# Patient Record
Sex: Male | Born: 1960 | Race: White | Hispanic: No | Marital: Married | Smoking: Former smoker
Health system: Southern US, Community
[De-identification: ages and names within clinical notes are randomized; demographics above are authoritative.]

## PROBLEM LIST (undated history)

## (undated) DIAGNOSIS — E785 Hyperlipidemia, unspecified: Secondary | ICD-10-CM

## (undated) DIAGNOSIS — K5520 Angiodysplasia of colon without hemorrhage: Secondary | ICD-10-CM

## (undated) DIAGNOSIS — D369 Benign neoplasm, unspecified site: Secondary | ICD-10-CM

## (undated) DIAGNOSIS — L4050 Arthropathic psoriasis, unspecified: Secondary | ICD-10-CM

## (undated) DIAGNOSIS — K219 Gastro-esophageal reflux disease without esophagitis: Secondary | ICD-10-CM

## (undated) DIAGNOSIS — I2510 Atherosclerotic heart disease of native coronary artery without angina pectoris: Secondary | ICD-10-CM

## (undated) DIAGNOSIS — N183 Chronic kidney disease, stage 3 unspecified: Secondary | ICD-10-CM

## (undated) DIAGNOSIS — I209 Angina pectoris, unspecified: Secondary | ICD-10-CM

## (undated) DIAGNOSIS — I10 Essential (primary) hypertension: Secondary | ICD-10-CM

## (undated) DIAGNOSIS — E119 Type 2 diabetes mellitus without complications: Secondary | ICD-10-CM

## (undated) HISTORY — DX: Angiodysplasia of colon without hemorrhage: K55.20

## (undated) HISTORY — DX: Benign neoplasm, unspecified site: D36.9

## (undated) HISTORY — DX: Chronic kidney disease, stage 3 unspecified: N18.30

---

## 2015-02-08 ENCOUNTER — Ambulatory Visit: Payer: Self-pay | Admitting: Family Medicine

## 2015-08-11 ENCOUNTER — Emergency Department
Admission: EM | Admit: 2015-08-11 | Discharge: 2015-08-11 | Disposition: A | Discharge status: Home / Self Care | Payer: BLUE CROSS/BLUE SHIELD | Attending: Emergency Medicine | Admitting: Emergency Medicine

## 2015-08-11 DIAGNOSIS — H00011 Hordeolum externum right upper eyelid: Secondary | ICD-10-CM | POA: Insufficient documentation

## 2015-08-11 DIAGNOSIS — H5711 Ocular pain, right eye: Secondary | ICD-10-CM | POA: Diagnosis present

## 2015-08-11 DIAGNOSIS — H00013 Hordeolum externum right eye, unspecified eyelid: Secondary | ICD-10-CM

## 2015-08-11 MED ORDER — KETOROLAC TROMETHAMINE 0.5 % OP SOLN: 1.0000 [drp] | Freq: Four times a day (QID) | OPHTHALMIC | Status: DC

## 2015-08-11 MED ORDER — BACITRACIN-POLYMYXIN B 500-10000 UNIT/GM OP OINT: TOPICAL_OINTMENT | Freq: Two times a day (BID) | OPHTHALMIC | Status: AC

## 2015-08-11 NOTE — ED Notes (Signed)
Right eye red and swollen since Tuesday . Denies fever.

## 2015-08-11 NOTE — ED Notes (Signed)
Pt reports having stye in eye since Tuesday.  Pt sts that he has had stye in past and usually been treatable w/ ointment.

## 2015-08-11 NOTE — ED Provider Notes (Signed)
[facility] Emergency Department Provider Note  ____________________________________________  Time seen: Approximately 10:56 AM  I have reviewed the triage vital signs and the nursing notes.   HISTORY  Chief Complaint Eye Pain    HPI Jesse Ray is a 55 y.o. male who presents emergency department complaining of right upper eyelid redness, swelling, and a "bump on it." Patient states that he has had multiple of these and they have had to be incised by his ophthalmologist. Patient states that he is been using a warm hot compresses with no relief. Patient states that there is mild blurriness to the right eye but no ocular pain. Patient denies any headaches, fevers or chills.   No past medical history on file.  There are no active problems to display for this patient.   No past surgical history on file.  Current Outpatient Rx  Name  Route  Sig  Dispense  Refill  . bacitracin-polymyxin b (POLYSPORIN) ophthalmic ointment   Right Eye   Place into the right eye 2 (two) times daily. Place a 1/2 inch ribbon of ointment into the eyelid.   3.5 g   0   . ketorolac (ACULAR) 0.5 % ophthalmic solution   Right Eye   Place 1 drop into the right eye 4 (four) times daily.   5 mL   0     Allergies Review of patient's allergies indicates no known allergies.  No family history on file.  Social History Social History  Substance Use Topics  . Smoking status: Not on file  . Smokeless tobacco: Not on file  . Alcohol Use: Not on file     Review of Systems  Constitutional: No fever/chills Eyes: No visual changes. No discharge ENT: No sore throat. Erythema and edema of the right upper eyelid Cardiovascular: no chest pain. Respiratory: no cough. No SOB. Gastrointestinal: No abdominal pain.  No nausea, no vomiting.  No diarrhea.  No constipation. Genitourinary: Negative for dysuria. No hematuria Musculoskeletal: Negative for back pain. Skin: Negative for  rash. Neurological: Negative for headaches, focal weakness or numbness. 10-point ROS otherwise negative.  ____________________________________________   PHYSICAL EXAM:  VITAL SIGNS: ED Triage Vitals  Enc Vitals Group     BP --      Pulse --      Resp 08/11/15 1051 18     Temp --      Temp src --      SpO2 --      Weight 08/11/15 1051 182 lb (82.555 kg)     Height 08/11/15 1051 5\' 7"  (1.702 m)     Head Cir --      Peak Flow --      Pain Score 08/11/15 1050 4     Pain Loc --      Pain Edu? --      Excl. in [place]? --      Constitutional: Alert and oriented. Well appearing and in no acute distress. Eyes: Conjunctivae are normal. PERRL. EOMI. funduscopic exam is unremarkable bilaterally. There is erythema and edema noted to the right upper eyelid. Eyelid is examined and stye is appreciated on the anterior surface of the right upper eyelid. Head: Atraumatic. ENT:      Ears:       Nose: No congestion/rhinnorhea.      Mouth/Throat: Mucous membranes are moist.  Neck: No stridor.   Hematological/Lymphatic/Immunilogical: No cervical lymphadenopathy. Cardiovascular: Normal rate, regular rhythm. Normal S1 and S2.  Good peripheral circulation. Respiratory: Normal respiratory  effort without tachypnea or retractions. Lungs CTAB. Gastrointestinal: Soft and nontender. No distention. No CVA tenderness. Musculoskeletal: No lower extremity tenderness nor edema.  No joint effusions. Neurologic:  Normal speech and language. No gross focal neurologic deficits are appreciated.  Skin:  Skin is warm, dry and intact. No rash noted. Psychiatric: Mood and affect are normal. Speech and behavior are normal. Patient exhibits appropriate insight and judgement.   ____________________________________________   LABS (all labs ordered are listed, but only abnormal results are displayed)  Labs Reviewed - No data to  display ____________________________________________  EKG   ____________________________________________  RADIOLOGY   No results found.  ____________________________________________    PROCEDURES  Procedure(s) performed:       Medications - No data to display   ____________________________________________   INITIAL IMPRESSION / ASSESSMENT AND PLAN / ED COURSE  Pertinent labs & imaging results that were available during my care of the patient were reviewed by me and considered in my medical decision making (see chart for details).  Patient's diagnosis is consistent with a stye to the right upper eyelid. Patient will be placed on antibiotic ointment as well as anti-inflammatory eyedrops. This is to continue until he sees his ophthalmologist for potential procedure..  Patient is to follow up with ophthalmology if symptoms persist past this treatment course. Patient is given ED precautions to return to the ED for any worsening or new symptoms.     ____________________________________________  FINAL CLINICAL IMPRESSION(S) / ED DIAGNOSES  Final diagnoses:  Hordeolum externum (stye), right      NEW MEDICATIONS STARTED DURING THIS VISIT:  New Prescriptions   BACITRACIN-POLYMYXIN B (POLYSPORIN) OPHTHALMIC OINTMENT    Place into the right eye 2 (two) times daily. Place a 1/2 inch ribbon of ointment into the eyelid.   KETOROLAC (ACULAR) 0.5 % OPHTHALMIC SOLUTION    Place 1 drop into the right eye 4 (four) times daily.        Charline Bills Cuthriell, PA-C 08/11/15 [place], MD 08/11/15 1537

## 2015-08-11 NOTE — Discharge Instructions (Signed)
Stye A stye is a bump on your eyelid caused by a bacterial infection. A stye can form inside the eyelid (internal stye) or outside the eyelid (external stye). An internal stye may be caused by an infected oil-producing gland inside your eyelid. An external stye may be caused by an infection at the base of your eyelash (hair follicle). Styes are very common. Anyone can get them at any age. They usually occur in just one eye, but you may have more than one in either eye.  CAUSES  The infection is almost always caused by bacteria called Staphylococcus aureus. This is a common type of bacteria that lives on your skin. RISK FACTORS You may be at higher risk for a stye if you have had one before. You may also be at higher risk if you have:  Diabetes.  Long-term illness.  Long-term eye redness.  A skin condition called seborrhea.  High fat levels in your blood (lipids). SIGNS AND SYMPTOMS  Eyelid pain is the most common symptom of a stye. Internal styes are more painful than external styes. Other signs and symptoms may include:  Painful swelling of your eyelid.  A scratchy feeling in your eye.  Tearing and redness of your eye.  Pus draining from the stye. DIAGNOSIS  Your health care provider may be able to diagnose a stye just by examining your eye. The health care provider may also check to make sure:  You do not have a fever or other signs of a more serious infection.  The infection has not spread to other parts of your eye or areas around your eye. TREATMENT  Most styes will clear up in a few days without treatment. In some cases, you may need to use antibiotic drops or ointment to prevent infection. Your health care provider may have to drain the stye surgically if your stye is:  Large.  Causing a lot of pain.  Interfering with your vision. This can be done using a thin blade or a needle.  HOME CARE INSTRUCTIONS   Take medicines only as directed by your health care  provider.  Apply a clean, warm compress to your eye for 10 minutes, 4 times a day.  Do not wear contact lenses or eye makeup until your stye has healed.  Do not try to pop or drain the stye. SEEK MEDICAL CARE IF:  You have chills or a fever.  Your stye does not go away after several days.  Your stye affects your vision.  Your eyeball becomes swollen, red, or painful. MAKE SURE YOU:  Understand these instructions.  Will watch your condition.  Will get help right away if you are not doing well or get worse.   This information is not intended to replace advice given to you by your health care provider. Make sure you discuss any questions you have with your health care provider.   Document Released: 04/08/2005 Document Revised: 07/20/2014 Document Reviewed: 10/13/2013 Elsevier Interactive Patient Education 2016 [employer]  

## 2015-10-24 ENCOUNTER — Ambulatory Visit
Admission: EM | Admit: 2015-10-24 | Discharge: 2015-10-24 | Disposition: A | Discharge status: Home / Self Care | Payer: BLUE CROSS/BLUE SHIELD | Attending: Family Medicine | Admitting: Family Medicine

## 2015-10-24 ENCOUNTER — Encounter: Payer: Self-pay | Admitting: *Deleted

## 2015-10-24 DIAGNOSIS — Z024 Encounter for examination for driving license: Secondary | ICD-10-CM

## 2015-10-24 DIAGNOSIS — Z029 Encounter for administrative examinations, unspecified: Secondary | ICD-10-CM

## 2015-10-24 HISTORY — DX: Essential (primary) hypertension: I10

## 2015-10-24 HISTORY — DX: Type 2 diabetes mellitus without complications: E11.9

## 2015-10-24 HISTORY — DX: Arthropathic psoriasis, unspecified: L40.50

## 2015-10-24 LAB — DEPT OF TRANSP DIPSTICK, URINE (ARMC ONLY)
Glucose, UA: 100 mg/dL — AB
Hgb urine dipstick: NEGATIVE
PROTEIN: 30 mg/dL — AB
Specific Gravity, Urine: 1.025 (ref 1.005–1.030)

## 2015-10-24 NOTE — ED Notes (Signed)
DOT physical. 

## 2015-10-24 NOTE — ED Provider Notes (Signed)
CSN: [id number]     Arrival date & time 10/24/15  [id number] History   First MD Initiated Contact with Patient 10/24/15 [phone]     Chief Complaint  Patient presents with  . 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 He has several problems including diabetes mellitus with his most recent A1c at 8.6% from 432017. He has history of hypertension psoriatic arthritis kidney stones psoriasis. He denies obstructive sleep apnea, heart problems, or cancer.  Past Medical History  Diagnosis Date  . Hypertension   . Diabetes mellitus without complication ([place])   . Psoriatic arthritis ([place])    History reviewed. No pertinent past surgical history. History reviewed. No pertinent family history. Social History  Substance Use Topics  . Smoking status: Never Smoker   . Smokeless tobacco: None  . Alcohol Use: Yes    Review of Systems  All other systems reviewed and are negative.   Allergies  Review of patient's allergies indicates no known allergies.  Home Medications   Prior to Admission medications   Medication Sig Start Date End Date Taking? Authorizing Provider  Adalimumab (HUMIRA PEN-PSORIASIS STARTER) 40 MG/0.8ML PNKT Inject into the skin.   Yes Historical Provider, MD  ALBUTEROL SULFATE HFA IN Inhale into the lungs.   Yes Historical Provider, MD  amLODipine-valsartan (EXFORGE) 10-320 MG tablet Take 1 tablet by mouth daily.   Yes Historical Provider, MD  aspirin 81 MG tablet Take 81 mg by mouth daily.   Yes Historical Provider, MD  budesonide-formoterol (SYMBICORT) 160-4.5 MCG/ACT inhaler Inhale 2 puffs into the lungs 2 (two) times daily.   Yes Historical Provider, MD  celecoxib (CELEBREX) 200 MG capsule Take 200 mg by mouth 2 (two) times daily.   Yes Historical Provider, MD  glimepiride (AMARYL) 4 MG tablet Take 4 mg by  mouth daily with breakfast.   Yes Historical Provider, MD  Liraglutide (VICTOZA) 18 MG/3ML SOPN Inject into the skin.   Yes Historical Provider, MD  metFORMIN (GLUMETZA) 500 MG (MOD) 24 hr tablet Take 500 mg by mouth daily with breakfast.   Yes Historical Provider, MD  ketorolac (ACULAR) 0.5 % ophthalmic solution Place 1 drop into the right eye 4 (four) times daily. 08/11/15   Charline Bills Cuthriell, PA-C   Meds Ordered and Administered this Visit  Medications - No data to display  BP 124/79 mmHg  Pulse 89  Temp(Src) 97.6 F (36.4 C) (Oral)  Resp 16  Ht 5\' 7"  (1.702 m)  Wt 175 lb (79.379 kg)  BMI 27.40 kg/m2  SpO2 100% No data found.   Physical Exam  Constitutional:  Referred to the DOT physical sheet  Nursing note and vitals reviewed.   ED Course  Procedures (including critical care time)  Labs Review Labs Reviewed  DEPT OF TRANSP DIPSTICK, URINE([facility] ONLY) - Abnormal; Notable for the following:    Protein, ur 30 (*)    Glucose, UA 100 (*)    All other components within normal limits    Imaging Review No results found.   Visual Acuity Review  Right Eye Distance:   Left Eye Distance:   Bilateral Distance:    Right Eye Near:   Left Eye Near:    Bilateral Near:         MDM   1. Driver's permit physical examination        Lorin Picket, Hershal Coria 10/24/15 [id number]

## 2018-11-18 DIAGNOSIS — E1121 Type 2 diabetes mellitus with diabetic nephropathy: Secondary | ICD-10-CM | POA: Insufficient documentation

## 2018-11-18 DIAGNOSIS — IMO0002 Reserved for concepts with insufficient information to code with codable children: Secondary | ICD-10-CM | POA: Insufficient documentation

## 2019-09-15 ENCOUNTER — Ambulatory Visit: Payer: Self-pay

## 2019-09-22 ENCOUNTER — Other Ambulatory Visit: Payer: Self-pay

## 2019-09-22 ENCOUNTER — Ambulatory Visit: Payer: BC Managed Care – PPO | Attending: Internal Medicine

## 2019-09-22 DIAGNOSIS — Z23 Encounter for immunization: Secondary | ICD-10-CM

## 2019-09-22 NOTE — Progress Notes (Signed)
   Covid-19 Vaccination Clinic  Name:  Jesse Ray    MRN: [id number] DOB: Apr 05, 1961  09/22/2019  Jesse Ray was observed post Covid-19 immunization for 15 minutes without incident. He was provided with Vaccine Information Sheet and instruction to access the V-Safe system.   Jesse Ray was instructed to call 911 with any severe reactions post vaccine: [name] Difficulty breathing  . Swelling of face and throat  . A fast heartbeat  . A bad rash all over body  . Dizziness and weakness   Immunizations Administered    Name Date Dose VIS Date Route   Moderna COVID-19 Vaccine 09/22/2019  8:35 AM 0.5 mL 06/13/2019 Intramuscular   Manufacturer: Moderna   Lot: [id number]   [place]: 02111-552-08

## 2019-10-25 ENCOUNTER — Ambulatory Visit: Payer: BC Managed Care – PPO | Attending: Internal Medicine

## 2019-10-25 DIAGNOSIS — Z23 Encounter for immunization: Secondary | ICD-10-CM

## 2019-10-25 NOTE — Progress Notes (Signed)
   Covid-19 Vaccination Clinic  Name:  Kary Colaizzi    MRN: [id number] DOB: 26-Feb-1961  10/25/2019  Mr. Tengan was observed post Covid-19 immunization for 15 minutes without incident. He was provided with Vaccine Information Sheet and instruction to access the V-Safe system.   Mr. Trickel was instructed to call 911 with any severe reactions post vaccine: [name] Difficulty breathing  . Swelling of face and throat  . A fast heartbeat  . A bad rash all over body  . Dizziness and weakness   Immunizations Administered    Name Date Dose VIS Date Route   Moderna COVID-19 Vaccine 10/25/2019  8:10 AM 0.5 mL 06/13/2019 Intramuscular   Manufacturer: Moderna   Lot: [id number]   [place]: [id number]

## 2020-03-04 ENCOUNTER — Other Ambulatory Visit: Payer: Self-pay | Admitting: Rheumatology

## 2020-03-04 DIAGNOSIS — M25512 Pain in left shoulder: Secondary | ICD-10-CM

## 2020-03-22 ENCOUNTER — Other Ambulatory Visit: Payer: Self-pay

## 2020-03-22 ENCOUNTER — Ambulatory Visit
Admission: RE | Admit: 2020-03-22 | Discharge: 2020-03-22 | Disposition: A | Discharge status: Home / Self Care | Payer: Self-pay | Source: Ambulatory Visit | Attending: Cardiovascular Disease | Admitting: Cardiovascular Disease

## 2020-03-22 DIAGNOSIS — Z8249 Family history of ischemic heart disease and other diseases of the circulatory system: Secondary | ICD-10-CM

## 2020-03-22 NOTE — Progress Notes (Signed)
Ok to order per Dr. Rockey Situ. Patients wife sees Dr. Rockey Situ.

## 2020-03-25 ENCOUNTER — Telehealth: Payer: Self-pay | Admitting: Cardiology

## 2020-03-25 DIAGNOSIS — R931 Abnormal findings on diagnostic imaging of heart and coronary circulation: Secondary | ICD-10-CM

## 2020-03-25 DIAGNOSIS — Z8249 Family history of ischemic heart disease and other diseases of the circulatory system: Secondary | ICD-10-CM

## 2020-03-25 NOTE — Telephone Encounter (Signed)
Patients wife calling in regarding the Cardiac Ct results  Please advise

## 2020-03-27 NOTE — Telephone Encounter (Signed)
Spoke with patient and relayed Dr. Donivan Scull result note. Patient stated that he is currently on a statin and baby ASA. He was not sure which statin as he was at work. He thought it started with an R. I mentioned rosuvastatin and he thought that was it. 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 Rockey Situ did not have anything available for another month. Patient wanted to be seen sooner. I scheduled him with Dr. Garen Lah for next week at 0800. I also added a lipid panel to be drawn as the patient had other labs to be drawn next week for his diabetes.

## 2020-03-27 NOTE — Telephone Encounter (Signed)
-----   Message from Minna Merritts, MD sent at 03/27/2020 11:40 AM EDT ----- Coronary calcium score Very high score recorded of 1400, 99th percentile based on his age Calcification noted in all 3 coronary arteries and aorta He can come in for cardiology consultation or talk with primary care, needs aggressive management with low-dose aspirin and statin to achieve goal LDL less than 70 Needs sugars down, A1c 6 or less

## 2020-03-28 ENCOUNTER — Other Ambulatory Visit: Payer: Self-pay

## 2020-03-28 ENCOUNTER — Ambulatory Visit (INDEPENDENT_AMBULATORY_CARE_PROVIDER_SITE_OTHER): Payer: BC Managed Care – PPO | Admitting: Cardiology

## 2020-03-28 ENCOUNTER — Other Ambulatory Visit
Admission: RE | Admit: 2020-03-28 | Discharge: 2020-03-28 | Disposition: A | Discharge status: Home / Self Care | Payer: BC Managed Care – PPO | Source: Ambulatory Visit | Attending: Cardiology | Admitting: Cardiology

## 2020-03-28 ENCOUNTER — Encounter: Payer: Self-pay | Admitting: Cardiology

## 2020-03-28 VITALS — BP 122/74 | HR 82 | Ht 67.0 in | Wt 170.2 lb

## 2020-03-28 DIAGNOSIS — Z87442 Personal history of urinary calculi: Secondary | ICD-10-CM | POA: Insufficient documentation

## 2020-03-28 DIAGNOSIS — L4050 Arthropathic psoriasis, unspecified: Secondary | ICD-10-CM | POA: Insufficient documentation

## 2020-03-28 DIAGNOSIS — I10 Essential (primary) hypertension: Secondary | ICD-10-CM

## 2020-03-28 DIAGNOSIS — Z01812 Encounter for preprocedural laboratory examination: Secondary | ICD-10-CM | POA: Insufficient documentation

## 2020-03-28 DIAGNOSIS — I25118 Atherosclerotic heart disease of native coronary artery with other forms of angina pectoris: Secondary | ICD-10-CM | POA: Diagnosis not present

## 2020-03-28 DIAGNOSIS — T7840XA Allergy, unspecified, initial encounter: Secondary | ICD-10-CM | POA: Insufficient documentation

## 2020-03-28 DIAGNOSIS — Z20822 Contact with and (suspected) exposure to covid-19: Secondary | ICD-10-CM | POA: Insufficient documentation

## 2020-03-28 DIAGNOSIS — E7800 Pure hypercholesterolemia, unspecified: Secondary | ICD-10-CM

## 2020-03-28 DIAGNOSIS — J45909 Unspecified asthma, uncomplicated: Secondary | ICD-10-CM | POA: Insufficient documentation

## 2020-03-28 MED ORDER — ROSUVASTATIN CALCIUM 20 MG PO TABS: 20.0000 mg | ORAL_TABLET | Freq: Every day | ORAL | 5 refills | Status: DC

## 2020-03-28 NOTE — Progress Notes (Signed)
Cardiology Office Note:    Date:  03/28/2020   ID:  Dwan Bolt, DOB 1961-02-04, MRN [id number]  PCP:  Adin Hector, MD  [facility] Cardiologist:  No primary care provider on file.  [facility] Electrophysiologist:  None   Referring MD: No ref. provider found   Chief Complaint  Patient presents with  . New Patient (Initial Visit)    Establish care with provider for elevated coronary calcium score. Medications verbally reviewed with patient.     History of Present Illness:    Jesse Ray is a 59 y.o. male with a hx of diabetes, hypertension, hyperlipidemia who presents due to abnormal coronary calcium score.  Patient underwent coronary calcium score on 03/2020 for restratification.  His score was noted to be elevated at 1424, which was 99th percentile for age and gender matched controls (MESA data).  Patient states having symptoms of chest pressure, which is rates as 3/10, over the past month associated with exertion such as mowing his lawn or working in his yard.  Symptoms resolved with rest.  Also endorses shortness of breath with exertion during this period.  His father had a heart attack age 46.  Past Medical History:  Diagnosis Date  . Diabetes mellitus without complication ([place])   . Hypertension   . Psoriatic arthritis ([place])     History reviewed. No pertinent surgical history.  Current Medications: Current Meds  Medication Sig  . Adalimumab (HUMIRA PEN-PSORIASIS STARTER) 40 MG/0.8ML PNKT Inject 0.4 mLs into the skin every 14 (fourteen) days.   [name] albuterol (VENTOLIN HFA) 108 (90 Base) MCG/ACT inhaler Inhale 2 puffs into the lungs every 6 (six) hours as needed for wheezing or shortness of breath.   [name] ascorbic acid (VITAMIN C) 1000 MG tablet Take 1,000 mg by mouth daily.   [name] aspirin 81 MG tablet Take 81 mg by mouth daily.  . budesonide-formoterol (SYMBICORT) 160-4.5 MCG/ACT inhaler Inhale 2 puffs into the lungs daily as needed (Shotness of breah or  wheezing).   . celecoxib (CELEBREX) 200 MG capsule Take 200 mg by mouth daily.   . Cholecalciferol 25 MCG (1000 UT) tablet Take 1,000 Units by mouth daily.   . clobetasol cream (TEMOVATE) 8.89 % Apply 1 application topically 2 (two) times daily as needed (Psoriasis).   [name] exenatide (BYETTA 10 MCG PEN) 10 MCG/0.04ML SOPN injection Inject into the skin 2 (two) times daily with a meal. 2.4 ml  . Ginkgo Biloba 60 MG CAPS Take 60 mg by mouth in the morning and at bedtime.   [name] glimepiride (AMARYL) 4 MG tablet Take 4 mg by mouth daily with breakfast.  . metFORMIN (GLUCOPHAGE) 1000 MG tablet Take 1,000 mg by mouth 2 (two) times daily with a meal.   . rosuvastatin (CRESTOR) 20 MG tablet Take 1 tablet (20 mg total) by mouth daily.  [name] triamcinolone (NASACORT) 55 MCG/ACT AERO nasal inhaler Place 1 spray into the nose daily as needed (Congestion).   . vitamin E 180 MG (400 UNITS) capsule Take 400 Units by mouth daily.   . Zinc 30 MG TABS Take 30 mg by mouth daily.   . [DISCONTINUED] amLODipine (NORVASC) 10 MG tablet Take by mouth.  . [DISCONTINUED] rosuvastatin (CRESTOR) 20 MG tablet Take by mouth.  . [DISCONTINUED] valsartan (DIOVAN) 320 MG tablet Take by mouth.     Allergies:   Atorvastatin   Social History   Socioeconomic History  . Marital status: Married    Spouse name: Not on file  .  Number of children: Not on file  . Years of education: Not on file  . Highest education level: Not on file  Occupational History  . Not on file  Tobacco Use  . Smoking status: Former Research scientist (life sciences)  . Smokeless tobacco: Never Used  Substance and Sexual Activity  . Alcohol use: Yes  . Drug use: Not on file  . Sexual activity: Not on file  Other Topics Concern  . Not on file  Social History Narrative  . Not on file   Social Determinants of Health   Financial Resource Strain:   . Difficulty of Paying Living Expenses: Not on file  Food Insecurity:   . Worried About Charity fundraiser in the Last Year: Not on  file  . Ran Out of Food in the Last Year: Not on file  Transportation Needs:   . Lack of Transportation (Medical): Not on file  . Lack of Transportation (Non-Medical): Not on file  Physical Activity:   . Days of Exercise per Week: Not on file  . Minutes of Exercise per Session: Not on file  Stress:   . Feeling of Stress : Not on file  Social Connections:   . Frequency of Communication with Friends and Family: Not on file  . Frequency of Social Gatherings with Friends and Family: Not on file  . Attends Religious Services: Not on file  . Active Member of Clubs or Organizations: Not on file  . Attends Archivist Meetings: Not on file  . Marital Status: Not on file     Family History: Father heart and MI at age 2.  ROS:   Please see the history of present illness.     All other systems reviewed and are negative.  EKGs/Labs/Other Studies Reviewed:    The following studies were reviewed today:   EKG:  EKG is  ordered today.  The ekg ordered today demonstrates normal sinus rhythm, normal ECG.  Recent Labs: No results found for requested labs within last 8760 hours.  Recent Lipid Panel No results found for: CHOL, TRIG, HDL, CHOLHDL, VLDL, LDLCALC, LDLDIRECT  Physical Exam:    VS:  BP 122/74 (BP Location: Right Arm, Patient Position: Sitting, Cuff Size: Normal)   Pulse 82   Ht 5\' 7"  (1.702 m)   Wt 170 lb 3.2 oz (77.2 kg)   SpO2 98%   BMI 26.66 kg/m     Wt Readings from Last 3 Encounters:  03/28/20 170 lb 3.2 oz (77.2 kg)  10/24/15 175 lb (79.4 kg)  08/11/15 184 lb (83.5 kg)     GEN:  Well nourished, well developed in no acute distress HEENT: Normal NECK: No JVD; No carotid bruits LYMPHATICS: No lymphadenopathy CARDIAC: RRR, no murmurs, rubs, gallops RESPIRATORY:  Clear to auscultation without rales, wheezing or rhonchi  ABDOMEN: Soft, non-tender, non-distended MUSCULOSKELETAL:  No edema; No deformity  SKIN: Warm and dry NEUROLOGIC:  Alert and oriented  x 3 PSYCHIATRIC:  Normal affect   ASSESSMENT:    1. Coronary artery disease of native artery of native heart with stable angina pectoris ([place])   2. Essential hypertension   3. Pure hypercholesterolemia    PLAN:    In order of problems listed above:  1. Patient with symptoms consistent with angina.  Has a recent coronary calcium scan with a score of 1424, calcifications involving all 3 coronary arteries.  Has risk factors of hypertension, diabetes, family history of early CAD.  Patient is high cardiac risk, and a stress  test/noninvasive test if negative will not change management plan and if positive only confirms pretest probability, which is high.  We will schedule patient for left heart cath.  Get echocardiogram.  Continue aspirin 81 mg, start Crestor 20 mg daily. 2. History of hypertension, BP controlled, continue amlodipine, valsartan. 3. History of hyperlipidemia, get fasting lipid profile.  Start Crestor 20 mg as above.  Follow-up after echo and left heart cath.  Total encounter time 65 minutes  Greater than 50% was spent in counseling and coordination of care with the patient    Medication Adjustments/Labs and Tests Ordered: Current medicines are reviewed at length with the patient today.  Concerns regarding medicines are outlined above.  Orders Placed This Encounter  Procedures  . Lipid panel  . CBC  . Basic metabolic panel  . ECHOCARDIOGRAM COMPLETE   Meds ordered this encounter  Medications  . rosuvastatin (CRESTOR) 20 MG tablet    Sig: Take 1 tablet (20 mg total) by mouth daily.    Dispense:  30 tablet    Refill:  5    Patient Instructions  Medication Instructions:  Your physician has recommended you make the following change in your medication:  START taking rosuvastatin (CRESTOR) 20 MG tablet: Take 1 tablet (20 mg total) by mouth daily.  *If you need a refill on your cardiac medications before your next appointment, please call your pharmacy*   Lab  Work: Today you had a fasting lipid, CBC, and a BMP drawn. You will also need to get a COVID test done at the drive through in front of the [facility]. Hours are 8am-1pm.  If you have labs (blood work) drawn today and your tests are completely normal, you will receive your results only by: [name] MyChart Message (if you have MyChart) OR . A paper copy in the mail If you have any lab test that is abnormal or we need to change your treatment, we will call you to review the results.   Testing/Procedures:  1.  Your physician has requested that you have an echocardiogram. Echocardiography is a painless test that uses sound waves to create images of your heart. It provides your doctor with information about the size and shape of your heart and how well your heart's chambers and valves are working. This procedure takes approximately one hour. There are no restrictions for this procedure.  2. [facility] Cardiac Cath Instructions     You are scheduled for a Cardiac Cath on:__________Mon. 9/20_______________  Please arrive at __0930___am on the day of your procedure  Please expect a call from our [place] to pre-register you  Do not eat/drink anything after midnight  Someone will need to drive you home  It is recommended someone be with you for the first 24 hours after your procedure  Wear clothes that are easy to get on/off and wear slip on shoes if possible   Medications bring a current list of all medications with you.   _XX__ Do not take these medications before your procedure: valsartan (DIOVAN), glimepiride (AMARYL) 4 MG tablet  _XX__ Do not take your metFORMIN (GLUCOPHAGE) 24 hours prior, and 48 hours after your procedure.  Day of your procedure:  Arrive at the [facility] entrance.  Free valet service is available.  After entering the [place] please check-in at the registration desk (1st desk on your right) to receive your armband. After receiving  your armband someone will escort you to the cardiac cath/special procedures waiting area.  The usual length of stay after your procedure is about 2 to 3 hours.  This can vary.  If you have any questions, please call our office at [phone], or you may call the cardiac cath lab at [facility] directly at [phone]    Follow-Up: At [facility], you and your health needs are our priority.  As part of our continuing mission to provide you with exceptional heart care, we have created designated Provider Care Teams.  These Care Teams include your primary Cardiologist (physician) and Advanced Practice Providers (APPs -  Physician Assistants and Nurse Practitioners) who all work together to provide you with the care you need, when you need it.  We recommend signing up for the patient portal called "MyChart".  Sign up information is provided on this After Visit Summary.  MyChart is used to connect with patients for Virtual Visits (Telemedicine).  Patients are able to view lab/test results, encounter notes, upcoming appointments, etc.  Non-urgent messages can be sent to your provider as well.   To learn more about what you can do with MyChart, go to [email or web address].    Your next appointment:   Follow up after Echo and Cath lab procedure   The format for your next appointment:   In Person  Provider:   Kate Sable, MD   Other Instructions      Signed, Kate Sable, MD  03/28/2020 12:57 PM    [place]

## 2020-03-28 NOTE — Patient Instructions (Signed)
Medication Instructions:  Your physician has recommended you make the following change in your medication:  START taking rosuvastatin (CRESTOR) 20 MG tablet: Take 1 tablet (20 mg total) by mouth daily.  *If you need a refill on your cardiac medications before your next appointment, please call your pharmacy*   Lab Work: Today you had a fasting lipid, CBC, and a BMP drawn. You will also need to get a COVID test done at the drive through in front of the [facility]. Hours are 8am-1pm.  If you have labs (blood work) drawn today and your tests are completely normal, you will receive your results only by: [name] MyChart Message (if you have MyChart) OR . A paper copy in the mail If you have any lab test that is abnormal or we need to change your treatment, we will call you to review the results.   Testing/Procedures:  1.  Your physician has requested that you have an echocardiogram. Echocardiography is a painless test that uses sound waves to create images of your heart. It provides your doctor with information about the size and shape of your heart and how well your heart's chambers and valves are working. This procedure takes approximately one hour. There are no restrictions for this procedure.  2. [facility] Cardiac Cath Instructions     You are scheduled for a Cardiac Cath on:__________Mon. 9/20_______________  Please arrive at __0930___am on the day of your procedure  Please expect a call from our [place] to pre-register you  Do not eat/drink anything after midnight  Someone will need to drive you home  It is recommended someone be with you for the first 24 hours after your procedure  Wear clothes that are easy to get on/off and wear slip on shoes if possible   Medications bring a current list of all medications with you.   _XX__ Do not take these medications before your procedure: valsartan (DIOVAN), glimepiride (AMARYL) 4 MG tablet  _XX__ Do not  take your metFORMIN (GLUCOPHAGE) 24 hours prior, and 48 hours after your procedure.  Day of your procedure:  Arrive at the [facility] entrance.  Free valet service is available.  After entering the [place] please check-in at the registration desk (1st desk on your right) to receive your armband. After receiving your armband someone will escort you to the cardiac cath/special procedures waiting area.  The usual length of stay after your procedure is about 2 to 3 hours.  This can vary.  If you have any questions, please call our office at [phone], or you may call the cardiac cath lab at [facility] directly at [phone]    Follow-Up: At [facility], you and your health needs are our priority.  As part of our continuing mission to provide you with exceptional heart care, we have created designated Provider Care Teams.  These Care Teams include your primary Cardiologist (physician) and Advanced Practice Providers (APPs -  Physician Assistants and Nurse Practitioners) who all work together to provide you with the care you need, when you need it.  We recommend signing up for the patient portal called "MyChart".  Sign up information is provided on this After Visit Summary.  MyChart is used to connect with patients for Virtual Visits (Telemedicine).  Patients are able to view lab/test results, encounter notes, upcoming appointments, etc.  Non-urgent messages can be sent to your provider as well.   To learn more about what you can do with MyChart, go to [email or web address].  Your next appointment:   Follow up after Echo and Cath lab procedure   The format for your next appointment:   In Person  Provider:   Kate Sable, MD   Other Instructions

## 2020-03-28 NOTE — H&P (View-Only) (Signed)
Cardiology Office Note:    Date:  03/28/2020   ID:  Dwan Bolt, DOB 03-10-1961, MRN [id number]  PCP:  Adin Hector, MD  [place] Cardiologist:  No primary care provider on file.  [facility] Electrophysiologist:  None   Referring MD: No ref. provider found   Chief Complaint  Patient presents with  . New Patient (Initial Visit)    Establish care with provider for elevated coronary calcium score. Medications verbally reviewed with patient.     History of Present Illness:    Jesse Ray is a 59 y.o. male with a hx of diabetes, hypertension, hyperlipidemia who presents due to abnormal coronary calcium score.  Patient underwent coronary calcium score on 03/2020 for restratification.  His score was noted to be elevated at 1424, which was 99th percentile for age and gender matched controls (MESA data).  Patient states having symptoms of chest pressure, which is rates as 3/10, over the past month associated with exertion such as mowing his lawn or working in his yard.  Symptoms resolved with rest.  Also endorses shortness of breath with exertion during this period.  His father had a heart attack age 80.  Past Medical History:  Diagnosis Date  . Diabetes mellitus without complication ([place])   . Hypertension   . Psoriatic arthritis ([place])     History reviewed. No pertinent surgical history.  Current Medications: Current Meds  Medication Sig  . Adalimumab (HUMIRA PEN-PSORIASIS STARTER) 40 MG/0.8ML PNKT Inject 0.4 mLs into the skin every 14 (fourteen) days.   [name] albuterol (VENTOLIN HFA) 108 (90 Base) MCG/ACT inhaler Inhale 2 puffs into the lungs every 6 (six) hours as needed for wheezing or shortness of breath.   [name] ascorbic acid (VITAMIN C) 1000 MG tablet Take 1,000 mg by mouth daily.   [name] aspirin 81 MG tablet Take 81 mg by mouth daily.  . budesonide-formoterol (SYMBICORT) 160-4.5 MCG/ACT inhaler Inhale 2 puffs into the lungs daily as needed (Shotness of breah or  wheezing).   . celecoxib (CELEBREX) 200 MG capsule Take 200 mg by mouth daily.   . Cholecalciferol 25 MCG (1000 UT) tablet Take 1,000 Units by mouth daily.   . clobetasol cream (TEMOVATE) 5.10 % Apply 1 application topically 2 (two) times daily as needed (Psoriasis).   [name] exenatide (BYETTA 10 MCG PEN) 10 MCG/0.04ML SOPN injection Inject into the skin 2 (two) times daily with a meal. 2.4 ml  . Ginkgo Biloba 60 MG CAPS Take 60 mg by mouth in the morning and at bedtime.   [name] glimepiride (AMARYL) 4 MG tablet Take 4 mg by mouth daily with breakfast.  . metFORMIN (GLUCOPHAGE) 1000 MG tablet Take 1,000 mg by mouth 2 (two) times daily with a meal.   . rosuvastatin (CRESTOR) 20 MG tablet Take 1 tablet (20 mg total) by mouth daily.  [name] triamcinolone (NASACORT) 55 MCG/ACT AERO nasal inhaler Place 1 spray into the nose daily as needed (Congestion).   . vitamin E 180 MG (400 UNITS) capsule Take 400 Units by mouth daily.   . Zinc 30 MG TABS Take 30 mg by mouth daily.   . [DISCONTINUED] amLODipine (NORVASC) 10 MG tablet Take by mouth.  . [DISCONTINUED] rosuvastatin (CRESTOR) 20 MG tablet Take by mouth.  . [DISCONTINUED] valsartan (DIOVAN) 320 MG tablet Take by mouth.     Allergies:   Atorvastatin   Social History   Socioeconomic History  . Marital status: Married    Spouse name: Not on file  .  Number of children: Not on file  . Years of education: Not on file  . Highest education level: Not on file  Occupational History  . Not on file  Tobacco Use  . Smoking status: Former Research scientist (life sciences)  . Smokeless tobacco: Never Used  Substance and Sexual Activity  . Alcohol use: Yes  . Drug use: Not on file  . Sexual activity: Not on file  Other Topics Concern  . Not on file  Social History Narrative  . Not on file   Social Determinants of Health   Financial Resource Strain:   . Difficulty of Paying Living Expenses: Not on file  Food Insecurity:   . Worried About Charity fundraiser in the Last Year: Not on  file  . Ran Out of Food in the Last Year: Not on file  Transportation Needs:   . Lack of Transportation (Medical): Not on file  . Lack of Transportation (Non-Medical): Not on file  Physical Activity:   . Days of Exercise per Week: Not on file  . Minutes of Exercise per Session: Not on file  Stress:   . Feeling of Stress : Not on file  Social Connections:   . Frequency of Communication with Friends and Family: Not on file  . Frequency of Social Gatherings with Friends and Family: Not on file  . Attends Religious Services: Not on file  . Active Member of Clubs or Organizations: Not on file  . Attends Archivist Meetings: Not on file  . Marital Status: Not on file     Family History: Father heart and MI at age 32.  ROS:   Please see the history of present illness.     All other systems reviewed and are negative.  EKGs/Labs/Other Studies Reviewed:    The following studies were reviewed today:   EKG:  EKG is  ordered today.  The ekg ordered today demonstrates normal sinus rhythm, normal ECG.  Recent Labs: No results found for requested labs within last 8760 hours.  Recent Lipid Panel No results found for: CHOL, TRIG, HDL, CHOLHDL, VLDL, LDLCALC, LDLDIRECT  Physical Exam:    VS:  BP 122/74 (BP Location: Right Arm, Patient Position: Sitting, Cuff Size: Normal)   Pulse 82   Ht 5\' 7"  (1.702 m)   Wt 170 lb 3.2 oz (77.2 kg)   SpO2 98%   BMI 26.66 kg/m     Wt Readings from Last 3 Encounters:  03/28/20 170 lb 3.2 oz (77.2 kg)  10/24/15 175 lb (79.4 kg)  08/11/15 184 lb (83.5 kg)     GEN:  Well nourished, well developed in no acute distress HEENT: Normal NECK: No JVD; No carotid bruits LYMPHATICS: No lymphadenopathy CARDIAC: RRR, no murmurs, rubs, gallops RESPIRATORY:  Clear to auscultation without rales, wheezing or rhonchi  ABDOMEN: Soft, non-tender, non-distended MUSCULOSKELETAL:  No edema; No deformity  SKIN: Warm and dry NEUROLOGIC:  Alert and oriented  x 3 PSYCHIATRIC:  Normal affect   ASSESSMENT:    1. Coronary artery disease of native artery of native heart with stable angina pectoris ([place])   2. Essential hypertension   3. Pure hypercholesterolemia    PLAN:    In order of problems listed above:  1. Patient with symptoms consistent with angina.  Has a recent coronary calcium scan with a score of 1424, calcifications involving all 3 coronary arteries.  Has risk factors of hypertension, diabetes, family history of early CAD.  Patient is high cardiac risk, and a stress  test/noninvasive test if negative will not change management plan and if positive only confirms pretest probability, which is high.  We will schedule patient for left heart cath.  Get echocardiogram.  Continue aspirin 81 mg, start Crestor 20 mg daily. 2. History of hypertension, BP controlled, continue amlodipine, valsartan. 3. History of hyperlipidemia, get fasting lipid profile.  Start Crestor 20 mg as above.  Follow-up after echo and left heart cath.  Total encounter time 65 minutes  Greater than 50% was spent in counseling and coordination of care with the patient    Medication Adjustments/Labs and Tests Ordered: Current medicines are reviewed at length with the patient today.  Concerns regarding medicines are outlined above.  Orders Placed This Encounter  Procedures  . Lipid panel  . CBC  . Basic metabolic panel  . ECHOCARDIOGRAM COMPLETE   Meds ordered this encounter  Medications  . rosuvastatin (CRESTOR) 20 MG tablet    Sig: Take 1 tablet (20 mg total) by mouth daily.    Dispense:  30 tablet    Refill:  5    Patient Instructions  Medication Instructions:  Your physician has recommended you make the following change in your medication:  START taking rosuvastatin (CRESTOR) 20 MG tablet: Take 1 tablet (20 mg total) by mouth daily.  *If you need a refill on your cardiac medications before your next appointment, please call your pharmacy*   Lab  Work: Today you had a fasting lipid, CBC, and a BMP drawn. You will also need to get a COVID test done at the drive through in front of [facility]. Hours are 8am-1pm.  If you have labs (blood work) drawn today and your tests are completely normal, you will receive your results only by: [name] MyChart Message (if you have MyChart) OR . A paper copy in the mail If you have any lab test that is abnormal or we need to change your treatment, we will call you to review the results.   Testing/Procedures:  1.  Your physician has requested that you have an echocardiogram. Echocardiography is a painless test that uses sound waves to create images of your heart. It provides your doctor with information about the size and shape of your heart and how well your heart's chambers and valves are working. This procedure takes approximately one hour. There are no restrictions for this procedure.  2. [facility] Cardiac Cath Instructions     You are scheduled for a Cardiac Cath on:__________Mon. 9/20_______________  Please arrive at __0930___am on the day of your procedure  Please expect a call from our [place] to pre-register you  Do not eat/drink anything after midnight  Someone will need to drive you home  It is recommended someone be with you for the first 24 hours after your procedure  Wear clothes that are easy to get on/off and wear slip on shoes if possible   Medications bring a current list of all medications with you.   _XX__ Do not take these medications before your procedure: valsartan (DIOVAN), glimepiride (AMARYL) 4 MG tablet  _XX__ Do not take your metFORMIN (GLUCOPHAGE) 24 hours prior, and 48 hours after your procedure.  Day of your procedure:  Arrive at the [facility] entrance.  Free valet service is available.  After entering the [place] please check-in at the registration desk (1st desk on your right) to receive your armband. After receiving  your armband someone will escort you to the cardiac cath/special procedures waiting area.  The usual length of stay after your procedure is about 2 to 3 hours.  This can vary.  If you have any questions, please call our office at [phone], or you may call the cardiac cath lab at [facility] directly at [phone]    Follow-Up: At [facility], you and your health needs are our priority.  As part of our continuing mission to provide you with exceptional heart care, we have created designated Provider Care Teams.  These Care Teams include your primary Cardiologist (physician) and Advanced Practice Providers (APPs -  Physician Assistants and Nurse Practitioners) who all work together to provide you with the care you need, when you need it.  We recommend signing up for the patient portal called "MyChart".  Sign up information is provided on this After Visit Summary.  MyChart is used to connect with patients for Virtual Visits (Telemedicine).  Patients are able to view lab/test results, encounter notes, upcoming appointments, etc.  Non-urgent messages can be sent to your provider as well.   To learn more about what you can do with MyChart, go to [email or web address].    Your next appointment:   Follow up after Echo and Cath lab procedure   The format for your next appointment:   In Person  Provider:   Kate Sable, MD   Other Instructions      Signed, Kate Sable, MD  03/28/2020 12:57 PM    [place]

## 2020-03-28 NOTE — H&P (View-Only) (Signed)
Cardiology Office Note:    Date:  03/28/2020   ID:  Jesse Ray, DOB 12/03/1960, MRN [id number]  PCP:  Adin Hector, MD  [place] Cardiologist:  No primary care provider on file.  [facility] Electrophysiologist:  None   Referring MD: No ref. provider found   Chief Complaint  Patient presents with  . New Patient (Initial Visit)    Establish care with provider for elevated coronary calcium score. Medications verbally reviewed with patient.     History of Present Illness:    Jesse Ray is a 59 y.o. male with a hx of diabetes, hypertension, hyperlipidemia who presents due to abnormal coronary calcium score.  Patient underwent coronary calcium score on 03/2020 for restratification.  His score was noted to be elevated at 1424, which was 99th percentile for age and gender matched controls (MESA data).  Patient states having symptoms of chest pressure, which is rates as 3/10, over the past month associated with exertion such as mowing his lawn or working in his yard.  Symptoms resolved with rest.  Also endorses shortness of breath with exertion during this period.  His father had a heart attack age 54.  Past Medical History:  Diagnosis Date  . Diabetes mellitus without complication ([place])   . Hypertension   . Psoriatic arthritis ([place])     History reviewed. No pertinent surgical history.  Current Medications: Current Meds  Medication Sig  . Adalimumab (HUMIRA PEN-PSORIASIS STARTER) 40 MG/0.8ML PNKT Inject 0.4 mLs into the skin every 14 (fourteen) days.   [name] albuterol (VENTOLIN HFA) 108 (90 Base) MCG/ACT inhaler Inhale 2 puffs into the lungs every 6 (six) hours as needed for wheezing or shortness of breath.   [name] ascorbic acid (VITAMIN C) 1000 MG tablet Take 1,000 mg by mouth daily.   [name] aspirin 81 MG tablet Take 81 mg by mouth daily.  . budesonide-formoterol (SYMBICORT) 160-4.5 MCG/ACT inhaler Inhale 2 puffs into the lungs daily as needed (Shotness of breah or  wheezing).   . celecoxib (CELEBREX) 200 MG capsule Take 200 mg by mouth daily.   . Cholecalciferol 25 MCG (1000 UT) tablet Take 1,000 Units by mouth daily.   . clobetasol cream (TEMOVATE) 8.46 % Apply 1 application topically 2 (two) times daily as needed (Psoriasis).   [name] exenatide (BYETTA 10 MCG PEN) 10 MCG/0.04ML SOPN injection Inject into the skin 2 (two) times daily with a meal. 2.4 ml  . Ginkgo Biloba 60 MG CAPS Take 60 mg by mouth in the morning and at bedtime.   [name] glimepiride (AMARYL) 4 MG tablet Take 4 mg by mouth daily with breakfast.  . metFORMIN (GLUCOPHAGE) 1000 MG tablet Take 1,000 mg by mouth 2 (two) times daily with a meal.   . rosuvastatin (CRESTOR) 20 MG tablet Take 1 tablet (20 mg total) by mouth daily.  [name] triamcinolone (NASACORT) 55 MCG/ACT AERO nasal inhaler Place 1 spray into the nose daily as needed (Congestion).   . vitamin E 180 MG (400 UNITS) capsule Take 400 Units by mouth daily.   . Zinc 30 MG TABS Take 30 mg by mouth daily.   . [DISCONTINUED] amLODipine (NORVASC) 10 MG tablet Take by mouth.  . [DISCONTINUED] rosuvastatin (CRESTOR) 20 MG tablet Take by mouth.  . [DISCONTINUED] valsartan (DIOVAN) 320 MG tablet Take by mouth.     Allergies:   Atorvastatin   Social History   Socioeconomic History  . Marital status: Married    Spouse name: Not on file  .  Number of children: Not on file  . Years of education: Not on file  . Highest education level: Not on file  Occupational History  . Not on file  Tobacco Use  . Smoking status: Former Research scientist (life sciences)  . Smokeless tobacco: Never Used  Substance and Sexual Activity  . Alcohol use: Yes  . Drug use: Not on file  . Sexual activity: Not on file  Other Topics Concern  . Not on file  Social History Narrative  . Not on file   Social Determinants of Health   Financial Resource Strain:   . Difficulty of Paying Living Expenses: Not on file  Food Insecurity:   . Worried About Charity fundraiser in the Last Year: Not on  file  . Ran Out of Food in the Last Year: Not on file  Transportation Needs:   . Lack of Transportation (Medical): Not on file  . Lack of Transportation (Non-Medical): Not on file  Physical Activity:   . Days of Exercise per Week: Not on file  . Minutes of Exercise per Session: Not on file  Stress:   . Feeling of Stress : Not on file  Social Connections:   . Frequency of Communication with Friends and Family: Not on file  . Frequency of Social Gatherings with Friends and Family: Not on file  . Attends Religious Services: Not on file  . Active Member of Clubs or Organizations: Not on file  . Attends Archivist Meetings: Not on file  . Marital Status: Not on file     Family History: Father heart and MI at age 86.  ROS:   Please see the history of present illness.     All other systems reviewed and are negative.  EKGs/Labs/Other Studies Reviewed:    The following studies were reviewed today:   EKG:  EKG is  ordered today.  The ekg ordered today demonstrates normal sinus rhythm, normal ECG.  Recent Labs: No results found for requested labs within last 8760 hours.  Recent Lipid Panel No results found for: CHOL, TRIG, HDL, CHOLHDL, VLDL, LDLCALC, LDLDIRECT  Physical Exam:    VS:  BP 122/74 (BP Location: Right Arm, Patient Position: Sitting, Cuff Size: Normal)   Pulse 82   Ht 5\' 7"  (1.702 m)   Wt 170 lb 3.2 oz (77.2 kg)   SpO2 98%   BMI 26.66 kg/m     Wt Readings from Last 3 Encounters:  03/28/20 170 lb 3.2 oz (77.2 kg)  10/24/15 175 lb (79.4 kg)  08/11/15 184 lb (83.5 kg)     GEN:  Well nourished, well developed in no acute distress HEENT: Normal NECK: No JVD; No carotid bruits LYMPHATICS: No lymphadenopathy CARDIAC: RRR, no murmurs, rubs, gallops RESPIRATORY:  Clear to auscultation without rales, wheezing or rhonchi  ABDOMEN: Soft, non-tender, non-distended MUSCULOSKELETAL:  No edema; No deformity  SKIN: Warm and dry NEUROLOGIC:  Alert and oriented  x 3 PSYCHIATRIC:  Normal affect   ASSESSMENT:    1. Coronary artery disease of native artery of native heart with stable angina pectoris ([place])   2. Essential hypertension   3. Pure hypercholesterolemia    PLAN:    In order of problems listed above:  1. Patient with symptoms consistent with angina.  Has a recent coronary calcium scan with a score of 1424, calcifications involving all 3 coronary arteries.  Has risk factors of hypertension, diabetes, family history of early CAD.  Patient is high cardiac risk, and a stress  test/noninvasive test if negative will not change management plan and if positive only confirms pretest probability, which is high.  We will schedule patient for left heart cath.  Get echocardiogram.  Continue aspirin 81 mg, start Crestor 20 mg daily. 2. History of hypertension, BP controlled, continue amlodipine, valsartan. 3. History of hyperlipidemia, get fasting lipid profile.  Start Crestor 20 mg as above.  Follow-up after echo and left heart cath.  Total encounter time 65 minutes  Greater than 50% was spent in counseling and coordination of care with the patient    Medication Adjustments/Labs and Tests Ordered: Current medicines are reviewed at length with the patient today.  Concerns regarding medicines are outlined above.  Orders Placed This Encounter  Procedures  . Lipid panel  . CBC  . Basic metabolic panel  . ECHOCARDIOGRAM COMPLETE   Meds ordered this encounter  Medications  . rosuvastatin (CRESTOR) 20 MG tablet    Sig: Take 1 tablet (20 mg total) by mouth daily.    Dispense:  30 tablet    Refill:  5    Patient Instructions  Medication Instructions:  Your physician has recommended you make the following change in your medication:  START taking rosuvastatin (CRESTOR) 20 MG tablet: Take 1 tablet (20 mg total) by mouth daily.  *If you need a refill on your cardiac medications before your next appointment, please call your pharmacy*   Lab  Work: Today you had a fasting lipid, CBC, and a BMP drawn. You will also need to get a COVID test done at the drive through in front of the [facility]. Hours are 8am-1pm.  If you have labs (blood work) drawn today and your tests are completely normal, you will receive your results only by: [name] MyChart Message (if you have MyChart) OR . A paper copy in the mail If you have any lab test that is abnormal or we need to change your treatment, we will call you to review the results.   Testing/Procedures:  1.  Your physician has requested that you have an echocardiogram. Echocardiography is a painless test that uses sound waves to create images of your heart. It provides your doctor with information about the size and shape of your heart and how well your heart's chambers and valves are working. This procedure takes approximately one hour. There are no restrictions for this procedure.  2. [facility] Cardiac Cath Instructions     You are scheduled for a Cardiac Cath on:__________Mon. 9/20_______________  Please arrive at __0930___am on the day of your procedure  Please expect a call from our [place] to pre-register you  Do not eat/drink anything after midnight  Someone will need to drive you home  It is recommended someone be with you for the first 24 hours after your procedure  Wear clothes that are easy to get on/off and wear slip on shoes if possible   Medications bring a current list of all medications with you.   _XX__ Do not take these medications before your procedure: valsartan (DIOVAN), glimepiride (AMARYL) 4 MG tablet  _XX__ Do not take your metFORMIN (GLUCOPHAGE) 24 hours prior, and 48 hours after your procedure.  Day of your procedure:  Arrive at the [facility] entrance.  Free valet service is available.  After entering the [place] please check-in at the registration desk (1st desk on your right) to receive your armband. After receiving  your armband someone will escort you to the cardiac cath/special procedures waiting area.  The usual length of stay after your procedure is about 2 to 3 hours.  This can vary.  If you have any questions, please call our office at [phone], or you may call the cardiac cath lab at [facility] directly at [phone]    Follow-Up: At [facility], you and your health needs are our priority.  As part of our continuing mission to provide you with exceptional heart care, we have created designated Provider Care Teams.  These Care Teams include your primary Cardiologist (physician) and Advanced Practice Providers (APPs -  Physician Assistants and Nurse Practitioners) who all work together to provide you with the care you need, when you need it.  We recommend signing up for the patient portal called "MyChart".  Sign up information is provided on this After Visit Summary.  MyChart is used to connect with patients for Virtual Visits (Telemedicine).  Patients are able to view lab/test results, encounter notes, upcoming appointments, etc.  Non-urgent messages can be sent to your provider as well.   To learn more about what you can do with MyChart, go to [email or web address].    Your next appointment:   Follow up after Echo and Cath lab procedure   The format for your next appointment:   In Person  Provider:   Kate Sable, MD   Other Instructions      Signed, Kate Sable, MD  03/28/2020 12:57 PM    [place]

## 2020-03-29 LAB — BASIC METABOLIC PANEL
BUN/Creatinine Ratio: 19 (ref 9–20)
BUN: 24 mg/dL (ref 6–24)
CO2: 22 mmol/L (ref 20–29)
Calcium: 9.8 mg/dL (ref 8.7–10.2)
Chloride: 104 mmol/L (ref 96–106)
Creatinine, Ser: 1.25 mg/dL (ref 0.76–1.27)
GFR calc Af Amer: 73 mL/min/{1.73_m2} (ref >59)
GFR calc non Af Amer: 64 mL/min/{1.73_m2} (ref >59)
Glucose: 260 mg/dL — ABNORMAL HIGH (ref 65–99)
Potassium: 5.1 mmol/L (ref 3.5–5.2)
Sodium: 141 mmol/L (ref 134–144)

## 2020-03-29 LAB — LIPID PANEL
Chol/HDL Ratio: 4.3 ratio (ref 0.0–5.0)
Cholesterol, Total: 169 mg/dL (ref 100–199)
HDL: 39 mg/dL — ABNORMAL LOW (ref >39)
LDL Chol Calc (NIH): 96 mg/dL (ref 0–99)
Triglycerides: 197 mg/dL — ABNORMAL HIGH (ref 0–149)
VLDL Cholesterol Cal: 34 mg/dL (ref 5–40)

## 2020-03-29 LAB — CBC
Hematocrit: 36.5 % — ABNORMAL LOW (ref 37.5–51.0)
Hemoglobin: 12.3 g/dL — ABNORMAL LOW (ref 13.0–17.7)
MCH: 27.8 pg (ref 26.6–33.0)
MCHC: 33.7 g/dL (ref 31.5–35.7)
MCV: 82 fL (ref 79–97)
Platelets: 337 10*3/uL (ref 150–450)
RBC: 4.43 x10E6/uL (ref 4.14–5.80)
RDW: 12.9 % (ref 11.6–15.4)
WBC: 6.5 10*3/uL (ref 3.4–10.8)

## 2020-03-29 LAB — SARS CORONAVIRUS 2 (TAT 6-24 HRS): SARS Coronavirus 2: NEGATIVE

## 2020-03-29 NOTE — Addendum Note (Signed)
Addended by: Ronaldo Miyamoto on: 03/29/2020 12:55 PM   Modules accepted: Orders

## 2020-04-01 ENCOUNTER — Encounter
Admission: RE | Disposition: A | Discharge status: Home / Self Care | Payer: Self-pay | Source: Home / Self Care | Attending: Cardiovascular Disease

## 2020-04-01 ENCOUNTER — Telehealth: Payer: Self-pay

## 2020-04-01 ENCOUNTER — Ambulatory Visit
Admission: RE | Admit: 2020-04-01 | Discharge: 2020-04-01 | Disposition: A | Discharge status: Home / Self Care | Payer: BC Managed Care – PPO | Attending: Cardiovascular Disease | Admitting: Cardiovascular Disease

## 2020-04-01 ENCOUNTER — Telehealth: Payer: Self-pay | Admitting: Cardiovascular Disease

## 2020-04-01 ENCOUNTER — Other Ambulatory Visit: Payer: Self-pay

## 2020-04-01 ENCOUNTER — Encounter: Payer: Self-pay | Admitting: Cardiovascular Disease

## 2020-04-01 DIAGNOSIS — E119 Type 2 diabetes mellitus without complications: Secondary | ICD-10-CM | POA: Insufficient documentation

## 2020-04-01 DIAGNOSIS — Z87891 Personal history of nicotine dependence: Secondary | ICD-10-CM | POA: Insufficient documentation

## 2020-04-01 DIAGNOSIS — I2510 Atherosclerotic heart disease of native coronary artery without angina pectoris: Secondary | ICD-10-CM | POA: Insufficient documentation

## 2020-04-01 DIAGNOSIS — Z79899 Other long term (current) drug therapy: Secondary | ICD-10-CM | POA: Diagnosis not present

## 2020-04-01 DIAGNOSIS — I10 Essential (primary) hypertension: Secondary | ICD-10-CM | POA: Diagnosis not present

## 2020-04-01 DIAGNOSIS — I25118 Atherosclerotic heart disease of native coronary artery with other forms of angina pectoris: Secondary | ICD-10-CM

## 2020-04-01 DIAGNOSIS — E785 Hyperlipidemia, unspecified: Secondary | ICD-10-CM | POA: Diagnosis not present

## 2020-04-01 DIAGNOSIS — Z8249 Family history of ischemic heart disease and other diseases of the circulatory system: Secondary | ICD-10-CM | POA: Insufficient documentation

## 2020-04-01 DIAGNOSIS — Z7951 Long term (current) use of inhaled steroids: Secondary | ICD-10-CM | POA: Diagnosis not present

## 2020-04-01 DIAGNOSIS — Z791 Long term (current) use of non-steroidal anti-inflammatories (NSAID): Secondary | ICD-10-CM | POA: Diagnosis not present

## 2020-04-01 DIAGNOSIS — Z01812 Encounter for preprocedural laboratory examination: Secondary | ICD-10-CM

## 2020-04-01 DIAGNOSIS — Z7984 Long term (current) use of oral hypoglycemic drugs: Secondary | ICD-10-CM | POA: Insufficient documentation

## 2020-04-01 DIAGNOSIS — E7800 Pure hypercholesterolemia, unspecified: Secondary | ICD-10-CM | POA: Insufficient documentation

## 2020-04-01 HISTORY — PX: LEFT HEART CATH AND CORONARY ANGIOGRAPHY: CATH118249

## 2020-04-01 HISTORY — DX: Hyperlipidemia, unspecified: E78.5

## 2020-04-01 LAB — GLUCOSE, CAPILLARY
Glucose-Capillary: 266 mg/dL — ABNORMAL HIGH (ref 70–99)
Glucose-Capillary: 243 mg/dL — ABNORMAL HIGH (ref 70–99)

## 2020-04-01 SURGERY — LEFT HEART CATH AND CORONARY ANGIOGRAPHY
Anesthesia: Moderate Sedation

## 2020-04-01 MED ORDER — ACETAMINOPHEN 325 MG PO TABS: 650.0000 mg | ORAL_TABLET | ORAL | Status: DC | PRN

## 2020-04-01 MED ORDER — VERAPAMIL HCL 2.5 MG/ML IV SOLN
INTRAVENOUS | Status: DC | PRN
  Administered 2020-04-01 (×2): 2.5 mg via INTRA_ARTERIAL

## 2020-04-01 MED ORDER — CLOPIDOGREL BISULFATE 75 MG PO TABS: 75.0000 mg | ORAL_TABLET | Freq: Every day | ORAL | 1 refills | Status: DC

## 2020-04-01 MED ORDER — SODIUM CHLORIDE 0.9 % WEIGHT BASED INFUSION
3.0000 mL/kg/h | INTRAVENOUS | Status: AC
  Administered 2020-04-01: 3 mL/kg/h via INTRAVENOUS

## 2020-04-01 MED ORDER — HEPARIN SODIUM (PORCINE) 1000 UNIT/ML IJ SOLN
INTRAMUSCULAR | Status: DC | PRN
  Administered 2020-04-01: 4000 [IU] via INTRAVENOUS

## 2020-04-01 MED ORDER — FENTANYL CITRATE (PF) 100 MCG/2ML IJ SOLN
INTRAMUSCULAR | Status: DC | PRN
Start: 2020-04-01 — End: 2020-04-01
  Administered 2020-04-01 (×2): 50 ug via INTRAVENOUS

## 2020-04-01 MED ORDER — FENTANYL CITRATE (PF) 100 MCG/2ML IJ SOLN
INTRAMUSCULAR | Status: AC
  Filled 2020-04-01: qty 2

## 2020-04-01 MED ORDER — SODIUM CHLORIDE 0.9% FLUSH: 3.0000 mL | INTRAVENOUS | Status: DC | PRN

## 2020-04-01 MED ORDER — HEPARIN (PORCINE) IN NACL 1000-0.9 UT/500ML-% IV SOLN
INTRAVENOUS | Status: AC
  Filled 2020-04-01: qty 1000

## 2020-04-01 MED ORDER — SODIUM CHLORIDE 0.9 % WEIGHT BASED INFUSION: 1.0000 mL/kg/h | INTRAVENOUS | Status: DC

## 2020-04-01 MED ORDER — HEPARIN (PORCINE) IN NACL 2000-0.9 UNIT/L-% IV SOLN: INTRAVENOUS | Status: DC | PRN

## 2020-04-01 MED ORDER — SODIUM CHLORIDE 0.9 % IV SOLN: INTRAVENOUS | Status: DC

## 2020-04-01 MED ORDER — HEPARIN (PORCINE) IN NACL 1000-0.9 UT/500ML-% IV SOLN
INTRAVENOUS | Status: DC | PRN
  Administered 2020-04-01: 1000 mL

## 2020-04-01 MED ORDER — SODIUM CHLORIDE 0.9% FLUSH: 3.0000 mL | Freq: Two times a day (BID) | INTRAVENOUS | Status: DC

## 2020-04-01 MED ORDER — LIDOCAINE HCL (PF) 1 % IJ SOLN
INTRAMUSCULAR | Status: AC
  Filled 2020-04-01: qty 30

## 2020-04-01 MED ORDER — MIDAZOLAM HCL 2 MG/2ML IJ SOLN
INTRAMUSCULAR | Status: AC
  Filled 2020-04-01: qty 2

## 2020-04-01 MED ORDER — ONDANSETRON HCL 4 MG/2ML IJ SOLN: 4.0000 mg | Freq: Four times a day (QID) | INTRAMUSCULAR | Status: DC | PRN

## 2020-04-01 MED ORDER — VERAPAMIL HCL 2.5 MG/ML IV SOLN
INTRAVENOUS | Status: AC
  Filled 2020-04-01: qty 2

## 2020-04-01 MED ORDER — MIDAZOLAM HCL 2 MG/2ML IJ SOLN
INTRAMUSCULAR | Status: DC | PRN
  Administered 2020-04-01: 1 mg via INTRAVENOUS

## 2020-04-01 MED ORDER — HEPARIN SODIUM (PORCINE) 1000 UNIT/ML IJ SOLN
INTRAMUSCULAR | Status: AC
  Filled 2020-04-01: qty 1

## 2020-04-01 MED ORDER — SODIUM CHLORIDE 0.9 % IV SOLN: 250.0000 mL | INTRAVENOUS | Status: DC | PRN

## 2020-04-01 MED ORDER — IOHEXOL 300 MG/ML  SOLN
INTRAMUSCULAR | Status: DC | PRN
  Administered 2020-04-01: 60 mL

## 2020-04-01 MED ORDER — LIDOCAINE HCL (PF) 1 % IJ SOLN
INTRAMUSCULAR | Status: DC | PRN
  Administered 2020-04-01: 2 mL

## 2020-04-01 SURGICAL SUPPLY — 8 items
CATH INFINITI 5FR JK (CATHETERS) ×2 IMPLANT
DEVICE RAD TR BAND REGULAR (VASCULAR PRODUCTS) ×2 IMPLANT
GLIDESHEATH SLEND SS 6F .021 (SHEATH) ×2 IMPLANT
GUIDEWIRE INQWIRE 1.5J.035X260 (WIRE) ×1 IMPLANT
INQWIRE 1.5J .035X260CM (WIRE) ×2
KIT MANI 3VAL PERCEP (MISCELLANEOUS) ×2 IMPLANT
PACK CARDIAC CATH (CUSTOM PROCEDURE TRAY) ×2 IMPLANT
WIRE HITORQ VERSACORE ST 145CM (WIRE) ×2 IMPLANT

## 2020-04-01 NOTE — Telephone Encounter (Addendum)
Secure chat received from Dr. Fletcher Anon on 04/01/20    His cardiac cath today showed significant LAD disease in the proximal and mid segment with significant calcifications. Recommend scheduling LAD PCI with IVUS and intravascular lithotripsy at [facility] with me on Wednesday the 29th. He is scheduled for an echocardiogram on the 28th and he should keep that. Send a prescription for Plavix 75 mg once daily. I did discuss this with the patient after the catheterization today and he is aware that we will be getting in touch with him. He should stay off work from now until at least 1 week after PCI.   Patients PCI w/IVUS and intravascular lithotripsy scheduled at [facility] hospital on 04/10/20 @ 11:30am with Dr. Fletcher Anon. 9:30am arrival. Rx for Plaxix 75 mg daily has been sent to the patients pharmacy.  Message fwd to Dr. Thereasa Solo nurse Ralene Muskrat, RN to order labs, COVID test, and provide the patient with pre-procedure instructions.

## 2020-04-01 NOTE — Interval H&P Note (Signed)
Cath Lab Visit (complete for each Cath Lab visit)  Clinical Evaluation Leading to the Procedure:   ACS: No.  Non-ACS:    Anginal Classification: CCS III  Anti-ischemic medical therapy: Minimal Therapy (1 class of medications)  Non-Invasive Test Results: No non-invasive testing performed  Prior CABG: No previous CABG      History and Physical Interval Note:  04/01/2020 10:34 AM  Jesse Ray  has presented today for surgery, with the diagnosis of LT Heart Cath   CAD.  The various methods of treatment have been discussed with the patient and family. After consideration of risks, benefits and other options for treatment, the patient has consented to  Procedure(s): LEFT HEART CATH AND CORONARY ANGIOGRAPHY (N/A) as a surgical intervention.  The patient's history has been reviewed, patient examined, no change in status, stable for surgery.  I have reviewed the patient's chart and labs.  Questions were answered to the patient's satisfaction.     Kathlyn Sacramento

## 2020-04-01 NOTE — Progress Notes (Signed)
Dr. Fletcher Anon at bedside, speaking with pt. Re: cath results. Pt. Verbalized understanding of conversation.

## 2020-04-01 NOTE — Telephone Encounter (Signed)
Called and spoke with patients wife to relay the below information. Also informed them to get a Covid test, bmp and a cbc on Monday 9/27 at [facility]. Below information was also sent through [place] [facility]. Patients wife was very grateful for the phone call.    You are scheduled for a PCI with IVUS and intravascular lithotripsy on Wednesday, September 29 with Dr. Kathlyn Sacramento.  1. Please arrive at the [facility] (Main Entrance A) at [facility]: [address] [place], [place] [address] at 9:30 AM (This time is two hours before your procedure to ensure your preparation). Free valet parking service is available.   Special note: Every effort is made to have your procedure done on time. Please understand that emergencies sometimes delay scheduled procedures.  2. Diet: Do not eat solid foods after midnight.  The patient may have clear liquids until 5am upon the day of the procedure.  3. Labs: You will need to have blood drawn at the medical mall.   4. Medication instructions in preparation for your procedure:  _XX__ Do not take these medications before your procedure: valsartan (DIOVAN), glimepiride (AMARYL) 4 MG tablet  _XX__ Do not take your metFORMIN (GLUCOPHAGE) 24 hours prior, and 48 hours after your procedure.   5. Plan for one night stay--bring personal belongings. 6. Bring a current list of your medications and current insurance cards. 7. You MUST have a responsible person to drive you home. 8. Someone MUST be with you the first 24 hours after you arrive home or your discharge will be delayed. 9. Please wear clothes that are easy to get on and off and wear slip-on shoes.  Thank you for allowing [place] to care for you!   -- [facility]

## 2020-04-01 NOTE — Telephone Encounter (Signed)
Called and spoke with wife. See previous telephone encounter.

## 2020-04-01 NOTE — Telephone Encounter (Signed)
Patient wife calling to schedule procedure at [facility].

## 2020-04-01 NOTE — Telephone Encounter (Signed)
Error encounter. 

## 2020-04-02 ENCOUNTER — Other Ambulatory Visit: Payer: Self-pay | Admitting: Cardiology

## 2020-04-02 DIAGNOSIS — R079 Chest pain, unspecified: Secondary | ICD-10-CM

## 2020-04-03 ENCOUNTER — Telehealth: Payer: Self-pay | Admitting: Cardiology

## 2020-04-03 NOTE — Telephone Encounter (Signed)
[employer] forms received  Placed in nurse box Unsure if forms are for [place] or Fletcher Anon based off cover page

## 2020-04-05 ENCOUNTER — Other Ambulatory Visit: Payer: BC Managed Care – PPO

## 2020-04-08 ENCOUNTER — Other Ambulatory Visit: Payer: Self-pay

## 2020-04-08 ENCOUNTER — Other Ambulatory Visit
Admission: RE | Admit: 2020-04-08 | Discharge: 2020-04-08 | Disposition: A | Discharge status: Home / Self Care | Payer: BC Managed Care – PPO | Source: Ambulatory Visit | Attending: Cardiovascular Disease | Admitting: Cardiovascular Disease

## 2020-04-08 ENCOUNTER — Other Ambulatory Visit
Admission: RE | Admit: 2020-04-08 | Discharge: 2020-04-08 | Disposition: A | Discharge status: Home / Self Care | Payer: BC Managed Care – PPO | Source: Home / Self Care | Attending: Cardiology | Admitting: Cardiology

## 2020-04-08 DIAGNOSIS — Z01812 Encounter for preprocedural laboratory examination: Secondary | ICD-10-CM

## 2020-04-08 DIAGNOSIS — Z20822 Contact with and (suspected) exposure to covid-19: Secondary | ICD-10-CM | POA: Diagnosis not present

## 2020-04-08 LAB — CBC
HCT: 34.8 % — ABNORMAL LOW (ref 39.0–52.0)
Hemoglobin: 11.6 g/dL — ABNORMAL LOW (ref 13.0–17.0)
MCH: 28.2 pg (ref 26.0–34.0)
MCHC: 33.3 g/dL (ref 30.0–36.0)
MCV: 84.5 fL (ref 80.0–100.0)
Platelets: 265 10*3/uL (ref 150–400)
RBC: 4.12 MIL/uL — ABNORMAL LOW (ref 4.22–5.81)
RDW: 12.7 % (ref 11.5–15.5)
WBC: 7.2 10*3/uL (ref 4.0–10.5)
nRBC: 0 % (ref 0.0–0.2)

## 2020-04-08 LAB — BASIC METABOLIC PANEL
Anion gap: 9 (ref 5–15)
BUN: 29 mg/dL — ABNORMAL HIGH (ref 6–20)
CO2: 26 mmol/L (ref 22–32)
Calcium: 9.2 mg/dL (ref 8.9–10.3)
Chloride: 103 mmol/L (ref 98–111)
Creatinine, Ser: 1.07 mg/dL (ref 0.61–1.24)
GFR calc Af Amer: >60 mL/min (ref >60)
GFR calc non Af Amer: >60 mL/min (ref >60)
Glucose, Bld: 255 mg/dL — ABNORMAL HIGH (ref 70–99)
Potassium: 4.5 mmol/L (ref 3.5–5.1)
Sodium: 138 mmol/L (ref 135–145)

## 2020-04-08 LAB — SARS CORONAVIRUS 2 (TAT 6-24 HRS): SARS Coronavirus 2: NEGATIVE

## 2020-04-09 ENCOUNTER — Ambulatory Visit (INDEPENDENT_AMBULATORY_CARE_PROVIDER_SITE_OTHER): Payer: BC Managed Care – PPO

## 2020-04-09 ENCOUNTER — Telehealth: Payer: Self-pay | Admitting: *Deleted

## 2020-04-09 DIAGNOSIS — R079 Chest pain, unspecified: Secondary | ICD-10-CM

## 2020-04-09 LAB — ECHOCARDIOGRAM COMPLETE
AR max vel: 3.62 cm2
AV Area VTI: 4.47 cm2
AV Area mean vel: 4.02 cm2
AV Mean grad: 4 mmHg
AV Peak grad: 12.3 mmHg
Ao pk vel: 1.75 m/s
Area-P 1/2: 3.53 cm2
Calc EF: 56.2 %
S' Lateral: 3.3 cm
Single Plane A2C EF: 57.4 %
Single Plane A4C EF: 53.9 %

## 2020-04-09 NOTE — Telephone Encounter (Signed)
Pt contacted pre-catheterization scheduled at [facility] for: Wednesday April 10, 2020 11:30 AM Verified arrival time and place: [facility]) at:  9:30 AM   No solid food after midnight prior to cath, clear liquids until 5 AM day of procedure.  Hold: Metformin-day of procedure and 48 hours post procedure. Glimepiride-AM of procedure [place]-AM of procedure  Except hold medications AM meds can be  taken pre-cath with sips of water including: ASA 81 mg Plavix 75 mg  Confirmed patient has responsible adult to drive home post procedure and be with patient first 24 hours after arriving home: yes  You are allowed ONE visitor in the waiting room during the time you are at the hospital for your procedure. Both you and your visitor must wear a mask once you enter the hospital.       COVID-19 Pre-Screening Questions:  . In the past 14 days have you had a new cough, new headache, new nasal congestion, fever (100.4 or greater) unexplained body aches, new sore throat, or sudden loss of taste or sense of smell? no . In the past 14 days have you been around anyone with known Covid 19? no . Have you been vaccinated for COVID-19? Yes ,see immunization history  Reviewed procedure/mask/visitor instructions, COVID-19 questions reviewed with patient's wife (DPR) and patient.

## 2020-04-10 ENCOUNTER — Ambulatory Visit (HOSPITAL_COMMUNITY)
Admission: RE | Admit: 2020-04-10 | Discharge: 2020-04-11 | Disposition: A | Discharge status: Home / Self Care | Payer: BC Managed Care – PPO | Attending: Cardiovascular Disease | Admitting: Cardiovascular Disease

## 2020-04-10 ENCOUNTER — Ambulatory Visit (HOSPITAL_COMMUNITY)
Admission: RE | Disposition: A | Discharge status: Home / Self Care | Payer: Self-pay | Source: Home / Self Care | Attending: Cardiovascular Disease

## 2020-04-10 ENCOUNTER — Other Ambulatory Visit: Payer: Self-pay

## 2020-04-10 DIAGNOSIS — I25118 Atherosclerotic heart disease of native coronary artery with other forms of angina pectoris: Secondary | ICD-10-CM | POA: Diagnosis not present

## 2020-04-10 DIAGNOSIS — Z95820 Peripheral vascular angioplasty status with implants and grafts: Secondary | ICD-10-CM

## 2020-04-10 DIAGNOSIS — Z791 Long term (current) use of non-steroidal anti-inflammatories (NSAID): Secondary | ICD-10-CM | POA: Diagnosis not present

## 2020-04-10 DIAGNOSIS — Z87891 Personal history of nicotine dependence: Secondary | ICD-10-CM | POA: Insufficient documentation

## 2020-04-10 DIAGNOSIS — E785 Hyperlipidemia, unspecified: Secondary | ICD-10-CM | POA: Diagnosis present

## 2020-04-10 DIAGNOSIS — E1121 Type 2 diabetes mellitus with diabetic nephropathy: Secondary | ICD-10-CM | POA: Insufficient documentation

## 2020-04-10 DIAGNOSIS — I209 Angina pectoris, unspecified: Secondary | ICD-10-CM | POA: Diagnosis present

## 2020-04-10 DIAGNOSIS — Z7982 Long term (current) use of aspirin: Secondary | ICD-10-CM | POA: Insufficient documentation

## 2020-04-10 DIAGNOSIS — Z7984 Long term (current) use of oral hypoglycemic drugs: Secondary | ICD-10-CM | POA: Diagnosis not present

## 2020-04-10 DIAGNOSIS — I10 Essential (primary) hypertension: Secondary | ICD-10-CM | POA: Diagnosis not present

## 2020-04-10 DIAGNOSIS — Z955 Presence of coronary angioplasty implant and graft: Secondary | ICD-10-CM | POA: Insufficient documentation

## 2020-04-10 DIAGNOSIS — L4050 Arthropathic psoriasis, unspecified: Secondary | ICD-10-CM | POA: Insufficient documentation

## 2020-04-10 DIAGNOSIS — E7800 Pure hypercholesterolemia, unspecified: Secondary | ICD-10-CM | POA: Insufficient documentation

## 2020-04-10 DIAGNOSIS — Z8249 Family history of ischemic heart disease and other diseases of the circulatory system: Secondary | ICD-10-CM | POA: Insufficient documentation

## 2020-04-10 DIAGNOSIS — I25119 Atherosclerotic heart disease of native coronary artery with unspecified angina pectoris: Secondary | ICD-10-CM

## 2020-04-10 DIAGNOSIS — IMO0002 Reserved for concepts with insufficient information to code with codable children: Secondary | ICD-10-CM | POA: Diagnosis present

## 2020-04-10 DIAGNOSIS — Z79899 Other long term (current) drug therapy: Secondary | ICD-10-CM | POA: Insufficient documentation

## 2020-04-10 HISTORY — PX: CORONARY STENT INTERVENTION: CATH118234

## 2020-04-10 HISTORY — DX: Atherosclerotic heart disease of native coronary artery without angina pectoris: I25.10

## 2020-04-10 HISTORY — PX: INTRAVASCULAR ULTRASOUND/IVUS: CATH118244

## 2020-04-10 LAB — GLUCOSE, CAPILLARY
Glucose-Capillary: 204 mg/dL — ABNORMAL HIGH (ref 70–99)
Glucose-Capillary: 158 mg/dL — ABNORMAL HIGH (ref 70–99)
Glucose-Capillary: 178 mg/dL — ABNORMAL HIGH (ref 70–99)
Glucose-Capillary: 316 mg/dL — ABNORMAL HIGH (ref 70–99)

## 2020-04-10 LAB — POCT ACTIVATED CLOTTING TIME
Activated Clotting Time: 329 seconds
Activated Clotting Time: 334 seconds

## 2020-04-10 SURGERY — CORONARY STENT INTERVENTION
Anesthesia: LOCAL

## 2020-04-10 MED ORDER — ONDANSETRON HCL 4 MG/2ML IJ SOLN: 4.0000 mg | Freq: Four times a day (QID) | INTRAMUSCULAR | Status: DC | PRN

## 2020-04-10 MED ORDER — EXENATIDE 10 MCG/0.04ML ~~LOC~~ SOPN: 10.0000 ug | PEN_INJECTOR | Freq: Two times a day (BID) | SUBCUTANEOUS | Status: DC

## 2020-04-10 MED ORDER — SODIUM CHLORIDE 0.9% FLUSH: 3.0000 mL | INTRAVENOUS | Status: DC | PRN

## 2020-04-10 MED ORDER — SODIUM CHLORIDE 0.9 % IV SOLN: 250.0000 mL | INTRAVENOUS | Status: DC | PRN

## 2020-04-10 MED ORDER — CLOPIDOGREL BISULFATE 300 MG PO TABS
ORAL_TABLET | ORAL | Status: AC
  Filled 2020-04-10: qty 1

## 2020-04-10 MED ORDER — ASPIRIN 81 MG PO CHEW: 81.0000 mg | CHEWABLE_TABLET | ORAL | Status: DC

## 2020-04-10 MED ORDER — NITROGLYCERIN 1 MG/10 ML FOR IR/CATH LAB
INTRA_ARTERIAL | Status: DC | PRN
  Administered 2020-04-10: 100 ug via INTRACORONARY

## 2020-04-10 MED ORDER — HEPARIN (PORCINE) IN NACL 1000-0.9 UT/500ML-% IV SOLN
INTRAVENOUS | Status: DC | PRN
  Administered 2020-04-10: 500 mL

## 2020-04-10 MED ORDER — CLOPIDOGREL BISULFATE 300 MG PO TABS
ORAL_TABLET | ORAL | Status: DC | PRN
  Administered 2020-04-10: 300 mg via ORAL

## 2020-04-10 MED ORDER — ADALIMUMAB 40 MG/0.8ML ~~LOC~~ AJKT: 0.4000 mL | AUTO-INJECTOR | SUBCUTANEOUS | Status: DC

## 2020-04-10 MED ORDER — SODIUM CHLORIDE 0.9 % WEIGHT BASED INFUSION
3.0000 mL/kg/h | INTRAVENOUS | Status: DC
  Administered 2020-04-10: 3 mL/kg/h via INTRAVENOUS

## 2020-04-10 MED ORDER — HEPARIN SODIUM (PORCINE) 1000 UNIT/ML IJ SOLN
INTRAMUSCULAR | Status: AC
  Filled 2020-04-10: qty 1

## 2020-04-10 MED ORDER — ACETAMINOPHEN 325 MG PO TABS: 650.0000 mg | ORAL_TABLET | ORAL | Status: DC | PRN

## 2020-04-10 MED ORDER — GLIMEPIRIDE 4 MG PO TABS
4.0000 mg | ORAL_TABLET | Freq: Every day | ORAL | Status: DC
  Administered 2020-04-11: 4 mg via ORAL
  Filled 2020-04-10 (×2): qty 1

## 2020-04-10 MED ORDER — ROSUVASTATIN CALCIUM 20 MG PO TABS
20.0000 mg | ORAL_TABLET | Freq: Every day | ORAL | Status: DC
  Administered 2020-04-10: 20 mg via ORAL
  Filled 2020-04-10: qty 1

## 2020-04-10 MED ORDER — MIDAZOLAM HCL 2 MG/2ML IJ SOLN
INTRAMUSCULAR | Status: DC | PRN
  Administered 2020-04-10 (×2): 1 mg via INTRAVENOUS

## 2020-04-10 MED ORDER — HEPARIN (PORCINE) IN NACL 1000-0.9 UT/500ML-% IV SOLN
INTRAVENOUS | Status: AC
  Filled 2020-04-10: qty 1000

## 2020-04-10 MED ORDER — FENTANYL CITRATE (PF) 100 MCG/2ML IJ SOLN
INTRAMUSCULAR | Status: AC
  Filled 2020-04-10: qty 2

## 2020-04-10 MED ORDER — AMLODIPINE BESYLATE 10 MG PO TABS
10.0000 mg | ORAL_TABLET | Freq: Every day | ORAL | Status: DC
  Administered 2020-04-11: 10 mg via ORAL
  Filled 2020-04-10: qty 1

## 2020-04-10 MED ORDER — AMLODIPINE BESYLATE-VALSARTAN 10-320 MG PO TABS: 1.0000 | ORAL_TABLET | Freq: Every day | ORAL | Status: DC

## 2020-04-10 MED ORDER — ASPIRIN EC 81 MG PO TBEC
81.0000 mg | DELAYED_RELEASE_TABLET | Freq: Every day | ORAL | Status: DC
  Administered 2020-04-11: 81 mg via ORAL
  Filled 2020-04-10: qty 1

## 2020-04-10 MED ORDER — FENTANYL CITRATE (PF) 100 MCG/2ML IJ SOLN
INTRAMUSCULAR | Status: DC | PRN
Start: 2020-04-10 — End: 2020-04-10
  Administered 2020-04-10 (×3): 50 ug via INTRAVENOUS

## 2020-04-10 MED ORDER — VITAMIN D 25 MCG (1000 UNIT) PO TABS
1000.0000 [IU] | ORAL_TABLET | Freq: Every day | ORAL | Status: DC
  Administered 2020-04-11: 1000 [IU] via ORAL
  Filled 2020-04-10: qty 1

## 2020-04-10 MED ORDER — IOHEXOL 350 MG/ML SOLN
INTRAVENOUS | Status: DC | PRN
  Administered 2020-04-10: 155 mL

## 2020-04-10 MED ORDER — NITROGLYCERIN 1 MG/10 ML FOR IR/CATH LAB
INTRA_ARTERIAL | Status: AC
  Filled 2020-04-10: qty 10

## 2020-04-10 MED ORDER — FAMOTIDINE IN NACL 20-0.9 MG/50ML-% IV SOLN
INTRAVENOUS | Status: AC
  Filled 2020-04-10: qty 50

## 2020-04-10 MED ORDER — HEPARIN SODIUM (PORCINE) 1000 UNIT/ML IJ SOLN
INTRAMUSCULAR | Status: DC | PRN
  Administered 2020-04-10: 8000 [IU] via INTRAVENOUS

## 2020-04-10 MED ORDER — CLOPIDOGREL BISULFATE 75 MG PO TABS: 75.0000 mg | ORAL_TABLET | ORAL | Status: DC

## 2020-04-10 MED ORDER — FAMOTIDINE IN NACL 20-0.9 MG/50ML-% IV SOLN
INTRAVENOUS | Status: AC | PRN
  Administered 2020-04-10: 20 mg via INTRAVENOUS

## 2020-04-10 MED ORDER — CLOPIDOGREL BISULFATE 75 MG PO TABS
75.0000 mg | ORAL_TABLET | Freq: Every day | ORAL | Status: DC
  Administered 2020-04-11: 75 mg via ORAL
  Filled 2020-04-10: qty 1

## 2020-04-10 MED ORDER — VITAMIN E 180 MG (400 UNIT) PO CAPS
400.0000 [IU] | ORAL_CAPSULE | Freq: Every day | ORAL | Status: DC
  Administered 2020-04-11: 400 [IU] via ORAL
  Filled 2020-04-10: qty 1

## 2020-04-10 MED ORDER — SODIUM CHLORIDE 0.9% FLUSH
3.0000 mL | Freq: Two times a day (BID) | INTRAVENOUS | Status: DC
  Administered 2020-04-10 – 2020-04-11 (×2): 3 mL via INTRAVENOUS

## 2020-04-10 MED ORDER — SODIUM CHLORIDE 0.9 % WEIGHT BASED INFUSION: 1.0000 mL/kg/h | INTRAVENOUS | Status: AC

## 2020-04-10 MED ORDER — FENTANYL CITRATE (PF) 100 MCG/2ML IJ SOLN
INTRAMUSCULAR | Status: AC
Start: 2020-04-10 — End: ?
  Filled 2020-04-10: qty 2

## 2020-04-10 MED ORDER — IRBESARTAN 150 MG PO TABS
300.0000 mg | ORAL_TABLET | Freq: Every day | ORAL | Status: DC
  Administered 2020-04-11: 300 mg via ORAL
  Filled 2020-04-10: qty 2

## 2020-04-10 MED ORDER — ALBUTEROL SULFATE HFA 108 (90 BASE) MCG/ACT IN AERS
2.0000 | INHALATION_SPRAY | Freq: Four times a day (QID) | RESPIRATORY_TRACT | Status: DC | PRN
  Filled 2020-04-10: qty 6.7

## 2020-04-10 MED ORDER — LORATADINE 10 MG PO TABS
10.0000 mg | ORAL_TABLET | Freq: Every day | ORAL | Status: DC
  Administered 2020-04-11: 10 mg via ORAL
  Filled 2020-04-10: qty 1

## 2020-04-10 MED ORDER — ASCORBIC ACID 500 MG PO TABS
1000.0000 mg | ORAL_TABLET | Freq: Every day | ORAL | Status: DC
  Administered 2020-04-11: 1000 mg via ORAL
  Filled 2020-04-10: qty 2

## 2020-04-10 MED ORDER — TRIAMCINOLONE ACETONIDE 55 MCG/ACT NA AERO
1.0000 | INHALATION_SPRAY | Freq: Every day | NASAL | Status: DC | PRN
  Filled 2020-04-10: qty 10.8

## 2020-04-10 MED ORDER — SODIUM CHLORIDE 0.9% FLUSH
3.0000 mL | Freq: Two times a day (BID) | INTRAVENOUS | Status: DC
  Administered 2020-04-11: 3 mL via INTRAVENOUS

## 2020-04-10 MED ORDER — LIDOCAINE HCL (PF) 1 % IJ SOLN
INTRAMUSCULAR | Status: AC
  Filled 2020-04-10: qty 30

## 2020-04-10 MED ORDER — MIDAZOLAM HCL 2 MG/2ML IJ SOLN
INTRAMUSCULAR | Status: AC
  Filled 2020-04-10: qty 2

## 2020-04-10 MED ORDER — ZINC 30 MG PO TABS: 30.0000 mg | ORAL_TABLET | Freq: Every day | ORAL | Status: DC

## 2020-04-10 MED ORDER — SODIUM CHLORIDE 0.9 % WEIGHT BASED INFUSION: 1.0000 mL/kg/h | INTRAVENOUS | Status: DC

## 2020-04-10 SURGICAL SUPPLY — 22 items
BALLN SAPPHIRE ~~LOC~~ 3.25X12 (BALLOONS) ×2 IMPLANT
BALLN SAPPHIRE ~~LOC~~ 4.0X12 (BALLOONS) ×2 IMPLANT
BALLN WOLVERINE 2.75X15 (BALLOONS) ×2
BALLOON WOLVERINE 2.75X15 (BALLOONS) ×1 IMPLANT
CATH OPTICROSS HD (CATHETERS) ×2 IMPLANT
CATH SHOCKWAVE 3.0X12 (CATHETERS) IMPLANT
CATH VISTA GUIDE 6FR XBLAD4 (CATHETERS) ×2 IMPLANT
CATHETER SHOCKWAVE 3.0X12 (CATHETERS)
DEVICE CLOSURE MYNXGRIP 6/7F (Vascular Products) ×2 IMPLANT
KIT ENCORE 26 ADVANTAGE (KITS) ×2 IMPLANT
KIT HEART LEFT (KITS) ×2 IMPLANT
KIT MICROPUNCTURE NIT STIFF (SHEATH) ×2 IMPLANT
PACK CARDIAC CATHETERIZATION (CUSTOM PROCEDURE TRAY) ×2 IMPLANT
SHEATH PINNACLE 6F 10CM (SHEATH) ×2 IMPLANT
SHEATH PROBE COVER 6X72 (BAG) ×4 IMPLANT
SLED PULL BACK IVUS (MISCELLANEOUS) ×2 IMPLANT
STENT RESOLUTE ONYX 3.0X18 (Permanent Stent) ×2 IMPLANT
STENT RESOLUTE ONYX 4.0X18 (Permanent Stent) ×2 IMPLANT
TRANSDUCER W/STOPCOCK (MISCELLANEOUS) ×2 IMPLANT
TUBING CIL FLEX 10 FLL-RA (TUBING) ×2 IMPLANT
WIRE EMERALD 3MM-J .035X150CM (WIRE) ×2 IMPLANT
WIRE RUNTHROUGH .014X180CM (WIRE) ×2 IMPLANT

## 2020-04-10 NOTE — Interval H&P Note (Signed)
Cath Lab Visit (complete for each Cath Lab visit)  Clinical Evaluation Leading to the Procedure:   ACS: No.  Non-ACS:    Anginal Classification: CCS III  Anti-ischemic medical therapy: Minimal Therapy (1 class of medications)  Non-Invasive Test Results: No non-invasive testing performed  Prior CABG: No previous CABG      History and Physical Interval Note:  04/10/2020 3:00 PM  Jesse Ray  has presented today for surgery, with the diagnosis of CAD.  The various methods of treatment have been discussed with the patient and family. After consideration of risks, benefits and other options for treatment, the patient has consented to  Procedure(s): CORONARY STENT INTERVENTION (N/A) as a surgical intervention.  The patient's history has been reviewed, patient examined, no change in status, stable for surgery.  I have reviewed the patient's chart and labs.  Questions were answered to the patient's satisfaction.     Kathlyn Sacramento

## 2020-04-11 ENCOUNTER — Encounter (HOSPITAL_COMMUNITY): Payer: Self-pay | Admitting: Cardiovascular Disease

## 2020-04-11 DIAGNOSIS — E785 Hyperlipidemia, unspecified: Secondary | ICD-10-CM

## 2020-04-11 DIAGNOSIS — I10 Essential (primary) hypertension: Secondary | ICD-10-CM

## 2020-04-11 DIAGNOSIS — E7800 Pure hypercholesterolemia, unspecified: Secondary | ICD-10-CM

## 2020-04-11 DIAGNOSIS — Z95820 Peripheral vascular angioplasty status with implants and grafts: Secondary | ICD-10-CM

## 2020-04-11 DIAGNOSIS — E119 Type 2 diabetes mellitus without complications: Secondary | ICD-10-CM

## 2020-04-11 DIAGNOSIS — I209 Angina pectoris, unspecified: Secondary | ICD-10-CM | POA: Diagnosis not present

## 2020-04-11 DIAGNOSIS — I2510 Atherosclerotic heart disease of native coronary artery without angina pectoris: Secondary | ICD-10-CM

## 2020-04-11 DIAGNOSIS — I25118 Atherosclerotic heart disease of native coronary artery with other forms of angina pectoris: Secondary | ICD-10-CM | POA: Diagnosis not present

## 2020-04-11 DIAGNOSIS — I2583 Coronary atherosclerosis due to lipid rich plaque: Secondary | ICD-10-CM

## 2020-04-11 HISTORY — DX: Hyperlipidemia, unspecified: E78.5

## 2020-04-11 HISTORY — DX: Peripheral vascular angioplasty status with implants and grafts: Z95.820

## 2020-04-11 LAB — BASIC METABOLIC PANEL
Anion gap: 9 (ref 5–15)
BUN: 18 mg/dL (ref 6–20)
CO2: 25 mmol/L (ref 22–32)
Calcium: 9.2 mg/dL (ref 8.9–10.3)
Chloride: 105 mmol/L (ref 98–111)
Creatinine, Ser: 1.07 mg/dL (ref 0.61–1.24)
GFR calc Af Amer: >60 mL/min (ref >60)
GFR calc non Af Amer: >60 mL/min (ref >60)
Glucose, Bld: 311 mg/dL — ABNORMAL HIGH (ref 70–99)
Potassium: 4.2 mmol/L (ref 3.5–5.1)
Sodium: 139 mmol/L (ref 135–145)

## 2020-04-11 LAB — GLUCOSE, CAPILLARY
Glucose-Capillary: 358 mg/dL — ABNORMAL HIGH (ref 70–99)
Glucose-Capillary: 285 mg/dL — ABNORMAL HIGH (ref 70–99)

## 2020-04-11 LAB — CBC
HCT: 32.1 % — ABNORMAL LOW (ref 39.0–52.0)
Hemoglobin: 10.2 g/dL — ABNORMAL LOW (ref 13.0–17.0)
MCH: 27.1 pg (ref 26.0–34.0)
MCHC: 31.8 g/dL (ref 30.0–36.0)
MCV: 85.4 fL (ref 80.0–100.0)
Platelets: 251 10*3/uL (ref 150–400)
RBC: 3.76 MIL/uL — ABNORMAL LOW (ref 4.22–5.81)
RDW: 12.8 % (ref 11.5–15.5)
WBC: 7.4 10*3/uL (ref 4.0–10.5)
nRBC: 0 % (ref 0.0–0.2)

## 2020-04-11 LAB — HEMOGLOBIN A1C
Hgb A1c MFr Bld: 9 % — ABNORMAL HIGH (ref 4.8–5.6)
Mean Plasma Glucose: 211.6 mg/dL

## 2020-04-11 MED ORDER — ROSUVASTATIN CALCIUM 40 MG PO TABS: 40.0000 mg | ORAL_TABLET | Freq: Every day | ORAL | 11 refills | Status: DC

## 2020-04-11 MED ORDER — ACETAMINOPHEN 325 MG PO TABS: 650.0000 mg | ORAL_TABLET | ORAL | Status: AC | PRN

## 2020-04-11 MED ORDER — NITROGLYCERIN 0.4 MG SL SUBL: 0.4000 mg | SUBLINGUAL_TABLET | SUBLINGUAL | Status: DC | PRN

## 2020-04-11 MED ORDER — METFORMIN HCL 1000 MG PO TABS: 1000.0000 mg | ORAL_TABLET | Freq: Two times a day (BID) | ORAL | Status: DC

## 2020-04-11 MED ORDER — NITROGLYCERIN 0.4 MG SL SUBL: 0.4000 mg | SUBLINGUAL_TABLET | SUBLINGUAL | 4 refills | Status: AC | PRN

## 2020-04-11 MED ORDER — ROSUVASTATIN CALCIUM 20 MG PO TABS: 40.0000 mg | ORAL_TABLET | Freq: Every day | ORAL | Status: DC

## 2020-04-11 MED FILL — Lidocaine HCl Local Preservative Free (PF) Inj 1%: INTRAMUSCULAR | Qty: 30 | Status: AC

## 2020-04-11 NOTE — Progress Notes (Signed)
CARDIAC REHAB PHASE I   PRE:  Rate/Rhythm: 85 SR  BP:  Supine:   Sitting: 140/83  Standing:    SaO2: 97%RA  MODE:  Ambulation: 700 ft   POST:  Rate/Rhythm: 100 SR  BP:  Supine:   Sitting: 161/81  Standing:    SaO2: 98%RA 0805-0855 Pt walked 700 ft on RA with steady gait and no CP. Did stated he felt a little SOB but sats good on RA. Education completed with pt who voiced understanding. Stressed importance of plavix with stent. Reviewed NTG use, heart healthy and low carb food choices, walking for ex, and CRP 2. Referred to [place] CRP 2. Gave diabetic and heart healthy diets. To recliner after walk.   Graylon Good, RN BSN  04/11/2020 8:51 AM

## 2020-04-11 NOTE — Discharge Summary (Addendum)
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  2nd Mrg lesion is 40% stenosed.  Prox RCA lesion is 85% stenosed.  Ost LAD lesion is 40% stenosed.  2nd Diag lesion is 60% stenosed.  Post intervention, there is a 0% residual stenosis.  A drug-eluting stent was successfully placed using a STENT RESOLUTE ONYX 3.0X18.  Dist LAD-1 lesion is 30% stenosed.  Dist LAD-2 lesion is 70% stenosed.  Prox LAD lesion is 85% stenosed.  Post intervention, there is a 0% residual stenosis.  A drug-eluting stent was successfully placed using a STENT RESOLUTE ONYX 4.0X18.   Successful IVUS, angioplasty and drug-eluting stent placement to the mid and proximal LAD using 2 nonoverlapped stents.  Overall difficult procedure due to calcifications.  Intravascular lithotripsy was attempted but had to be aborted due to malfunction in the device which did not resolve after exchanging the catheter.  Recommendations: Dual antiplatelet therapy for at least 6 months but preferably long-term as tolerated given his residual disease. The patient requires aggressive medical therapy as he has diffuse calcified disease.  Diagnostic Dominance: Left  Intervention    _____________   History of  Present Illness     Reo Portela is a 59 y.o. male with hx of DM-2, HTN and calcium score of 1424 on Ca+ score CT and did note symptoms of chest pressure 3/10 with exertion of mowing grass.  DOE as well.  + premature FH of CAD with father MI at 36.  Pt was seen by Dr. Garen Lah and concern with ca+ score elevation, HTN, DM-2 and premature FH CAD pt was scheduled for cardiac cath.  Pt was agreeable.  Also with hx of HLD.     Cath prior to this admit 04/01/20 with Left dominant coronary arteries with significant one-vessel coronary artery disease involving the LAD.  The LAD has significant proximal and mid disease with significant calcifications.  The ostial LAD has moderate stenosis.  There is also significant proximal RCA stenosis but the vessel is small and nondominant.   Severe catheter induced radial artery spasm with inability to advance the catheter at the left ventricle for left ventricular angiography.  Best option for revascularization is likely IVUS guided PCI of the LAD likely with atherectomy or intravascular lithotripsy, with that plan patient was placed on dual antiplatelet therapy and plan for PCI at [facility].  Echo done prior to this admit with EF 65-70%.  No RWMA. And valves stable.   Presented to [facility] cath lab 04/10/20 for PCI.  See above.  Hospital Course     Consultants: none   Post procedure pt has done well.  No complications and this AM no chest pain or SOB.  He has ambulated with cardiac rehab without complications.  He still has residual disease of pRCA. Small non dominant  vessel- will treat medically.  DAPT fro 12 months.  Continue high dose statin.    Glucose is elevated will check A1c here and follow up at visit.  Will hold metformin for 48 hours then resume, but back on amaryl and byetta now. Will increase crestor as well.    EKG was SR and normal.   hgb A1C is 9 - have asked pt to see PCP in a week or so for improved management now with CAD.   Pt was seen and  evaluated by Dr. Radford Pax and found stable for discharge.  Has follow up with Dr. Synthia Innocent.  Did the patient have an acute coronary syndrome (MI, NSTEMI, STEMI, etc) this admission?:  No                               Did the patient have a percutaneous coronary intervention (stent / angioplasty)?:  Yes.     Cath/PCI Registry Performance & Quality Measures: 1. Aspirin prescribed? - Yes 2. ADP Receptor Inhibitor (Plavix/Clopidogrel, Brilinta/Ticagrelor or Effient/Prasugrel) prescribed (includes medically managed patients)? - Yes 3. High Intensity Statin (Lipitor 40-80mg  or Crestor 20-40mg ) prescribed? - Yes 4. For EF <40%, was ACEI/ARB prescribed? - Not Applicable (EF >/= 28%) 5. For EF <40%, Aldosterone Antagonist (Spironolactone or Eplerenone) prescribed? - Not Applicable (EF >/= 31%) 6. Cardiac Rehab Phase II ordered? - Yes   _____________  Discharge Vitals Blood pressure 140/83, pulse 79, temperature 97.8 F (36.6 C), temperature source Oral, resp. rate 17, height 5\' 7"  (1.702 m), weight 75.4 kg, SpO2 97 %.  Filed Weights   04/10/20 2108  Weight: 75.4 kg    Labs & Radiologic Studies    CBC Recent Labs    04/11/20 0213  WBC 7.4  HGB 10.2*  HCT 32.1*  MCV 85.4  PLT 517   Basic Metabolic Panel Recent Labs    04/11/20 0213  NA 139  K 4.2  CL 105  CO2 25  GLUCOSE 311*  BUN 18  CREATININE 1.07  CALCIUM 9.2   Liver Function Tests No results for input(s): AST, ALT, ALKPHOS, BILITOT, PROT, ALBUMIN in the last 72 hours. No results for input(s): LIPASE, AMYLASE in the last 72 hours. High Sensitivity Troponin:   No results for input(s): TROPONINIHS in the last 720 hours.  BNP Invalid input(s): POCBNP D-Dimer No results for input(s): DDIMER in the last 72 hours. Hemoglobin A1C No results for input(s): HGBA1C in the last 72 hours. Fasting Lipid Panel No results for input(s): CHOL, HDL, LDLCALC, TRIG, CHOLHDL, LDLDIRECT in the last 72 hours. Thyroid Function Tests No  results for input(s): TSH, T4TOTAL, T3FREE, THYROIDAB in the last 72 hours.  Invalid input(s): FREET3 _____________  CARDIAC CATHETERIZATION  Result Date: 04/10/2020  Mid LAD lesion is 85% stenosed.  2nd Mrg lesion is 40% stenosed.  Prox RCA lesion is 85% stenosed.  Ost LAD lesion is 40% stenosed.  2nd Diag lesion is 60% stenosed.  Post intervention, there is a 0% residual stenosis.  A drug-eluting stent was successfully placed using a STENT RESOLUTE ONYX 3.0X18.  Dist LAD-1 lesion is 30% stenosed.  Dist LAD-2 lesion is 70% stenosed.  Prox LAD lesion is 85% stenosed.  Post intervention, there is a 0% residual stenosis.  A drug-eluting stent was successfully placed using a STENT RESOLUTE ONYX 4.0X18.  Successful IVUS, angioplasty and drug-eluting stent placement to the mid and proximal LAD using 2 nonoverlapped stents.  Overall difficult procedure  due to calcifications.  Intravascular lithotripsy was attempted but had to be aborted due to malfunction in the device which did not resolve after exchanging the catheter. Recommendations: Dual antiplatelet therapy for at least 6 months but preferably long-term as tolerated given his residual disease. The patient requires aggressive medical therapy as he has diffuse calcified disease.   CARDIAC CATHETERIZATION  Result Date: 04/01/2020  Prox RCA lesion is 85% stenosed.  2nd Mrg lesion is 40% stenosed.  Ost LAD to Prox LAD lesion is 40% stenosed.  Prox LAD lesion is 85% stenosed.  Mid LAD lesion is 85% stenosed.  1.  Left dominant coronary arteries with significant one-vessel coronary artery disease involving the LAD.  The LAD has significant proximal and mid disease with significant calcifications.  The ostial LAD has moderate stenosis.  There is also significant proximal RCA stenosis but the vessel is small and nondominant. 2.  Severe catheter induced radial artery spasm with inability to advance the catheter at the left ventricle for left  ventricular angiography. Recommendations: Recommend aggressive medical therapy. Best option for revascularization is likely IVUS guided PCI of the LAD likely with atherectomy or intravascular lithotripsy. We will go ahead and start the patient on dual antiplatelet therapy and plan for PCI at [facility]. Avoid radial access given severe radial artery spasm. 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 He should get an echocardiogram done first.   CT CARDIAC SCORING  Addendum Date: 03/25/2020   ADDENDUM REPORT: 03/25/2020 17:58 CLINICAL DATA:  Risk stratification EXAM: Coronary Calcium Score TECHNIQUE: The patient was scanned on a Siemens go.Top Scanner. Axial non-contrast 3 mm slices were carried out through the heart. The data set was analyzed on a dedicated work station and scored using the [place]. FINDINGS: Non-cardiac: See separate report from [facility]. Ascending Aorta: Normal size Pericardium: Normal Coronary arteries: Normal origin of left and right coronary arteries. Distribution of arterial calcifications if present, as noted below; LM 0 LAD 1191 LCx 103 RCA 130 Total 1424 IMPRESSION: 1. High Coronary calcium score of 1424 . This was 99th percentile for age and sex matched control. 2. Coronary calcifications involving all 3 coronary branches 3. Recommend asa and statin if no contraindications. Kate Sable Electronically Signed   By: Kate Sable M.D.   On: 03/25/2020 17:58   Result Date: 03/25/2020 EXAM: OVER-READ INTERPRETATION  CT CHEST The following report is an over-read performed by radiologist Dr. Vinnie Langton of [facility], [place] on 03/22/2020. This over-read does not include interpretation of cardiac or coronary anatomy or pathology. The coronary calcium score interpretation by the cardiologist is attached. COMPARISON:  None. FINDINGS: Aortic atherosclerosis. Mosaic attenuation throughout the lung parenchyma suggesting air  trapping within the visualized portions of the thorax there are no suspicious appearing pulmonary nodules or masses, there is no acute consolidative airspace disease, no pleural effusions, no pneumothorax and no lymphadenopathy. Visualized portions of the upper abdomen are unremarkable. There are no aggressive appearing lytic or blastic lesions noted in the visualized portions of the skeleton. IMPRESSION: 1.  Aortic Atherosclerosis (ICD10-I70.0). 2. Probable air trapping in the lungs, suggestive of small airways disease. Electronically Signed: By: Vinnie Langton M.D. On: 03/22/2020 16:25   ECHOCARDIOGRAM COMPLETE  Result Date: 04/09/2020    ECHOCARDIOGRAM REPORT   Patient Name:   HENDRIX YURKOVICH Date of Exam: 04/09/2020 Medical Rec #:  [id number]        Height:       52.0  in Accession #:    [id number]       Weight:       164.0 lb Date of Birth:  1960/11/26         BSA:          1.859 m Patient Age:    39 years         BP:           126/72 mmHg Patient Gender: M                HR:           84 bpm. Exam Location:  [place] Procedure: 2D Echo, Cardiac Doppler and Color Doppler Indications:    R07.9* Chest pain, unspecified  History:        Patient has no prior history of Echocardiogram examinations.                 CAD, Signs/Symptoms:Chest Pain; Risk Factors:Diabetes,                 Hypertension and Former Smoker. Asthma.  Sonographer:    Pilar Jarvis RDMS, RVT, RDCS Referring Phys: [id number] BRIAN AGBOR-ETANG IMPRESSIONS  1. Left ventricular ejection fraction, by estimation, is 65 to 70%. The left ventricle has normal function. The left ventricle has no regional wall motion abnormalities. Left ventricular diastolic parameters were normal.  2. Right ventricular systolic function is normal. The right ventricular size is normal. Tricuspid regurgitation signal is inadequate for assessing PA pressure.  3. The mitral valve is normal in structure. No evidence of mitral valve regurgitation. No evidence of mitral  stenosis.  4. The aortic valve is tricuspid. Aortic valve regurgitation is not visualized. No aortic stenosis is present.  5. The inferior vena cava is normal in size with greater than 50% respiratory variability, suggesting right atrial pressure of 3 mmHg. FINDINGS  Left Ventricle: Left ventricular ejection fraction, by estimation, is 65 to 70%. The left ventricle has normal function. The left ventricle has no regional wall motion abnormalities. The left ventricular internal cavity size was normal in size. There is  borderline left ventricular hypertrophy. Left ventricular diastolic parameters were normal. Right Ventricle: The right ventricular size is normal. No increase in right ventricular wall thickness. Right ventricular systolic function is normal. Tricuspid regurgitation signal is inadequate for assessing PA pressure. Left Atrium: Left atrial size was normal in size. Right Atrium: Right atrial size was normal in size. Pericardium: There is no evidence of pericardial effusion. Mitral Valve: The mitral valve is normal in structure. No evidence of mitral valve regurgitation. No evidence of mitral valve stenosis. Tricuspid Valve: The tricuspid valve is normal in structure. Tricuspid valve regurgitation is trivial. Aortic Valve: The aortic valve is tricuspid. Aortic valve regurgitation is not visualized. No aortic stenosis is present. Aortic valve mean gradient measures 4.0 mmHg. Aortic valve peak gradient measures 12.2 mmHg. Aortic valve area, by VTI measures 4.47  cm. Pulmonic Valve: The pulmonic valve was normal in structure. Pulmonic valve regurgitation is not visualized. No evidence of pulmonic stenosis. Aorta: The aortic root and ascending aorta are structurally normal, with no evidence of dilitation. Pulmonary Artery: The pulmonary artery is of normal size. Venous: The inferior vena cava is normal in size with greater than 50% respiratory variability, suggesting right atrial pressure of 3 mmHg.  IAS/Shunts: No atrial level shunt detected by color flow Doppler.  LEFT VENTRICLE PLAX 2D LVIDd:         4.50 cm  Diastology LVIDs:         3.30 cm     LV e' medial:    9.14 cm/s LV PW:         1.20 cm     LV E/e' medial:  10.8 LV IVS:        1.00 cm     LV e' lateral:   7.07 cm/s LVOT diam:     2.40 cm     LV E/e' lateral: 14.0 LV SV:         109 LV SV Index:   59 LVOT Area:     4.52 cm  LV Volumes (MOD) LV vol d, MOD A2C: 58.7 ml LV vol d, MOD A4C: 72.4 ml LV vol s, MOD A2C: 25.0 ml LV vol s, MOD A4C: 33.4 ml LV SV MOD A2C:     33.7 ml LV SV MOD A4C:     72.4 ml LV SV MOD BP:      37.9 ml RIGHT VENTRICLE             IVC RV Basal diam:  4.20 cm     IVC diam: 1.90 cm RV S prime:     16.40 cm/s TAPSE (M-mode): 2.9 cm LEFT ATRIUM             Index       RIGHT ATRIUM           Index LA diam:        3.00 cm 1.61 cm/m  RA Area:     13.80 cm LA Vol (A2C):   30.1 ml 16.19 ml/m RA Volume:   34.60 ml  18.62 ml/m LA Vol (A4C):   43.2 ml 23.24 ml/m LA Biplane Vol: 40.1 ml 21.57 ml/m  AORTIC VALVE                   PULMONIC VALVE AV Area (Vmax):    3.62 cm    PV Vmax:       1.08 m/s AV Area (Vmean):   4.02 cm    PV Peak grad:  4.7 mmHg AV Area (VTI):     4.47 cm AV Vmax:           175.00 cm/s AV Vmean:          93.300 cm/s AV VTI:            0.245 m AV Peak Grad:      12.2 mmHg AV Mean Grad:      4.0 mmHg LVOT Vmax:         140.00 cm/s LVOT Vmean:        82.900 cm/s LVOT VTI:          0.242 m LVOT/AV VTI ratio: 0.99  AORTA Ao Root diam: 3.00 cm Ao Asc diam:  3.10 cm Ao Arch diam: 3.0 cm MITRAL VALVE MV Area (PHT): 3.53 cm    SHUNTS MV Decel Time: 215 msec    Systemic VTI:  0.24 m MV E velocity: 99.00 cm/s  Systemic Diam: 2.40 cm MV A velocity: 88.70 cm/s MV E/A ratio:  1.12 Harrell Gave End MD Electronically signed by Nelva Bush MD Signature Date/Time: 04/09/2020/6:21:37 PM    Final    Disposition   Pt is being discharged home today in good condition.  Follow-up Plans & Appointments   Take 1 NTG, under  your tongue, while sitting.  If no relief of pain may repeat NTG, one tab every 5 minutes up to  3 tablets total over 15 minutes.  If no relief CALL 911.  If you have dizziness/lightheadness  while taking NTG, stop taking and call 911.        Heart Healthy Diabetic Diet   Call [facility] at [phone] if any bleeding, swelling or drainage at cath site.  May shower, no tub baths for 48 hours for groin sticks. No lifting over 5 pounds for 3 days.  No Driving for 3 days  Do not take Metformin until 04/13/20 - it may interact with cath dye.    Do not miss asprin or plavix they keep the stent open  If you bruise a lot may need to stop ginko and Vit E but not the plavix or asprin.       Follow-up Information    Kate Sable, MD Follow up on 04/19/2020.   Specialties: Cardiology, Radiology Why: at 11:40 AM Contact information: [place] [address] [phone]        Adin Hector, MD. Call.   Specialty: Internal Medicine Why: call to make an appt should be seen for Diabetes management with new heart disease  Contact information: [place] [address] [phone]              Discharge Instructions    Amb Referral to Cardiac Rehabilitation   Complete by: As directed    Diagnosis: Coronary Stents   After initial evaluation and assessments completed: Virtual Based Care may be provided alone or in conjunction with Phase 2 Cardiac Rehab based on patient barriers.: Yes      Discharge Medications   Allergies as of 04/11/2020      Reactions   Atorvastatin    Other reaction(s): Muscle Pain      Medication List    STOP taking these medications   celecoxib 200 MG capsule Commonly known as: CELEBREX     TAKE these medications   acetaminophen 325 MG tablet Commonly known as: TYLENOL Take 2 tablets (650 mg total) by mouth every 4 (four) hours as needed for headache or mild  pain.   albuterol 108 (90 Base) MCG/ACT inhaler Commonly known as: VENTOLIN HFA Inhale 2 puffs into the lungs every 6 (six) hours as needed for wheezing or shortness of breath.   Allergy Rel Child (Cetirizine) 5 MG/5ML Soln Generic drug: cetirizine HCl Take 5 mg by mouth daily.   cetirizine 5 MG tablet Commonly known as: ZYRTEC Take 5 mg by mouth daily.   amLODipine-valsartan 10-320 MG tablet Commonly known as: EXFORGE Take 1 tablet by mouth daily.   ascorbic acid 1000 MG tablet Commonly known as: VITAMIN C Take 1,000 mg by mouth daily.   aspirin 81 MG tablet Take 81 mg by mouth daily.   budesonide-formoterol 160-4.5 MCG/ACT inhaler Commonly known as: SYMBICORT Inhale 2 puffs into the lungs daily as needed (Shortness of breath or wheezing).   Byetta 10 MCG Pen 10 MCG/0.04ML Sopn injection Generic drug: exenatide Inject into the skin 2 (two) times daily with a meal. 2.4 ml   Cholecalciferol 25 MCG (1000 UT) tablet Take 1,000 Units by mouth daily.   clobetasol cream 0.05 % Commonly known as: TEMOVATE Apply 1 application topically 2 (two) times daily as needed (Psoriasis).   clopidogrel 75 MG tablet Commonly known as: PLAVIX Take 1 tablet (75 mg total) by mouth daily.   Ginkgo Biloba 60 MG Caps Take 60 mg by mouth in the morning and  at bedtime.   glimepiride 4 MG tablet Commonly known as: AMARYL Take 4 mg by mouth daily with breakfast.   Humira Pen-Ps/UV/Adol HS Start 40 MG/0.8ML Pnkt Generic drug: Adalimumab Inject 0.4 mLs into the skin every 14 (fourteen) days.   KRILL OIL PO Take 325 mg by mouth daily.   metFORMIN 1000 MG tablet Commonly known as: GLUCOPHAGE Take 1 tablet (1,000 mg total) by mouth 2 (two) times daily with a meal. Start taking on: April 13, 2020 What changed: These instructions start on April 13, 2020. If you are unsure what to do until then, ask your doctor or other care provider.   nitroGLYCERIN 0.4 MG SL tablet Commonly known as:  NITROSTAT Place 1 tablet (0.4 mg total) under the tongue every 5 (five) minutes as needed for chest pain.   rosuvastatin 40 MG tablet Commonly known as: CRESTOR Take 1 tablet (40 mg total) by mouth at bedtime. What changed:   medication strength  how much to take  when to take this   triamcinolone 55 MCG/ACT Aero nasal inhaler Commonly known as: NASACORT Place 1 spray into the nose daily as needed (Congestion).   vitamin E 180 MG (400 UNITS) capsule Take 400 Units by mouth daily.   Zinc 30 MG Tabs Take 30 mg by mouth daily.          Outstanding Labs/Studies   Recheck Hepatic and lipid in 6 weeks.  Duration of Discharge Encounter   Greater than 30 minutes including physician time.  Signed, Cecilie Kicks, NP 04/11/2020, 10:50 AM

## 2020-04-11 NOTE — Progress Notes (Signed)
Progress Note  Patient Name: Jesse Ray Date of Encounter: 04/11/2020  Primary Cardiologist: No primary care provider on file.   Subjective   Doing well s/p PCI of the LAD yesterday.  Denies any chest pain or SOB.    Inpatient Medications    Scheduled Meds:  amLODipine  10 mg Oral Daily   And   irbesartan  300 mg Oral Daily   ascorbic acid  1,000 mg Oral Daily   aspirin EC  81 mg Oral Daily   cholecalciferol  1,000 Units Oral Daily   clopidogrel  75 mg Oral Daily   glimepiride  4 mg Oral Q breakfast   loratadine  10 mg Oral Daily   rosuvastatin  20 mg Oral QHS   sodium chloride flush  3 mL Intravenous Q12H   sodium chloride flush  3 mL Intravenous Q12H   vitamin E  400 Units Oral Daily   Continuous Infusions:  sodium chloride     PRN Meds: sodium chloride, acetaminophen, albuterol, ondansetron (ZOFRAN) IV, sodium chloride flush, triamcinolone   Vital Signs    Vitals:   04/10/20 2300 04/11/20 0300 04/11/20 0400 04/11/20 0802  BP: 128/86 105/68 101/73 140/83  Pulse: 76  75 79  Resp: 16 17 15 17   Temp: 97.9 F (36.6 C)  97.9 F (36.6 C) 97.8 F (36.6 C)  TempSrc: Oral  Oral Oral  SpO2: 98%  97% 97%  Weight:      Height:        Intake/Output Summary (Last 24 hours) at 04/11/2020 0935 Last data filed at 04/11/2020 0430 Gross per 24 hour  Intake 734.08 ml  Output 1900 ml  Net -1165.92 ml   Filed Weights   04/10/20 2108  Weight: 75.4 kg    Telemetry    NSR - Personally Reviewed  ECG    No new EKG to review - Personally Reviewed  Physical Exam   GEN: No acute distress.   Neck: No JVD Cardiac: RRR, no murmurs, rubs, or gallops.  Respiratory: Clear to auscultation bilaterally. GI: Soft, nontender, non-distended  MS: No edema; No deformity. Right radial artery cath site clean and dry with no hematoma or ecchymosis Neuro:  Nonfocal  Psych: Normal affect   Labs    Chemistry Recent Labs  Lab 04/08/20 1011 04/11/20 0213  NA  138 139  K 4.5 4.2  CL 103 105  CO2 26 25  GLUCOSE 255* 311*  BUN 29* 18  CREATININE 1.07 1.07  CALCIUM 9.2 9.2  GFRNONAA >60 >60  GFRAA >60 >60  ANIONGAP 9 9     Hematology Recent Labs  Lab 04/08/20 1011 04/11/20 0213  WBC 7.2 7.4  RBC 4.12* 3.76*  HGB 11.6* 10.2*  HCT 34.8* 32.1*  MCV 84.5 85.4  MCH 28.2 27.1  MCHC 33.3 31.8  RDW 12.7 12.8  PLT 265 251    Cardiac EnzymesNo results for input(s): TROPONINI in the last 168 hours. No results for input(s): TROPIPOC in the last 168 hours.   BNPNo results for input(s): BNP, PROBNP in the last 168 hours.   DDimer No results for input(s): DDIMER in the last 168 hours.   Radiology    CARDIAC CATHETERIZATION  Result Date: 04/10/2020  Mid LAD lesion is 85% stenosed.  2nd Mrg lesion is 40% stenosed.  Prox RCA lesion is 85% stenosed.  Ost LAD lesion is 40% stenosed.  2nd Diag lesion is 60% stenosed.  Post intervention, there is a 0% residual stenosis.  A drug-eluting  stent was successfully placed using a STENT RESOLUTE ONYX 3.0X18.  Dist LAD-1 lesion is 30% stenosed.  Dist LAD-2 lesion is 70% stenosed.  Prox LAD lesion is 85% stenosed.  Post intervention, there is a 0% residual stenosis.  A drug-eluting stent was successfully placed using a STENT RESOLUTE ONYX 4.0X18.  Successful IVUS, angioplasty and drug-eluting stent placement to the mid and proximal LAD using 2 nonoverlapped stents.  Overall difficult procedure due to calcifications.  Intravascular lithotripsy was attempted but had to be aborted due to malfunction in the device which did not resolve after exchanging the catheter. Recommendations: Dual antiplatelet therapy for at least 6 months but preferably long-term as tolerated given his residual disease. The patient requires aggressive medical therapy as he has diffuse calcified disease.   ECHOCARDIOGRAM COMPLETE  Result Date: 04/09/2020    ECHOCARDIOGRAM REPORT   Patient Name:   Jesse Ray Date of Exam:  04/09/2020 Medical Rec #:  [id number]        Height:       67.0 in Accession #:    [id number]       Weight:       164.0 lb Date of Birth:  [birth year]         BSA:          1.859 m Patient Age:    59 years         BP:           126/72 mmHg Patient Gender: M                HR:           84 bpm. Exam Location:  [place] Procedure: 2D Echo, Cardiac Doppler and Color Doppler Indications:    R07.9* Chest pain, unspecified  History:        Patient has no prior history of Echocardiogram examinations.                 CAD, Signs/Symptoms:Chest Pain; Risk Factors:Diabetes,                 Hypertension and Former Smoker. Asthma.  Sonographer:    Pilar Jarvis RDMS, RVT, RDCS Referring Phys: [id number] BRIAN AGBOR-ETANG IMPRESSIONS  1. Left ventricular ejection fraction, by estimation, is 65 to 70%. The left ventricle has normal function. The left ventricle has no regional wall motion abnormalities. Left ventricular diastolic parameters were normal.  2. Right ventricular systolic function is normal. The right ventricular size is normal. Tricuspid regurgitation signal is inadequate for assessing PA pressure.  3. The mitral valve is normal in structure. No evidence of mitral valve regurgitation. No evidence of mitral stenosis.  4. The aortic valve is tricuspid. Aortic valve regurgitation is not visualized. No aortic stenosis is present.  5. The inferior vena cava is normal in size with greater than 50% respiratory variability, suggesting right atrial pressure of 3 mmHg. FINDINGS  Left Ventricle: Left ventricular ejection fraction, by estimation, is 65 to 70%. The left ventricle has normal function. The left ventricle has no regional wall motion abnormalities. The left ventricular internal cavity size was normal in size. There is  borderline left ventricular hypertrophy. Left ventricular diastolic parameters were normal. Right Ventricle: The right ventricular size is normal. No increase in right ventricular wall thickness. Right  ventricular systolic function is normal. Tricuspid regurgitation signal is inadequate for assessing PA pressure. Left Atrium: Left atrial size was normal in size. Right Atrium: Right atrial size was normal in size. Pericardium: There is  no evidence of pericardial effusion. Mitral Valve: The mitral valve is normal in structure. No evidence of mitral valve regurgitation. No evidence of mitral valve stenosis. Tricuspid Valve: The tricuspid valve is normal in structure. Tricuspid valve regurgitation is trivial. Aortic Valve: The aortic valve is tricuspid. Aortic valve regurgitation is not visualized. No aortic stenosis is present. Aortic valve mean gradient measures 4.0 mmHg. Aortic valve peak gradient measures 12.2 mmHg. Aortic valve area, by VTI measures 4.47  cm. Pulmonic Valve: The pulmonic valve was normal in structure. Pulmonic valve regurgitation is not visualized. No evidence of pulmonic stenosis. Aorta: The aortic root and ascending aorta are structurally normal, with no evidence of dilitation. Pulmonary Artery: The pulmonary artery is of normal size. Venous: The inferior vena cava is normal in size with greater than 50% respiratory variability, suggesting right atrial pressure of 3 mmHg. IAS/Shunts: No atrial level shunt detected by color flow Doppler.  LEFT VENTRICLE PLAX 2D LVIDd:         4.50 cm     Diastology LVIDs:         3.30 cm     LV e' medial:    9.14 cm/s LV PW:         1.20 cm     LV E/e' medial:  10.8 LV IVS:        1.00 cm     LV e' lateral:   7.07 cm/s LVOT diam:     2.40 cm     LV E/e' lateral: 14.0 LV SV:         109 LV SV Index:   59 LVOT Area:     4.52 cm  LV Volumes (MOD) LV vol d, MOD A2C: 58.7 ml LV vol d, MOD A4C: 72.4 ml LV vol s, MOD A2C: 25.0 ml LV vol s, MOD A4C: 33.4 ml LV SV MOD A2C:     33.7 ml LV SV MOD A4C:     72.4 ml LV SV MOD BP:      37.9 ml RIGHT VENTRICLE             IVC RV Basal diam:  4.20 cm     IVC diam: 1.90 cm RV S prime:     16.40 cm/s TAPSE (M-mode): 2.9 cm LEFT  ATRIUM             Index       RIGHT ATRIUM           Index LA diam:        3.00 cm 1.61 cm/m  RA Area:     13.80 cm LA Vol (A2C):   30.1 ml 16.19 ml/m RA Volume:   34.60 ml  18.62 ml/m LA Vol (A4C):   43.2 ml 23.24 ml/m LA Biplane Vol: 40.1 ml 21.57 ml/m  AORTIC VALVE                   PULMONIC VALVE AV Area (Vmax):    3.62 cm    PV Vmax:       1.08 m/s AV Area (Vmean):   4.02 cm    PV Peak grad:  4.7 mmHg AV Area (VTI):     4.47 cm AV Vmax:           175.00 cm/s AV Vmean:          93.300 cm/s AV VTI:            0.245 m AV Peak Grad:  12.2 mmHg AV Mean Grad:      4.0 mmHg LVOT Vmax:         140.00 cm/s LVOT Vmean:        82.900 cm/s LVOT VTI:          0.242 m LVOT/AV VTI ratio: 0.99  AORTA Ao Root diam: 3.00 cm Ao Asc diam:  3.10 cm Ao Arch diam: 3.0 cm MITRAL VALVE MV Area (PHT): 3.53 cm    SHUNTS MV Decel Time: 215 msec    Systemic VTI:  0.24 m MV E velocity: 99.00 cm/s  Systemic Diam: 2.40 cm MV A velocity: 88.70 cm/s MV E/A ratio:  1.12 Nelva Bush MD Electronically signed by Nelva Bush MD Signature Date/Time: 04/09/2020/6:21:37 PM    Final     Cardiac Studies   Cardiac Cath 04/01/2020 Conclusion    Prox RCA lesion is 85% stenosed.  2nd Mrg lesion is 40% stenosed.  Ost LAD to Prox LAD lesion is 40% stenosed.  Prox LAD lesion is 85% stenosed.  Mid LAD lesion is 85% stenosed.   1.  Left dominant coronary arteries with significant one-vessel coronary artery disease involving the LAD.  The LAD has significant proximal and mid disease with significant calcifications.  The ostial LAD has moderate stenosis.  There is also significant proximal RCA stenosis but the vessel is small and nondominant. 2.  Severe catheter induced radial artery spasm with inability to advance the catheter at the left ventricle for left ventricular angiography.  Recommendations: Recommend aggressive medical therapy. Best option for revascularization is likely IVUS guided PCI of the LAD likely  with atherectomy or intravascular lithotripsy. We will go ahead and start the patient on dual antiplatelet therapy and plan for PCI at [facility]. Avoid radial access given severe radial artery spasm. 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 He should get an echocardiogram done first.   Coronary PCI 04/10/2020 Conclusion    Mid LAD lesion is 85% stenosed.  2nd Mrg lesion is 40% stenosed.  Prox RCA lesion is 85% stenosed.  Ost LAD lesion is 40% stenosed.  2nd Diag lesion is 60% stenosed.  Post intervention, there is a 0% residual stenosis.  A drug-eluting stent was successfully placed using a STENT RESOLUTE ONYX 3.0X18.  Dist LAD-1 lesion is 30% stenosed.  Dist LAD-2 lesion is 70% stenosed.  Prox LAD lesion is 85% stenosed.  Post intervention, there is a 0% residual stenosis.  A drug-eluting stent was successfully placed using a STENT RESOLUTE ONYX 4.0X18.   Successful IVUS, angioplasty and drug-eluting stent placement to the mid and proximal LAD using 2 nonoverlapped stents.  Overall difficult procedure due to calcifications.  Intravascular lithotripsy was attempted but had to be aborted due to malfunction in the device which did not resolve after exchanging the catheter.  Recommendations: Dual antiplatelet therapy for at least 6 months but preferably long-term as tolerated given his residual disease. The patient requires aggressive medical therapy as he has diffuse calcified disease.   Patient Profile     59 y.o. male with a hx of diabetes, hypertension, hyperlipidemia who presents due to abnormal coronary calcium score.  Patient underwent coronary calcium score on 03/2020 for restratification.  His score was noted to be elevated at 1424, which was 99th percentile for age and gender matched controls (MESA data).  He presented with CP and SOB and cath showed severe 2 vessel CAD.  Admitted for PCI.   Assessment & Plan  1.   ASCAD -coronary calcium score done for cardiac risk stratification and was elevated at 1424 -admitted with CP and SOB with normal hsTrop -cath on 04/01/2020 with 85% pRCA, 40% OM2, 40% oLAD, 85% pLAD, 85% mLAD -s/p PCI of the proximal and mid LAD with residual 70% dLAD  -he still has residual proximal RCA disease in a small non-dominant vessel and medical management has been recommended.   -continue DAPT with ASA 81mg  daily and Plavix 75mg  daily -continue high statin   2.  HTN -BP controlled -continue Amlodipine 10mg  daily, Irbesartan 300mg  daily -SCr stable post cath at 1.07  3.  DM 2 -BS are in the 255-311 range -check HbA1C prior to discharge and will need to be followed up on at next OV -continue Amaryl 4mg  daily and Byetta BID -restart Metformin 1000mg  BID on 10/2 -followup with PCP in the next 1-2 weeks -will ask diabetes coordinator for guidance on management prior to discharge today  4.  HLD -LDL goal < 70 -LDL 96 this admit -increase Crestor to 40mg  daily -repeat FLP and ALT in 6 weeks  Patient is stable for discharge home today with followup TOC in 7-10 days.  Will need FLp and ALT in 6 weeks.  Needs appt with PCP ASAP for DM.    I have spent a total of 40 minutes with patient reviewing cardiac cath, cardiac PCI , telemetry, EKGs, labs and examining patient as well as establishing an assessment and plan that was discussed with the patient.  > 50% of time was spent in direct patient care.    For questions or updates, please contact [place] Please consult [email or web address] for contact info under Cardiology/STEMI.      Signed, Fransico Him, MD  04/11/2020, 9:35 AM

## 2020-04-11 NOTE — Progress Notes (Signed)
Received diabetes coordinator consult. Noted that patient's blood sugars have been greater than 200 mg/dl in the last 24 hours. Patient has not received any medications for diabetes since admission.   In Care Everywhere, the patient's last HgbA1C was 7.5% on January 05, 2020. Recommend continuing home diabetes medications: Byetta 2.4 ml BID, Metformin 1000 mg BID, and Amaryl 4 mg at breakfast. Needs to follow up with PCP Dr. Caryl Comes after discharge.   Harvel Ricks RN BSN CDE Diabetes Coordinator Pager: [phone]  8am-5pm

## 2020-04-11 NOTE — Discharge Instructions (Signed)
Take 1 NTG, under your tongue, while sitting.  If no relief of pain may repeat NTG, one tab every 5 minutes up to 3 tablets total over 15 minutes.  If no relief CALL 911.  If you have dizziness/lightheadness  while taking NTG, stop taking and call 911.        Heart Healthy Diabetic Diet   Call [facility] at [phone] if any bleeding, swelling or drainage at cath site.  May shower, no tub baths for 48 hours for groin sticks. No lifting over 5 pounds for 3 days.  No Driving for 3 days  Do not take Metformin until 04/13/20 - it may interact with cath dye.    Do not miss asprin or plavix they keep the stent open  If you bruise a lot may need to stop ginko and Vit E but not the plavix or asprin.

## 2020-04-11 NOTE — Progress Notes (Signed)
Pt had 14 beat run of v-tach. Asymptomatic. VS stable. MD made aware. Will continue to monitor.

## 2020-04-11 NOTE — Plan of Care (Signed)

## 2020-04-11 NOTE — Progress Notes (Signed)
Inpatient Diabetes Program Recommendations  AACE/ADA: New Consensus Statement on Inpatient Glycemic Control (2015)  Target Ranges:  Prepandial:   less than 140 mg/dL      Peak postprandial:   less than 180 mg/dL (1-2 hours)      Critically ill patients:  140 - 180 mg/dL   Lab Results  Component Value Date   CYELYH 909 (H) 04/11/2020    Review of Glycemic Control  Diabetes history: type 2 Outpatient Diabetes medications: Byetta 2.4 ml BID, Amaryl 4 mg at breakfast, Metformin 1000 mg BID Current orders for Inpatient glycemic control: Amaryl 4 mg at breakfast  Inpatient Diabetes Program Recommendations:   Noted that blood sugars continue to be greater than 200 mg/dl.   Recommend Novolog MODERATE correction scale TID & HS scale while in the hospital.   Harvel Ricks RN BSN CDE Diabetes Coordinator Pager: [phone]  8am-5pm

## 2020-04-12 ENCOUNTER — Ambulatory Visit: Payer: BC Managed Care – PPO | Admitting: Cardiology

## 2020-04-18 NOTE — Telephone Encounter (Signed)
Faxed all forms to [place] on 04/10/20

## 2020-04-19 ENCOUNTER — Ambulatory Visit (INDEPENDENT_AMBULATORY_CARE_PROVIDER_SITE_OTHER): Payer: BC Managed Care – PPO | Admitting: Family

## 2020-04-19 ENCOUNTER — Other Ambulatory Visit: Payer: Self-pay

## 2020-04-19 ENCOUNTER — Encounter: Payer: Self-pay | Admitting: Family

## 2020-04-19 ENCOUNTER — Ambulatory Visit: Payer: BC Managed Care – PPO | Admitting: Cardiology

## 2020-04-19 VITALS — BP 122/72 | HR 78 | Ht 67.0 in | Wt 168.0 lb

## 2020-04-19 DIAGNOSIS — Z79899 Other long term (current) drug therapy: Secondary | ICD-10-CM | POA: Diagnosis not present

## 2020-04-19 DIAGNOSIS — I25118 Atherosclerotic heart disease of native coronary artery with other forms of angina pectoris: Secondary | ICD-10-CM

## 2020-04-19 DIAGNOSIS — E1165 Type 2 diabetes mellitus with hyperglycemia: Secondary | ICD-10-CM

## 2020-04-19 DIAGNOSIS — I10 Essential (primary) hypertension: Secondary | ICD-10-CM

## 2020-04-19 DIAGNOSIS — E785 Hyperlipidemia, unspecified: Secondary | ICD-10-CM | POA: Diagnosis not present

## 2020-04-19 DIAGNOSIS — Z8249 Family history of ischemic heart disease and other diseases of the circulatory system: Secondary | ICD-10-CM

## 2020-04-19 MED ORDER — METOPROLOL SUCCINATE ER 25 MG PO TB24: 12.5000 mg | ORAL_TABLET | Freq: Every day | ORAL | 1 refills | Status: DC

## 2020-04-19 NOTE — Patient Instructions (Addendum)
Medication Instructions:  Your physician has recommended you make the following change in your medication:   START Metoprolol Succinate 12.5mg  daily  *If you need a refill on your cardiac medications before your next appointment, please call your pharmacy*   Lab Work: Your physician recommends that you return for lab work November 8-12 at the [facility] for fasting lipid panel and CMP  If you have labs (blood work) drawn today and your tests are completely normal, you will receive your results only by: [name] MyChart Message (if you have MyChart) OR . A paper copy in the mail If you have any lab test that is abnormal or we need to change your treatment, we will call you to review the results.   Testing/Procedures: Your EKG today shows normal sinus rhythm. This is a good result!  Follow-Up: At [facility], you and your health needs are our priority.  As part of our continuing mission to provide you with exceptional heart care, we have created designated Provider Care Teams.  These Care Teams include your primary Cardiologist (physician) and Advanced Practice Providers (APPs -  Physician Assistants and Nurse Practitioners) who all work together to provide you with the care you need, when you need it.  We recommend signing up for the patient portal called "MyChart".  Sign up information is provided on this After Visit Summary.  MyChart is used to connect with patients for Virtual Visits (Telemedicine).  Patients are able to view lab/test results, encounter notes, upcoming appointments, etc.  Non-urgent messages can be sent to your provider as well.   To learn more about what you can do with MyChart, go to [email or web address].    Your next appointment:   5-6 week(s)  The format for your next appointment:   In Person  Provider:   You may see Kate Sable, MD or one of the following Advanced Practice Providers on your designated Care Team:    Murray Hodgkins, NP  Christell Faith,  PA-C  Marrianne Mood, PA-C  Cadence Kathlen Mody, [place]  Laurann Montana, NP  Other Instructions  We will route note to [place] that you may return to work 05/06/20.  Recommend applying heat to the right groin site. It should gradually decrease in size.

## 2020-04-19 NOTE — Progress Notes (Signed)
Office Visit    Patient Name: Jesse Ray Date of Encounter: 04/19/2020  Primary Care Provider:  Adin Hector, MD Primary Cardiologist:  Kate Sable, MD Electrophysiologist:  None   Chief Complaint    Jesse Ray is a 59 y.o. male with a hx of CAD, HTN, DM2, HLD presents today for follow up after cardiac catheterization.   Past Medical History    Past Medical History:  Diagnosis Date  . Coronary artery disease   . Diabetes mellitus without complication ([place])   . HLD (hyperlipidemia) 04/11/2020  . Hyperlipidemia   . Hypertension   . Psoriatic arthritis ([place])   . S/P angioplasty with stent 04/10/30 of proximal and mLAD DES and residual LAD stenosis 04/11/2020   Past Surgical History:  Procedure Laterality Date  . CORONARY STENT INTERVENTION N/A 04/10/2020   Procedure: CORONARY STENT INTERVENTION;  Surgeon: Wellington Hampshire, MD;  Location: [place] [facility];  Service: Cardiovascular;  Laterality: N/A;  LAD   . INTRAVASCULAR ULTRASOUND/IVUS N/A 04/10/2020   Procedure: Intravascular Ultrasound/IVUS;  Surgeon: Wellington Hampshire, MD;  Location: [place] [facility];  Service: Cardiovascular;  Laterality: N/A;  LAD  . LEFT HEART CATH AND CORONARY ANGIOGRAPHY N/A 04/01/2020   Procedure: LEFT HEART CATH AND CORONARY ANGIOGRAPHY;  Surgeon: Wellington Hampshire, MD;  Location: [place] [facility];  Service: Cardiovascular;  Laterality: N/A;    Allergies  Allergies  Allergen Reactions  . Atorvastatin     Other reaction(s): Muscle Pain    History of Present Illness    Jesse Ray is a 59 y.o. male with a hx of CAD s/p angioplaty with stent 04/10/20 of mLAD DES and residual LAD stenosis, HTN, DM2 with diabetic nephropathy, HLD last seen for stent intervention 04/10/20.  Seen in consult by Dr. Garen Lah for chest pain. He had calcium score of 1424 on CT. History of premature coronary disease with father having MI at 58. Subsequent cardiac cath 04/01/20 with left  dominant coronary arteries with significant one-vessel CAD in LAD. Significant prox and mid disease with calcifications. Ostial LAD moderate stenosis. Significant prox RCA stenosis but vessel small and nondominant. Severe catheter induced radial artery spasm with inability to advance catheter at LV for LV angiography. He was placed on DAPT and recommended for PCI at [facility]. Echo 03/2020 with LVEF 65-70%, no RWMA, no significant valvular abnormalities. On 04/10/20 he underwent successful IVUS, angioplasty, and DES to mid and prox LAD using 2 non-overlapped stents. Procedure difficult due to calcifications. Recommended for DAPTx6 months ideally longer given residual disease. Aggressive medical therapy recommended due to diffusely calcified disease.  Presents today for follow up with his wife. Reviewed catheterization report and procedure. He does note a "knot" at the right groin site that is sore but not painful. Reports no RLE numbness. No chest pain, pressure, tightness. Does note he gets lightheaded with position changes or with bending over. No chest pain, pressure, tightness. DOE stable at baseline. He is tolerating his cardiac medications well. Does note mild bruising with Plavix but not bothersome. He has been walking daily at home for 11 minutes without difficulty.   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 He presently is scheduled to go back to work 05/04/20.   EKGs/Labs/Other Studies Reviewed:   The following studies were reviewed today:  04/10/20 cardiac cath and PCI  Mid LAD lesion is 85% stenosed.  2nd Mrg lesion is  40% stenosed.  Prox RCA lesion is 85% stenosed.  Ost LAD lesion is 40% stenosed.  2nd Diag lesion is 60% stenosed.  Post intervention, there is a 0% residual stenosis.  A drug-eluting stent was successfully placed using a STENT RESOLUTE ONYX 3.0X18.  Dist LAD-1 lesion is 30% stenosed.  Dist LAD-2  lesion is 70% stenosed.  Prox LAD lesion is 85% stenosed.  Post intervention, there is a 0% residual stenosis.  A drug-eluting stent was successfully placed using a STENT RESOLUTE ONYX 4.0X18.   Successful IVUS, angioplasty and drug-eluting stent placement to the mid and proximal LAD using 2 nonoverlapped stents.  Overall difficult procedure due to calcifications.  Intravascular lithotripsy was attempted but had to be aborted due to malfunction in the device which did not resolve after exchanging the catheter.   Recommendations: Dual antiplatelet therapy for at least 6 months but preferably long-term as tolerated given his residual disease. The patient requires aggressive medical therapy as he has diffuse calcified disease.   Diagnostic Dominance: Left  Intervention      _____________  Echo 04/10/20 1. Left ventricular ejection fraction, by estimation, is 65 to 70%. The  left ventricle has normal function. The left ventricle has no regional  wall motion abnormalities. Left ventricular diastolic parameters were  normal.   2. Right ventricular systolic function is normal. The right ventricular  size is normal. Tricuspid regurgitation signal is inadequate for assessing  PA pressure.   3. The mitral valve is normal in structure. No evidence of mitral valve  regurgitation. No evidence of mitral stenosis.   4. The aortic valve is tricuspid. Aortic valve regurgitation is not  visualized. No aortic stenosis is present.   5. The inferior vena cava is normal in size with greater than 50%  respiratory variability, suggesting right atrial pressure of 3 mmHg.   EKG:  EKG is ordered today.  The ekg ordered today demonstrates normal sinus rhythm 78 bpm.  Recent Labs: 04/11/2020: BUN 18; Creatinine, Ser 1.07; Hemoglobin 10.2; Platelets 251; Potassium 4.2; Sodium 139  Recent Lipid Panel    Component Value Date/Time   CHOL 169 03/28/2020 1025   TRIG 197 (H) 03/28/2020 1025   HDL 39 (L)  03/28/2020 1025   CHOLHDL 4.3 03/28/2020 1025   LDLCALC 96 03/28/2020 1025    Home Medications   Current Meds  Medication Sig  . acetaminophen (TYLENOL) 325 MG tablet Take 2 tablets (650 mg total) by mouth every 4 (four) hours as needed for headache or mild pain.  . Adalimumab (HUMIRA PEN-PSORIASIS STARTER) 40 MG/0.8ML PNKT Inject 0.4 mLs into the skin every 14 (fourteen) days.   [name] albuterol (VENTOLIN HFA) 108 (90 Base) MCG/ACT inhaler Inhale 2 puffs into the lungs every 6 (six) hours as needed for wheezing or shortness of breath.   [name] amLODipine-valsartan (EXFORGE) 10-320 MG tablet Take 1 tablet by mouth daily.  [name] ascorbic acid (VITAMIN C) 1000 MG tablet Take 1,000 mg by mouth daily.   [name] aspirin 81 MG tablet Take 81 mg by mouth daily.  . budesonide-formoterol (SYMBICORT) 160-4.5 MCG/ACT inhaler Inhale 2 puffs into the lungs daily as needed (Shortness of breath or wheezing).   . cetirizine (ZYRTEC) 5 MG tablet Take 5 mg by mouth daily.  . cetirizine HCl (ALLERGY REL CHILD, CETIRIZINE,) 5 MG/5ML SOLN Take 5 mg by mouth daily.   . Cholecalciferol 25 MCG (1000 UT) tablet Take 1,000 Units by mouth daily.   . clobetasol cream (TEMOVATE) 2.56 % Apply 1 application  topically 2 (two) times daily as needed (Psoriasis).   . clopidogrel (PLAVIX) 75 MG tablet Take 1 tablet (75 mg total) by mouth daily.  [name] exenatide (BYETTA 10 MCG PEN) 10 MCG/0.04ML SOPN injection Inject into the skin 2 (two) times daily with a meal. 2.4 ml  . Ginkgo Biloba 60 MG CAPS Take 60 mg by mouth in the morning and at bedtime.   [name] glimepiride (AMARYL) 4 MG tablet Take 4 mg by mouth daily with breakfast.  . KRILL OIL PO Take 325 mg by mouth daily.  . metFORMIN (GLUCOPHAGE) 1000 MG tablet Take 1 tablet (1,000 mg total) by mouth 2 (two) times daily with a meal.  . nitroGLYCERIN (NITROSTAT) 0.4 MG SL tablet Place 1 tablet (0.4 mg total) under the tongue every 5 (five) minutes as needed for chest pain.  . rosuvastatin (CRESTOR) 40 MG  tablet Take 1 tablet (40 mg total) by mouth at bedtime.  . triamcinolone (NASACORT) 55 MCG/ACT AERO nasal inhaler Place 1 spray into the nose daily as needed (Congestion).   . vitamin E 180 MG (400 UNITS) capsule Take 400 Units by mouth daily.   . Zinc 30 MG TABS Take 30 mg by mouth daily.      Review of Systems  All other systems reviewed and are otherwise negative except as noted above.  Physical Exam    VS:  BP 122/72 (BP Location: Left Arm, Patient Position: Sitting, Cuff Size: Normal)   Pulse 78   Ht 5\' 7"  (1.702 m)   Wt 168 lb (76.2 kg)   SpO2 98%   BMI 26.31 kg/m  , BMI Body mass index is 26.31 kg/m. GEN: Well nourished, well developed, in no acute distress. HEENT: normal. Neck: Supple, no JVD, carotid bruits, or masses. Cardiac: RRR, no murmurs, rubs, or gallops. No clubbing, cyanosis, edema.  Radials/DP/PT 2+ and equal bilaterally.  Respiratory:  Respirations regular and unlabored, clear to auscultation bilaterally. GI: Soft, nontender, nondistended, BS + x 4. MS: No deformity or atrophy. Skin: Warm and dry, no rash. R groin site with 1" in diameter hematoma. No ecchymosis. Tender on palpation, but femoral pulse 2+ and distal pedal pulse 2+. Neuro:  Strength and sensation are intact. Psych: Normal affect.  Assessment & Plan    1. CAD - S/p DES x2 to LAD 04/10/20 with residual disease. Encouraged to participate in cardiac rehab, referral previously placed. GDMT includes DAPT for at least 6 months (ideally longer given residual disease) with Aspirin/Plavix, Crestor, PRN nitroglycerin. For optimization of GDMT, start Toprol 12.5mg  daily.  Right groin site with 1" in diameter hematoma, mildly  tender on palpation, no signs of infection. Femoral and distal pulse 2+. Recommend he utilize heat and monitor carefully. Discussed that it will likely take 1-2 weeks to resolve and to call our office if it gets larger.   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 I have encouraged him to discuss time to attend cardiac rehab 3 times per week for one hour for 12 weeks with his employer. Will route note to [place]   2. HTN - BP well controlled. Continue current antihypertensive regimen which includes Amlodipine-Valsartan 10-320mg  daily. Add Toprol, as above.   3. HLD, LDL goal <70 - Continue Crestor 40mg  daily.  Lipid panel/CMP the week of  Nov 8-12 at [facility] as he will have completed 6 weeks of statin therapy.  4. DM2 - A1c goal <7. 04/11/20 A1c 9.0. Upcoming follow up with PCP. Consider utilization of Jardiance for cardioprotective benefit.   Disposition: Follow up in 6 weeks with Dr. Garen Lah or APP.   Loel Dubonnet, NP 04/19/2020, 10:00 PM

## 2020-04-22 ENCOUNTER — Encounter: Payer: Self-pay | Admitting: Family

## 2020-04-29 ENCOUNTER — Encounter: Payer: Self-pay | Admitting: Family

## 2020-04-29 NOTE — Progress Notes (Signed)
Faxed to [facility] 04/29/20 (fax # [phone]) (claim #[id number])  Loel Dubonnet, NP

## 2020-05-03 ENCOUNTER — Telehealth: Payer: Self-pay | Admitting: Family

## 2020-05-03 ENCOUNTER — Encounter: Payer: Self-pay | Admitting: Family

## 2020-05-03 NOTE — Telephone Encounter (Signed)
Patient wife calling back stating after further conversations employer's doctor is requiring an echo as well.  Please advise patient.  There is a 4 o clock opening at this time for an echo today

## 2020-05-03 NOTE — Telephone Encounter (Signed)
I called [facility] at [facility] regarding DOT clearance as multiple calls from patient's wife regarding clearance.  There was confusion as they did not have his echocardiogram result. No indication for echo nor ETT nor echo at this time. I have faxed these results to [place]. Instructed to have Mr. Cybulski return this afternoon to their center to get his DOT card today. Called his wife to make her aware. Appreciative of call.   ---  Previous cardiac history:   Cardiac CTA ordered for chest pain with elevated calcium score. He underwent planned cardiac cath 04/01/20 left dominant coronary arteries with significant one-vessel CAD in LAD. Significant prox and mid disease with calcifications. Ostial LAD moderate stenosis. Significant prox RCA stenosis but vessel small and nondominant. Severe catheter induced radial artery spasm with inability to advance catheter at LV for LV angiography He was placed on DAPT and recommended for echocardiogram prior to PCI. Echo 04/09/20 with LVEF 65-70%, no RWMA, no significant valvular abnormalities. DESx2 to LAD on 04/10/20. Office visit 04/19/20 tolerating cardiac medications, taking as prescribed, no anginal symptoms, EKG with no acute ST/T wave changes.   Per [place] DOT regulations: He may return to work without restriction 10/25/21as he is s/p PCI/DES with no anginal symptoms, EKG is without acute ST/T wave changes, and he is tolerating cardiac medications without difficulty. He is compliant with all of his medications.  Per [place] DOT regulations ETT (exercise tolerance test) is not required until 3-6 months after catheterization. This would not be recommended nor required until 07/10/20 at the absolute earliest. Barry Dienes, NP

## 2020-05-03 NOTE — Telephone Encounter (Signed)
Patient wife calling back stating patient was preparing to return to work and was told he must have a stress test. Patient cannot return until this is completed  Please advise at [phone], wife Clarene Critchley ASAP

## 2020-05-03 NOTE — Telephone Encounter (Signed)
See updated telephone encounter 05/03/20. Resolved.   Loel Dubonnet, NP

## 2020-05-19 ENCOUNTER — Encounter: Payer: Self-pay | Admitting: Family

## 2020-05-20 ENCOUNTER — Encounter: Payer: Self-pay | Admitting: Family

## 2020-05-24 ENCOUNTER — Other Ambulatory Visit
Admission: RE | Admit: 2020-05-24 | Discharge: 2020-05-24 | Disposition: A | Discharge status: Home / Self Care | Payer: BC Managed Care – PPO | Attending: Family | Admitting: Family

## 2020-05-24 DIAGNOSIS — I25118 Atherosclerotic heart disease of native coronary artery with other forms of angina pectoris: Secondary | ICD-10-CM | POA: Insufficient documentation

## 2020-05-24 DIAGNOSIS — Z79899 Other long term (current) drug therapy: Secondary | ICD-10-CM | POA: Diagnosis present

## 2020-05-24 DIAGNOSIS — E785 Hyperlipidemia, unspecified: Secondary | ICD-10-CM | POA: Diagnosis present

## 2020-05-24 DIAGNOSIS — I10 Essential (primary) hypertension: Secondary | ICD-10-CM

## 2020-05-24 LAB — COMPREHENSIVE METABOLIC PANEL
ALT: 35 U/L (ref 0–44)
AST: 35 U/L (ref 15–41)
Albumin: 4 g/dL (ref 3.5–5.0)
Alkaline Phosphatase: 93 U/L (ref 38–126)
Anion gap: 10 (ref 5–15)
BUN: 20 mg/dL (ref 6–20)
CO2: 27 mmol/L (ref 22–32)
Calcium: 9.5 mg/dL (ref 8.9–10.3)
Chloride: 102 mmol/L (ref 98–111)
Creatinine, Ser: 1.2 mg/dL (ref 0.61–1.24)
GFR, Estimated: >60 mL/min (ref >60)
Glucose, Bld: 164 mg/dL — ABNORMAL HIGH (ref 70–99)
Potassium: 4 mmol/L (ref 3.5–5.1)
Sodium: 139 mmol/L (ref 135–145)
Total Bilirubin: 0.4 mg/dL (ref 0.3–1.2)
Total Protein: 7.5 g/dL (ref 6.5–8.1)

## 2020-05-24 LAB — LIPID PANEL
Cholesterol: 92 mg/dL (ref 0–200)
HDL: 36 mg/dL — ABNORMAL LOW (ref >40)
LDL Cholesterol: 44 mg/dL (ref 0–99)
Total CHOL/HDL Ratio: 2.6 RATIO
Triglycerides: 59 mg/dL (ref <150)
VLDL: 12 mg/dL (ref 0–40)

## 2020-05-31 ENCOUNTER — Encounter: Payer: Self-pay | Admitting: Family

## 2020-05-31 ENCOUNTER — Other Ambulatory Visit: Payer: Self-pay

## 2020-05-31 ENCOUNTER — Ambulatory Visit (INDEPENDENT_AMBULATORY_CARE_PROVIDER_SITE_OTHER): Payer: BC Managed Care – PPO | Admitting: Family

## 2020-05-31 VITALS — BP 118/70 | HR 76 | Ht 67.0 in | Wt 173.2 lb

## 2020-05-31 DIAGNOSIS — E785 Hyperlipidemia, unspecified: Secondary | ICD-10-CM

## 2020-05-31 DIAGNOSIS — E1165 Type 2 diabetes mellitus with hyperglycemia: Secondary | ICD-10-CM

## 2020-05-31 DIAGNOSIS — I25118 Atherosclerotic heart disease of native coronary artery with other forms of angina pectoris: Secondary | ICD-10-CM | POA: Diagnosis not present

## 2020-05-31 DIAGNOSIS — I10 Essential (primary) hypertension: Secondary | ICD-10-CM | POA: Diagnosis not present

## 2020-05-31 NOTE — Patient Instructions (Addendum)
Medication Instructions:  No medication changes today.   HOLD your Metoprolol the morning of your stress test.  *If you need a refill on your cardiac medications before your next appointment, please call your pharmacy*   Lab Work: None ordered today  Testing/Procedures: Your EKG today shows normal sinus rhythm.   Your physician has requested that you have an exercise tolerance test. For further information please visit [email or web address]. Please also follow instruction sheet, as given.     Follow-Up: At [facility], you and your health needs are our priority.  As part of our continuing mission to provide you with exceptional heart care, we have created designated Provider Care Teams.  These Care Teams include your primary Cardiologist (physician) and Advanced Practice Providers (APPs -  Physician Assistants and Nurse Practitioners) who all work together to provide you with the care you need, when you need it.  We recommend signing up for the patient portal called "MyChart".  Sign up information is provided on this After Visit Summary.  MyChart is used to connect with patients for Virtual Visits (Telemedicine).  Patients are able to view lab/test results, encounter notes, upcoming appointments, etc.  Non-urgent messages can be sent to your provider as well.   To learn more about what you can do with MyChart, go to [email or web address].    Your next appointment:   4 month(s)  The format for your next appointment:   In Person  Provider:   You may see Kate Sable, MD or one of the following Advanced Practice Providers on your designated Care Team:    Murray Hodgkins, NP  Christell Faith, PA-C  Marrianne Mood, PA-C  Cadence Kathlen Mody, [place]  Laurann Montana, NP  Other Instructions  For [facility] coupon card: [email or web address]    Exercise Stress Test  An exercise stress test is a test that is done to collect information about how your  heart functions during exercise. The test is done while you are walking on a treadmill or using an exercise bike. The goal is to raise your heart rate and "stress" the heart. The heart is evaluated before, during, and after you exercise. An electrocardiogram (ECG) will be used to monitor the heart, and your blood pressure will also be monitored. In some cases, nuclear scanning or an ultrasound of the heart (echocardiogram) will also be done to evaluate your heart. An exercise stress test is done to look for coronary artery disease (CAD). The test may also be done to:  Evaluate your limits of exercise during cardiac rehabilitation.  Check for high blood pressure during exercise.  Check how well you can exercise after such treatments as coronary stenting or new medicines.  Check for problems with blood flow to your arms and legs during exercise. If you have an abnormal test result, this may mean that you are not getting enough blood flow to your heart during exercise. More testing may be needed to understand why your test was not normal. Tell a health care provider about:  Any allergies you have.  All medicines you are taking, including vitamins, herbs, eye drops, creams, and over-the-counter medicines.  Any blood disorders you have.  Any surgeries you have had.  Any medical conditions you have.  Whether you are pregnant or may be pregnant. What are the risks? Generally, this is a safe procedure. However, problems may occur, including:  Pain or pressure in the following areas: ? Chest. ? Jaw or neck. ? Between your shoulder blades. ? Down your left arm.  Dizziness or lightheadedness.  Shortness of breath.  Increased or irregular heartbeats.  Nausea or vomiting.  Heart attack (rare).  Life-threatening abnormal heart rhythm (rare). What happens before the procedure?  Follow instructions from your health care provider about eating or drinking restrictions. ? You may be told  to avoid all forms of caffeine for 24 hours before the test. This includes coffee, tea (even decaffeinated tea), caffeinated sodas, chocolate, cocoa, and certain pain medicines.  Ask your health care provider about: ? Taking over-the-counter medicines, vitamins, herbs, and supplements. ? Changing or stopping your regular medicines. This is especially important if you are taking diabetes medicines or beta-blocker medicines.  If you have diabetes, ask how you are to take your insulin or pills. It is common to adjust your insulin dose the morning of the test.  If you are taking beta-blocker medicines, it is important to talk to your health care provider about these medicines well before the date of your test. Taking beta-blocker medicines may interfere with the test. In some cases, these medicines may need to be changed or stopped 24 hours or more before the test.  If you wear a nitroglycerin patch, it may need to be removed prior to the test. Ask your health care provider if the patch should be removed before the test.  If you use an inhaler for any breathing condition, bring it with you to the test.  Do not apply lotions, powders, creams, or oils on your chest prior to the test.  Wear loose-fitting clothes and comfortable walking shoes.  Do not use any products that contain nicotine or tobacco, such as cigarettes and e-cigarettes, for 4 hours before the test or as told by your health care provider. If you need help quitting, ask your health care provider. What happens during the procedure?  Multiple electrodes will be attached to your chest.  Multiple wires will be attached to the electrodes. These will transfer the electrical impulses from your heart to the ECG machine. Your heart will be monitored both at rest and while exercising.  If you are also having an echocardiogram or nuclear scanning, images of your heart will be taken before and after you exercise.  A blood pressure cuff will be  placed around your arm to measure your blood pressure throughout the test. You will feel it tighten and loosen throughout the test.  You will walk on a treadmill or use a stationary bike. If you cannot use these, you may be asked to turn a crank with your hands.  You will start at a slow pace or level on the exercise machine. The exercise difficulty will be slowly increased to raise your heart rate. In the case of a treadmill, the speed and incline will gradually be increased.  You may be asked to periodically breathe into a tube. This measures the gases you breathe out.  You will be asked how you are feeling throughout the test. You will be asked to rate your level of exertion.  Tell the staff right away if you feel: ? Chest pain. ? Dizziness. ? Shortness of breath. ? Too fatigued to continue. ? Pain or aching in your legs or arms.  You will exercise until you have symptoms or until you reach a target heart rate. The test will also be stopped if you have changes in your blood pressure or ECG readings, or if you develop an irregular heartbeat (arrhythmia). The procedure may vary among health care providers and hospitals. What happens after  the procedure?  You will sit down and recover from the exercise. Your blood pressure, heart rate, and ECG will be monitored until you recover.  You may return to your normal schedule, including diet, activities, and medicines, unless your health care provider tells you otherwise.  It is up to you to get your test results. Ask your health care provider, or the department that is doing the test, when your results will be ready. Summary  An exercise stress test is a test that is done to collect information about how your heart functions during exercise.  This test is done to look for coronary artery disease (CAD).  During this test, you will walk on a treadmill or use an exercise bike to raise your heart rate.  It is important to follow instructions  from your health care provider about eating and drinking restrictions before the test. This may include avoiding caffeine and certain medicines before the test. This information is not intended to replace advice given to you by your health care provider. Make sure you discuss any questions you have with your health care provider. Document Revised: 04/01/2017 Document Reviewed: 09/02/2016 Elsevier Patient Education  2020 [employer].

## 2020-05-31 NOTE — Progress Notes (Signed)
Office Visit    Patient Name: Jesse Ray Date of Encounter: 05/31/2020  Primary Care Provider:  Adin Hector, MD Primary Cardiologist:  Kate Sable, MD Electrophysiologist:  None   Chief Complaint    Jesse Ray is a 59 y.o. male with a hx of CAD, HTN, DM2, HLD presents today for follow up of CAD  Past Medical History    Past Medical History:  Diagnosis Date  . Coronary artery disease   . Diabetes mellitus without complication ([place])   . HLD (hyperlipidemia) 04/11/2020  . Hyperlipidemia   . Hypertension   . Psoriatic arthritis ([place])   . S/P angioplasty with stent 04/10/30 of proximal and mLAD DES and residual LAD stenosis 04/11/2020   Past Surgical History:  Procedure Laterality Date  . CORONARY STENT INTERVENTION N/A 04/10/2020   Procedure: CORONARY STENT INTERVENTION;  Surgeon: Wellington Hampshire, MD;  Location: [place] [facility];  Service: Cardiovascular;  Laterality: N/A;  LAD   . INTRAVASCULAR ULTRASOUND/IVUS N/A 04/10/2020   Procedure: Intravascular Ultrasound/IVUS;  Surgeon: Wellington Hampshire, MD;  Location: [facility] [place] [facility];  Service: Cardiovascular;  Laterality: N/A;  LAD  . LEFT HEART CATH AND CORONARY ANGIOGRAPHY N/A 04/01/2020   Procedure: LEFT HEART CATH AND CORONARY ANGIOGRAPHY;  Surgeon: Wellington Hampshire, MD;  Location: [facility] [facility];  Service: Cardiovascular;  Laterality: N/A;    Allergies  Allergies  Allergen Reactions  . Atorvastatin     Other reaction(s): Muscle Pain    History of Present Illness    Jesse Ray is a 59 y.o. male with a hx of CAD s/p angioplaty with stent 04/10/20 of mLAD DES and residual LAD stenosis, HTN, DM2 with diabetic nephropathy, HLD last seen 04/10/20.  Seen in consult by Dr. Garen Lah for chest pain. He had calcium score of 1424 on CT. History of premature coronary disease with father having MI at 20. Subsequent cardiac cath 04/01/20 with left dominant coronary arteries with significant  one-vessel CAD in LAD. Significant prox and mid disease with calcifications. Ostial LAD moderate stenosis. Significant prox RCA stenosis but vessel small and nondominant. Severe catheter induced radial artery spasm with inability to advance catheter at LV for LV angiography. He was placed on DAPT and recommended for [facility] at [facility]. Echo 03/2020 with LVEF 65-70%, no RWMA, no significant valvular abnormalities. On 04/10/20 he underwent successful IVUS, angioplasty, and DES to mid and prox LAD using 2 non-overlapped stents. Procedure difficult due to calcifications. Recommended for DAPTx6 months ideally longer given residual disease. Aggressive medical therapy recommended due to diffusely calcified disease.  He was seen 04/19/20 for follow up after stent and doing well. He was walking 11 minutes daily at home without difficulty. 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   Presents today for follow up with his wife. He is doing well. Trying to be active at work and getting some walking in. Endorses eating heart healthy diet. Reports no shortness of breath nor dyspnea on exertion. Reports no chest pain, pressure, or tightness. No edema, orthopnea, PND. Reports no palpitations.   EKGs/Labs/Other Studies Reviewed:   The following studies were reviewed today:  04/10/20 cardiac cath and PCI  Mid LAD lesion is 85% stenosed.  2nd Mrg lesion is 40% stenosed.  Prox RCA lesion is 85% stenosed.  Ost LAD lesion is 40% stenosed.  2nd Diag lesion is 60% stenosed.  Post intervention, there is a 0% residual stenosis.  A drug-eluting stent was successfully  placed using a STENT RESOLUTE ONYX 3.0X18.  Dist LAD-1 lesion is 30% stenosed.  Dist LAD-2 lesion is 70% stenosed.  Prox LAD lesion is 85% stenosed.  Post intervention, there is a 0% residual stenosis.  A drug-eluting stent was successfully placed using a STENT RESOLUTE ONYX 4.0X18.   Successful IVUS, angioplasty and drug-eluting  stent placement to the mid and proximal LAD using 2 nonoverlapped stents.  Overall difficult procedure due to calcifications.  Intravascular lithotripsy was attempted but had to be aborted due to malfunction in the device which did not resolve after exchanging the catheter.   Recommendations: Dual antiplatelet therapy for at least 6 months but preferably long-term as tolerated given his residual disease. The patient requires aggressive medical therapy as he has diffuse calcified disease.   Diagnostic Dominance: Left  Intervention      _____________  Echo 04/10/20 1. Left ventricular ejection fraction, by estimation, is 65 to 70%. The  left ventricle has normal function. The left ventricle has no regional  wall motion abnormalities. Left ventricular diastolic parameters were  normal.   2. Right ventricular systolic function is normal. The right ventricular  size is normal. Tricuspid regurgitation signal is inadequate for assessing  PA pressure.   3. The mitral valve is normal in structure. No evidence of mitral valve  regurgitation. No evidence of mitral stenosis.   4. The aortic valve is tricuspid. Aortic valve regurgitation is not  visualized. No aortic stenosis is present.   5. The inferior vena cava is normal in size with greater than 50%  respiratory variability, suggesting right atrial pressure of 3 mmHg.   EKG:  EKG is ordered today.  The ekg ordered today demonstrates normal sinus rhythm 76 bpm with incomplete RBBB. Stable compared to previous.   Recent Labs: 04/11/2020: Hemoglobin 10.2; Platelets 251 05/24/2020: ALT 35; BUN 20; Creatinine, Ser 1.20; Potassium 4.0; Sodium 139  Recent Lipid Panel    Component Value Date/Time   CHOL 92 05/24/2020 1116   CHOL 169 03/28/2020 1025   TRIG 59 05/24/2020 1116   HDL 36 (L) 05/24/2020 1116   HDL 39 (L) 03/28/2020 1025   CHOLHDL 2.6 05/24/2020 1116   VLDL 12 05/24/2020 1116   LDLCALC 44 05/24/2020 1116   LDLCALC 96 03/28/2020  1025    Home Medications   Current Meds  Medication Sig  . acetaminophen (TYLENOL) 325 MG tablet Take 2 tablets (650 mg total) by mouth every 4 (four) hours as needed for headache or mild pain.  . Adalimumab (HUMIRA PEN-PSORIASIS STARTER) 40 MG/0.8ML PNKT Inject 0.4 mLs into the skin every 14 (fourteen) days.   [name] albuterol (VENTOLIN HFA) 108 (90 Base) MCG/ACT inhaler Inhale 2 puffs into the lungs every 6 (six) hours as needed for wheezing or shortness of breath.   [name] amLODipine-valsartan (EXFORGE) 10-320 MG tablet Take 1 tablet by mouth daily.  [name] ascorbic acid (VITAMIN C) 1000 MG tablet Take 1,000 mg by mouth daily.   [name] aspirin 81 MG tablet Take 81 mg by mouth daily.  . budesonide-formoterol (SYMBICORT) 160-4.5 MCG/ACT inhaler Inhale 2 puffs into the lungs daily as needed (Shortness of breath or wheezing).   . cetirizine (ZYRTEC) 5 MG tablet Take 5 mg by mouth daily.  . Cholecalciferol 25 MCG (1000 UT) tablet Take 1,000 Units by mouth daily.   . clobetasol cream (TEMOVATE) 3.53 % Apply 1 application topically 2 (two) times daily as needed (Psoriasis).   . clopidogrel (PLAVIX) 75 MG tablet Take 1 tablet (75 mg  total) by mouth daily.  [name] exenatide (BYETTA 10 MCG PEN) 10 MCG/0.04ML SOPN injection Inject into the skin 2 (two) times daily with a meal. 2.4 ml  . FARXIGA 5 MG TABS tablet Take 5 mg by mouth daily.  . Ginkgo Biloba 60 MG CAPS Take 60 mg by mouth in the morning and at bedtime.   [name] glimepiride (AMARYL) 4 MG tablet Take 4 mg by mouth daily with breakfast.  . KRILL OIL PO Take 325 mg by mouth daily.  . metFORMIN (GLUCOPHAGE) 1000 MG tablet Take 1 tablet (1,000 mg total) by mouth 2 (two) times daily with a meal.  . metoprolol succinate (TOPROL XL) 25 MG 24 hr tablet Take 0.5 tablets (12.5 mg total) by mouth daily.  . nitroGLYCERIN (NITROSTAT) 0.4 MG SL tablet Place 1 tablet (0.4 mg total) under the tongue every 5 (five) minutes as needed for chest pain.  . rosuvastatin (CRESTOR) 40 MG  tablet Take 1 tablet (40 mg total) by mouth at bedtime.  . triamcinolone (NASACORT) 55 MCG/ACT AERO nasal inhaler Place 1 spray into the nose daily as needed (Congestion).   [name] VITAMIN D PO Take by mouth daily.  . vitamin E 180 MG (400 UNITS) capsule Take 400 Units by mouth daily.   . Zinc 30 MG TABS Take 30 mg by mouth daily.      Review of Systems  All other systems reviewed and are otherwise negative except as noted above.  Physical Exam    VS:  BP 118/70 (BP Location: Left Arm, Patient Position: Sitting, Cuff Size: Normal)   Pulse 76   Ht 5\' 7"  (1.702 m)   Wt 173 lb 4 oz (78.6 kg)   SpO2 99%   BMI 27.13 kg/m  , BMI Body mass index is 27.13 kg/m. GEN: Well nourished, well developed, in no acute distress. HEENT: normal. Neck: Supple, no JVD, carotid bruits, or masses. Cardiac: RRR, no murmurs, rubs, or gallops. No clubbing, cyanosis, edema.  Radials/DP/PT 2+ and equal bilaterally.  Respiratory:  Respirations regular and unlabored, clear to auscultation bilaterally. GI: Soft, nontender, nondistended, BS + x 4. MS: No deformity or atrophy. Skin: Warm and dry, no rash.  Neuro:  Strength and sensation are intact. Psych: Normal affect.  Assessment & Plan   1. CAD - S/p DES x2 to LAD 04/10/20 with residual disease. GDMT includes DAPT for at least 6 months (ideally longer given residual disease) with Aspirin/Plavix, Crestor, Toprol, PRN nitroglycerin. Stable with no anginal symptoms. Tolerating cardiac medications without difficulty.   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 Will schedule GXT for 06/2020. Will then be required every 2 years per DOT guidelines. As his EF 04/09/20 prior to stenting was 65-70%, no indication for repeat echocardiogram.   2. HTN - BP well controlled. Continue current antihypertensive regimen.   3. HLD, LDL goal <70 - Lipid panel 05/24/20 with LDL 44.  Continue Rosuvastatin 40mg  daily.   4. DM2 - A1c goal <7. 04/11/20 A1c 9.0. Encouraged to start [place] as prescribed by PCP. Appreciate inclusion of SLGT2i for cardioprotective benefit.   Disposition: Follow up in 4 month(s) with Dr. Garen Lah or APP.   Loel Dubonnet, NP 05/31/2020, 8:38 AM

## 2020-06-27 ENCOUNTER — Other Ambulatory Visit
Admission: RE | Admit: 2020-06-27 | Discharge: 2020-06-27 | Disposition: A | Discharge status: Home / Self Care | Payer: BC Managed Care – PPO | Source: Ambulatory Visit | Attending: Cardiology | Admitting: Cardiology

## 2020-06-27 ENCOUNTER — Other Ambulatory Visit: Payer: Self-pay

## 2020-06-27 DIAGNOSIS — Z01812 Encounter for preprocedural laboratory examination: Secondary | ICD-10-CM | POA: Diagnosis present

## 2020-06-27 DIAGNOSIS — Z20822 Contact with and (suspected) exposure to covid-19: Secondary | ICD-10-CM | POA: Diagnosis not present

## 2020-06-27 LAB — SARS CORONAVIRUS 2 (TAT 6-24 HRS): SARS Coronavirus 2: NEGATIVE

## 2020-06-28 ENCOUNTER — Ambulatory Visit (INDEPENDENT_AMBULATORY_CARE_PROVIDER_SITE_OTHER): Payer: BC Managed Care – PPO

## 2020-06-28 ENCOUNTER — Encounter: Payer: Self-pay | Admitting: Family

## 2020-06-28 DIAGNOSIS — I25118 Atherosclerotic heart disease of native coronary artery with other forms of angina pectoris: Secondary | ICD-10-CM | POA: Diagnosis not present

## 2020-06-28 LAB — EXERCISE TOLERANCE TEST
Estimated workload: 9 METS
Exercise duration (min): 7 min
Exercise duration (sec): 21 s
MPHR: 163 {beats}/min
Peak HR: 146 {beats}/min
Percent HR: 89 %
RPE: 17
Rest HR: 88 {beats}/min

## 2020-07-31 ENCOUNTER — Encounter: Payer: Self-pay | Admitting: Family

## 2020-08-03 ENCOUNTER — Other Ambulatory Visit: Payer: Self-pay | Admitting: Family

## 2020-09-22 ENCOUNTER — Encounter: Payer: Self-pay | Admitting: Family

## 2020-09-23 MED ORDER — METOPROLOL SUCCINATE ER 25 MG PO TB24: ORAL_TABLET | ORAL | 0 refills | Status: DC

## 2020-09-23 MED ORDER — CLOPIDOGREL BISULFATE 75 MG PO TABS: 75.0000 mg | ORAL_TABLET | Freq: Every day | ORAL | 0 refills | Status: DC

## 2020-09-27 ENCOUNTER — Encounter: Payer: Self-pay | Admitting: Cardiology

## 2020-09-27 ENCOUNTER — Ambulatory Visit (INDEPENDENT_AMBULATORY_CARE_PROVIDER_SITE_OTHER): Payer: BC Managed Care – PPO | Admitting: Cardiology

## 2020-09-27 ENCOUNTER — Other Ambulatory Visit: Payer: Self-pay

## 2020-09-27 VITALS — BP 104/52 | HR 80 | Ht 68.0 in | Wt 168.0 lb

## 2020-09-27 DIAGNOSIS — E785 Hyperlipidemia, unspecified: Secondary | ICD-10-CM

## 2020-09-27 DIAGNOSIS — I25118 Atherosclerotic heart disease of native coronary artery with other forms of angina pectoris: Secondary | ICD-10-CM | POA: Diagnosis not present

## 2020-09-27 DIAGNOSIS — I10 Essential (primary) hypertension: Secondary | ICD-10-CM | POA: Diagnosis not present

## 2020-09-27 NOTE — Patient Instructions (Signed)

## 2020-09-27 NOTE — Progress Notes (Signed)
Cardiology Office Note:    Date:  09/27/2020   ID:  Jesse Ray, DOB [birth year], MRN [id number]  PCP:  Adin Hector, MD  [facility] Cardiologist:  Kate Sable, MD  [place] Electrophysiologist:  None   Referring MD: Adin Hector, MD   Chief Complaint  Patient presents with  . Other    4 month follow up - Meds reviewed verbally with patient.     History of Present Illness:    Jesse Ray is a 60 y.o. male with a hx of CAD s/p PCI x 2 to LAD 03/2020, diabetes, hypertension, hyperlipidemia who presents for follow-up.  Previously seen for chest pain, underwent left heart cath with stent placement.  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  Underwent ETT 06/28/2020 with no evidence for inducible ischemia.  He denies chest pain or shortness of breath, tolerating dual antiplatelets without any adverse effects.  Has no concerns at this time.  Prior notes Left heart cath 04/10/2020 status post PCI x2 to proximal and mid LAD.  Proximal RCA 85% small nondominant artery, diagonal to 60% Echocardiogram 3/53/6144 normal systolic and diastolic function, EF 31% ETT 06/28/2020 no evidence for inducible ischemia  Past Medical History:  Diagnosis Date  . Coronary artery disease   . Diabetes mellitus without complication ([place])   . HLD (hyperlipidemia) 04/11/2020  . Hyperlipidemia   . Hypertension   . Psoriatic arthritis ([place])   . S/P angioplasty with stent 04/10/30 of proximal and mLAD DES and residual LAD stenosis 04/11/2020    Past Surgical History:  Procedure Laterality Date  . CORONARY STENT INTERVENTION N/A 04/10/2020   Procedure: CORONARY STENT INTERVENTION;  Surgeon: Wellington Hampshire, MD;  Location: [place] [facility];  Service: Cardiovascular;  Laterality: N/A;  LAD   . INTRAVASCULAR ULTRASOUND/IVUS N/A 04/10/2020   Procedure: Intravascular Ultrasound/IVUS;  Surgeon: Wellington Hampshire, MD;  Location: [place] [facility];  Service: Cardiovascular;  Laterality: N/A;  LAD  . LEFT HEART CATH AND CORONARY ANGIOGRAPHY N/A 04/01/2020   Procedure: LEFT HEART CATH AND CORONARY ANGIOGRAPHY;  Surgeon: Wellington Hampshire, MD;  Location: [place] [facility];  Service: Cardiovascular;  Laterality: N/A;    Current Medications: Current Meds  Medication Sig  . acetaminophen (TYLENOL) 325 MG tablet Take 2 tablets (650 mg total) by mouth every 4 (four) hours as needed for headache or mild pain.  . Adalimumab 40 MG/0.8ML PNKT Inject 0.4 mLs into the skin every 14 (fourteen) days.   [name] amLODipine-valsartan (EXFORGE) 10-320 MG tablet Take 1 tablet by mouth daily.  [name] ascorbic acid (VITAMIN C) 1000 MG tablet Take 1,000 mg by mouth daily.   [name] aspirin 81 MG tablet Take 81 mg by mouth daily.  . cetirizine (ZYRTEC) 5 MG tablet Take 5 mg by mouth daily.  . Cholecalciferol 25 MCG (1000 UT) tablet Take 1,000 Units by mouth daily.   . clobetasol cream (TEMOVATE) 5.40 % Apply 1 application topically 2 (two) times daily as needed (Psoriasis).   . clopidogrel (PLAVIX) 75 MG tablet Take 1 tablet (75 mg total) by mouth daily.  [name] exenatide (BYETTA 10 MCG PEN) 10 MCG/0.04ML SOPN injection Inject into the skin 2 (two) times daily with a meal. 2.4 ml  . FARXIGA 5 MG TABS tablet Take 5 mg by mouth daily.  . Ginkgo Biloba 60 MG CAPS Take 60 mg by mouth in the morning and at bedtime.   [name]  glimepiride (AMARYL) 4 MG tablet Take 4 mg by mouth daily with breakfast.  . KRILL OIL PO Take 325 mg by mouth daily.  . metFORMIN (GLUCOPHAGE) 1000 MG tablet Take 1 tablet (1,000 mg total) by mouth 2 (two) times daily with a meal.  . metoprolol succinate (TOPROL-XL) 25 MG 24 hr tablet Take 12.5 mg (0.5 tablet) by mouth once daily  . nitroGLYCERIN (NITROSTAT) 0.4 MG SL tablet Place 1 tablet (0.4 mg total) under the tongue every 5 (five) minutes as needed for chest pain.  . rosuvastatin (CRESTOR) 40 MG tablet Take 1 tablet (40 mg total) by mouth at  bedtime.  . triamcinolone (NASACORT) 55 MCG/ACT AERO nasal inhaler Place 1 spray into the nose daily as needed (Congestion).   [name] VITAMIN D PO Take by mouth daily.  . vitamin E 180 MG (400 UNITS) capsule Take 400 Units by mouth daily.   . Zinc 30 MG TABS Take 30 mg by mouth daily.      Allergies:   Atorvastatin   Social History   Socioeconomic History  . Marital status: Married    Spouse name: Not on file  . Number of children: Not on file  . Years of education: Not on file  . Highest education level: Not on file  Occupational History  . Not on file  Tobacco Use  . Smoking status: Former Smoker    Years: 30.00    Quit date: 2009    Years since quitting: 13.2  . Smokeless tobacco: Never Used  Vaping Use  . Vaping Use: Never used  Substance and Sexual Activity  . Alcohol use: Yes    Comment: occasional alcohol intake  . Drug use: Never  . Sexual activity: Not on file  Other Topics Concern  . Not on file  Social History Narrative  . Not on file   Social Determinants of Health   Financial Resource Strain: Not on file  Food Insecurity: Not on file  Transportation Needs: Not on file  Physical Activity: Not on file  Stress: Not on file  Social Connections: Not on file     Family History: Father heart and MI at age 64.  ROS:   Please see the history of present illness.     All other systems reviewed and are negative.  EKGs/Labs/Other Studies Reviewed:    The following studies were reviewed today:   EKG:  EKG is  ordered today.  The ekg ordered today demonstrates normal sinus rhythm  Recent Labs: 04/11/2020: Hemoglobin 10.2; Platelets 251 05/24/2020: ALT 35; BUN 20; Creatinine, Ser 1.20; Potassium 4.0; Sodium 139  Recent Lipid Panel    Component Value Date/Time   CHOL 92 05/24/2020 1116   CHOL 169 03/28/2020 1025   TRIG 59 05/24/2020 1116   HDL 36 (L) 05/24/2020 1116   HDL 39 (L) 03/28/2020 1025   CHOLHDL 2.6 05/24/2020 1116   VLDL 12 05/24/2020 1116    LDLCALC 44 05/24/2020 1116   LDLCALC 96 03/28/2020 1025    Physical Exam:    VS:  BP (!) 104/52 (BP Location: Left Arm, Patient Position: Sitting, Cuff Size: Normal)   Pulse 80   Ht 5\' 8"  (1.727 m)   Wt 168 lb (76.2 kg)   SpO2 97%   BMI 25.54 kg/m     Wt Readings from Last 3 Encounters:  09/27/20 168 lb (76.2 kg)  05/31/20 173 lb 4 oz (78.6 kg)  04/19/20 168 lb (76.2 kg)     GEN:  Well  nourished, well developed in no acute distress HEENT: Normal NECK: No JVD; No carotid bruits LYMPHATICS: No lymphadenopathy CARDIAC: RRR, no murmurs, rubs, gallops RESPIRATORY:  Clear to auscultation without rales, wheezing or rhonchi  ABDOMEN: Soft, non-tender, non-distended MUSCULOSKELETAL:  No edema; No deformity  SKIN: Warm and dry NEUROLOGIC:  Alert and oriented x 3 PSYCHIATRIC:  Normal affect   ASSESSMENT:    1. Coronary artery disease of native artery of native heart with stable angina pectoris ([place])   2. Essential hypertension   3. Hyperlipidemia LDL goal <70    PLAN:    In order of problems listed above:  1. CAD/PCI x2 to LAD.  Denies chest pain or shortness of breath.  Last echo with preserved EF.  Continue aspirin, Plavix, Crestor. 2. Hypertension, BP controlled.  Toprol-XL, Exforge 3. Hyperlipidemia, LDL at goal.  High intensity statin Crestor.  Follow-up in 6 months.    Medication Adjustments/Labs and Tests Ordered: Current medicines are reviewed at length with the patient today.  Concerns regarding medicines are outlined above.  Orders Placed This Encounter  Procedures  . EKG 12-Lead   No orders of the defined types were placed in this encounter.   Patient Instructions  Medication Instructions:  Your physician recommends that you continue on your current medications as directed. Please refer to the Current Medication list given to you today.  *If you need a refill on your cardiac medications before your next appointment, please call your pharmacy*   Lab  Work: None ordered If you have labs (blood work) drawn today and your tests are completely normal, you will receive your results only by: [name] MyChart Message (if you have MyChart) OR . A paper copy in the mail If you have any lab test that is abnormal or we need to change your treatment, we will call you to review the results.   Testing/Procedures: None ordered   Follow-Up: At [facility], you and your health needs are our priority.  As part of our continuing mission to provide you with exceptional heart care, we have created designated Provider Care Teams.  These Care Teams include your primary Cardiologist (physician) and Advanced Practice Providers (APPs -  Physician Assistants and Nurse Practitioners) who all work together to provide you with the care you need, when you need it.  We recommend signing up for the patient portal called "MyChart".  Sign up information is provided on this After Visit Summary.  MyChart is used to connect with patients for Virtual Visits (Telemedicine).  Patients are able to view lab/test results, encounter notes, upcoming appointments, etc.  Non-urgent messages can be sent to your provider as well.   To learn more about what you can do with MyChart, go to [email or web address].    Your next appointment:   6 month(s)  The format for your next appointment:   In Person  Provider:   Kate Sable, MD   Other Instructions      Signed, Kate Sable, MD  09/27/2020 11:20 AM    [place]

## 2020-11-22 ENCOUNTER — Telehealth: Payer: Self-pay | Admitting: Cardiology

## 2020-11-22 NOTE — Telephone Encounter (Signed)
Patient c/o Palpitations:  High priority if patient c/o lightheadedness, shortness of breath, or chest pain  1) How long have you had palpitations/irregular HR/ Afib? Are you having the symptoms now? 2 weeks intermittent on exertion  2) Are you currently experiencing lightheadedness, SOB or CP? Light headed when bending over struggling to focus sob chest pain interim only on exertion   3) Do you have a history of afib (atrial fibrillation) or irregular heart rhythm? No   4) Have you checked your BP or HR? (document readings if available):      BP  HR   Now : 116/66   70-80   Baseline: 120/70  93  5) Are you experiencing any other symptoms? Fatigue continues

## 2020-11-22 NOTE — Telephone Encounter (Addendum)
Spoke with patient and he stated that his BP was low at 116/66. I assured him that this is okay. We would want him to notify us if it dropped below 100/60. Patient stated that he was working in the yard today and he felt a little light headed and SOB when bending over. He also felt a little CP upon on exertion that was alleviated as soon as he sat down.  I scheduled patient to come in for a visit with Dr. Garen Lah on 11/25/20. Patient also agreed to go to the ER if CP/SOB were to return or persist.

## 2020-11-25 ENCOUNTER — Encounter: Payer: Self-pay | Admitting: Cardiology

## 2020-11-25 ENCOUNTER — Other Ambulatory Visit: Payer: Self-pay

## 2020-11-25 ENCOUNTER — Ambulatory Visit: Payer: BC Managed Care – PPO | Admitting: Cardiology

## 2020-11-25 VITALS — BP 128/66 | HR 75 | Ht 67.0 in | Wt 166.0 lb

## 2020-11-25 DIAGNOSIS — I25118 Atherosclerotic heart disease of native coronary artery with other forms of angina pectoris: Secondary | ICD-10-CM | POA: Diagnosis not present

## 2020-11-25 DIAGNOSIS — I10 Essential (primary) hypertension: Secondary | ICD-10-CM

## 2020-11-25 DIAGNOSIS — R072 Precordial pain: Secondary | ICD-10-CM

## 2020-11-25 DIAGNOSIS — E785 Hyperlipidemia, unspecified: Secondary | ICD-10-CM | POA: Diagnosis not present

## 2020-11-25 MED ORDER — ISOSORBIDE MONONITRATE ER 30 MG PO TB24: 30.0000 mg | ORAL_TABLET | Freq: Every day | ORAL | 5 refills | Status: DC

## 2020-11-25 NOTE — Progress Notes (Signed)
Cardiology Office Note:    Date:  11/25/2020   ID:  Jesse Ray, DOB Mar 11, 1961, MRN [id number]  PCP:  Adin Hector, MD  [facility] Cardiologist:  Kate Sable, MD  [place] Electrophysiologist:  None   Referring MD: Adin Hector, MD   Chief Complaint  Patient presents with  . Other    Patient c.o chest pain and SOB. Meds reviewed verbally with patient.     History of Present Illness:    Jesse Ray is a 60 y.o. male with a hx of CAD s/p PCI x 2 to LAD 03/2020, diabetes, hypertension, hyperlipidemia who presents due to chest pain.  He describes chest pain ongoing over the past couple of months, worsening 3 days ago.  Chest pain comes about when he is walking, carrying objects.  Described discomfort as pressure-like, almost similar to when he had a stent placed.  Symptoms are not reproducible with palpation.  Symptoms are resolved with rest.  Recently started taking Farxiga, reduce dose to 5 mg with slight improvement in symptoms.   Prior notes Left heart cath 04/10/2020 status post PCI x2 to proximal and mid LAD.  Proximal RCA 85% small nondominant artery, diagonal to 60% Echocardiogram 9/62/2297 normal systolic and diastolic function, EF 98% ETT 06/28/2020 no evidence for inducible ischemia  Past Medical History:  Diagnosis Date  . Coronary artery disease   . Diabetes mellitus without complication ([place])   . HLD (hyperlipidemia) 04/11/2020  . Hyperlipidemia   . Hypertension   . Psoriatic arthritis ([place])   . S/P angioplasty with stent 04/10/30 of proximal and mLAD DES and residual LAD stenosis 04/11/2020    Past Surgical History:  Procedure Laterality Date  . CORONARY STENT INTERVENTION N/A 04/10/2020   Procedure: CORONARY STENT INTERVENTION;  Surgeon: Wellington Hampshire, MD;  Location: [place] [facility];  Service: Cardiovascular;  Laterality: N/A;  LAD   . INTRAVASCULAR ULTRASOUND/IVUS N/A 04/10/2020   Procedure: Intravascular Ultrasound/IVUS;   Surgeon: Wellington Hampshire, MD;  Location: [place] [facility];  Service: Cardiovascular;  Laterality: N/A;  LAD  . LEFT HEART CATH AND CORONARY ANGIOGRAPHY N/A 04/01/2020   Procedure: LEFT HEART CATH AND CORONARY ANGIOGRAPHY;  Surgeon: Wellington Hampshire, MD;  Location: [place] [facility];  Service: Cardiovascular;  Laterality: N/A;    Current Medications: Current Meds  Medication Sig  . acetaminophen (TYLENOL) 325 MG tablet Take 2 tablets (650 mg total) by mouth every 4 (four) hours as needed for headache or mild pain.  . Adalimumab 40 MG/0.8ML PNKT Inject 0.4 mLs into the skin every 14 (fourteen) days.   [name] amLODipine-valsartan (EXFORGE) 10-320 MG tablet Take 1 tablet by mouth daily.  [name] ascorbic acid (VITAMIN C) 1000 MG tablet Take 1,000 mg by mouth daily.   [name] aspirin 81 MG tablet Take 81 mg by mouth daily.  . cetirizine (ZYRTEC) 5 MG tablet Take 5 mg by mouth daily.  . Cholecalciferol 25 MCG (1000 UT) tablet Take 1,000 Units by mouth daily.   . clobetasol cream (TEMOVATE) 9.21 % Apply 1 application topically 2 (two) times daily as needed (Psoriasis).   . clopidogrel (PLAVIX) 75 MG tablet Take 1 tablet (75 mg total) by mouth daily.  [name] exenatide (BYETTA 10 MCG PEN) 10 MCG/0.04ML SOPN injection Inject into the skin 2 (two) times daily with a meal. 2.4 ml  . Ginkgo Biloba 60 MG CAPS Take 60 mg by mouth in the morning and at bedtime.   [name] glimepiride (AMARYL)  4 MG tablet Take 4 mg by mouth daily with breakfast.  . isosorbide mononitrate (IMDUR) 30 MG 24 hr tablet Take 1 tablet (30 mg total) by mouth daily.  [name] KRILL OIL PO Take 325 mg by mouth daily.  . metFORMIN (GLUCOPHAGE) 1000 MG tablet Take 1 tablet (1,000 mg total) by mouth 2 (two) times daily with a meal.  . metoprolol succinate (TOPROL-XL) 25 MG 24 hr tablet Take 12.5 mg (0.5 tablet) by mouth once daily  . nitroGLYCERIN (NITROSTAT) 0.4 MG SL tablet Place 1 tablet (0.4 mg total) under the tongue every 5 (five) minutes as needed for chest  pain.  . rosuvastatin (CRESTOR) 40 MG tablet Take 1 tablet (40 mg total) by mouth at bedtime.  . triamcinolone (NASACORT) 55 MCG/ACT AERO nasal inhaler Place 1 spray into the nose daily as needed (Congestion).   [name] VITAMIN D PO Take by mouth daily.  . vitamin E 180 MG (400 UNITS) capsule Take 400 Units by mouth daily.   . Zinc 30 MG TABS Take 30 mg by mouth daily.   . [DISCONTINUED] FARXIGA 5 MG TABS tablet Take 5 mg by mouth daily.     Allergies:   Atorvastatin   Social History   Socioeconomic History  . Marital status: Married    Spouse name: Not on file  . Number of children: Not on file  . Years of education: Not on file  . Highest education level: Not on file  Occupational History  . Not on file  Tobacco Use  . Smoking status: Former Smoker    Years: 30.00    Quit date: 2009    Years since quitting: 13.3  . Smokeless tobacco: Never Used  Vaping Use  . Vaping Use: Never used  Substance and Sexual Activity  . Alcohol use: Yes    Comment: occasional alcohol intake  . Drug use: Never  . Sexual activity: Not on file  Other Topics Concern  . Not on file  Social History Narrative  . Not on file   Social Determinants of Health   Financial Resource Strain: Not on file  Food Insecurity: Not on file  Transportation Needs: Not on file  Physical Activity: Not on file  Stress: Not on file  Social Connections: Not on file     Family History: Father heart and MI at age 43.  ROS:   Please see the history of present illness.     All other systems reviewed and are negative.  EKGs/Labs/Other Studies Reviewed:    The following studies were reviewed today:   EKG:  EKG is  ordered today.  The ekg ordered today demonstrates normal sinus rhythm, normal ECG  Recent Labs: 04/11/2020: Hemoglobin 10.2; Platelets 251 05/24/2020: ALT 35; BUN 20; Creatinine, Ser 1.20; Potassium 4.0; Sodium 139  Recent Lipid Panel    Component Value Date/Time   CHOL 92 05/24/2020 1116   CHOL  169 03/28/2020 1025   TRIG 59 05/24/2020 1116   HDL 36 (L) 05/24/2020 1116   HDL 39 (L) 03/28/2020 1025   CHOLHDL 2.6 05/24/2020 1116   VLDL 12 05/24/2020 1116   LDLCALC 44 05/24/2020 1116   LDLCALC 96 03/28/2020 1025    Physical Exam:    VS:  BP 128/66 (BP Location: Left Arm, Patient Position: Sitting, Cuff Size: Normal)   Pulse 75   Ht 5\' 7"  (1.702 m)   Wt 166 lb (75.3 kg)   SpO2 99%   BMI 26.00 kg/m     Wt  Readings from Last 3 Encounters:  11/25/20 166 lb (75.3 kg)  09/27/20 168 lb (76.2 kg)  05/31/20 173 lb 4 oz (78.6 kg)     GEN:  Well nourished, well developed in no acute distress HEENT: Normal NECK: No JVD; No carotid bruits LYMPHATICS: No lymphadenopathy CARDIAC: RRR, no murmurs, rubs, gallops RESPIRATORY:  Clear to auscultation without rales, wheezing or rhonchi  ABDOMEN: Soft, non-tender, non-distended MUSCULOSKELETAL:  No edema; No deformity  SKIN: Warm and dry NEUROLOGIC:  Alert and oriented x 3 PSYCHIATRIC:  Normal affect   ASSESSMENT:    1. Coronary artery disease of native artery of native heart with stable angina pectoris ([place])   2. Primary hypertension   3. Hyperlipidemia LDL goal <70   4. Precordial pain    PLAN:    In order of problems listed above:  1. CAD/PCI x2 to LAD 03/2021, RCA 85%, not intervened on, small nondominant RCA.  Endorses chest pain with exertion.  Last echo with preserved EF.  Continue aspirin, Plavix, Crestor.  Get [employer].  Start Imdur 30 mg daily.  Okay to hold Wilder Glade to see if symptoms improve. 2. Hypertension, BP controlled.  Toprol-XL, Exforge 3. Hyperlipidemia, LDL at goal.  Continue Crestor  Follow-up after [facility].  Shared Decision Making/Informed Consent The risks [chest pain, shortness of breath, cardiac arrhythmias, dizziness, blood pressure fluctuations, myocardial infarction, stroke/transient ischemic attack, nausea, vomiting, allergic reaction, radiation exposure, metallic taste sensation  and life-threatening complications (estimated to be 1 in 10,000)], benefits (risk stratification, diagnosing coronary artery disease, treatment guidance) and alternatives of a nuclear stress test were discussed in detail with Mr. Dolinger and he agrees to proceed.    Medication Adjustments/Labs and Tests Ordered: Current medicines are reviewed at length with the patient today.  Concerns regarding medicines are outlined above.  Orders Placed This Encounter  Procedures  . NM Myocar Multi W/Spect W/Wall Motion / EF  . EKG 12-Lead   Meds ordered this encounter  Medications  . isosorbide mononitrate (IMDUR) 30 MG 24 hr tablet    Sig: Take 1 tablet (30 mg total) by mouth daily.    Dispense:  30 tablet    Refill:  5    Patient Instructions  Medication Instructions:   Your physician has recommended you make the following change in your medication:   1.  Hold your Wilder Glade until testing is complete and Dr. Garen Lah reviews.  2.  START Imdur (Isosorbide Mononitrate) 30 MG once daily.   *If you need a refill on your cardiac medications before your next appointment, please call your pharmacy*   Lab Work: None ordered If you have labs (blood work) drawn today and your tests are completely normal, you will receive your results only by: [name] MyChart Message (if you have MyChart) OR . A paper copy in the mail If you have any lab test that is abnormal or we need to change your treatment, we will call you to review the results.   Testing/Procedures:  1.  [place]       Your caregiver has ordered a Stress Test with nuclear imaging. The purpose of this test is to evaluate the blood supply to your heart muscle. This procedure is referred to as a "Non-Invasive Stress Test." This is because other than having an IV started in your vein, nothing is inserted or "invades" your body. Cardiac stress tests are done to find areas of poor blood flow to the heart by determining the extent of coronary  artery  disease (CAD). Some patients exercise on a treadmill, which naturally increases the blood flow to your heart, while others who are  unable to walk on a treadmill due to physical limitations have a pharmacologic/chemical stress agent called Lexiscan . This medicine will mimic walking on a treadmill by temporarily increasing your coronary blood flow.      PLEASE REPORT TO [facility] MEDICAL MALL ENTRANCE   THE VOLUNTEERS AT THE FIRST DESK WILL DIRECT YOU WHERE TO GO     *Please note: these test may take anywhere between 2-4 hours to complete       Date of Procedure:_____________________________________   Arrival Time for Procedure:______________________________    PLEASE NOTIFY THE OFFICE AT LEAST 24 HOURS IN ADVANCE IF YOU ARE UNABLE TO KEEP YOUR APPOINTMENT.  [place] 24 HOURS IN ADVANCE IF YOU ARE UNABLE TO KEEP YOUR APPOINTMENT. [phone]         How to prepare for your Myoview test:         _XX___:  Hold diabetes medication the morning of procedure: glimepiride (AMARYL) , metFORMIN (GLUCOPHAGE),    1. Do not eat or drink after midnight  2. No caffeine for 24 hours prior to test  3. No smoking 24 hours prior to test.  4. Unless instructed otherwise, Take your medication with a small sips of water.         Follow-Up: At [facility], you and your health needs are our priority.  As part of our continuing mission to provide you with exceptional heart care, we have created designated Provider Care Teams.  These Care Teams include your primary Cardiologist (physician) and Advanced Practice Providers (APPs -  Physician Assistants and Nurse Practitioners) who all work together to provide you with the care you need, when you need it.  We recommend signing up for the patient portal called "MyChart".  Sign up information is provided on this After Visit Summary.  MyChart is used to connect with patients for Virtual Visits  (Telemedicine).  Patients are able to view lab/test results, encounter notes, upcoming appointments, etc.  Non-urgent messages can be sent to your provider as well.   To learn more about what you can do with MyChart, go to [email or web address].    Your next appointment:   Follow up after Myoview   The format for your next appointment:   In Person  Provider:   Kate Sable, MD   Other Instructions      Signed, Kate Sable, MD  11/25/2020 11:01 AM    [place]

## 2020-11-25 NOTE — Patient Instructions (Signed)
Medication Instructions:   Your physician has recommended you make the following change in your medication:   1.  Hold your Wilder Glade until testing is complete and Dr. Garen Lah reviews.  2.  START Imdur (Isosorbide Mononitrate) 30 MG once daily.   *If you need a refill on your cardiac medications before your next appointment, please call your pharmacy*   Lab Work: None ordered If you have labs (blood work) drawn today and your tests are completely normal, you will receive your results only by: [name] MyChart Message (if you have MyChart) OR . A paper copy in the mail If you have any lab test that is abnormal or we need to change your treatment, we will call you to review the results.   Testing/Procedures:  1.  [place]       Your caregiver has ordered a Stress Test with nuclear imaging. The purpose of this test is to evaluate the blood supply to your heart muscle. This procedure is referred to as a "Non-Invasive Stress Test." This is because other than having an IV started in your vein, nothing is inserted or "invades" your body. Cardiac stress tests are done to find areas of poor blood flow to the heart by determining the extent of coronary artery disease (CAD). Some patients exercise on a treadmill, which naturally increases the blood flow to your heart, while others who are  unable to walk on a treadmill due to physical limitations have a pharmacologic/chemical stress agent called Lexiscan . This medicine will mimic walking on a treadmill by temporarily increasing your coronary blood flow.      PLEASE REPORT TO [facility] MEDICAL MALL ENTRANCE   THE VOLUNTEERS AT THE FIRST DESK WILL DIRECT YOU WHERE TO GO     *Please note: these test may take anywhere between 2-4 hours to complete       Date of Procedure:_____________________________________   Arrival Time for Procedure:______________________________    PLEASE NOTIFY THE OFFICE AT LEAST 24 HOURS IN ADVANCE IF YOU ARE UNABLE TO  KEEP YOUR APPOINTMENT.  [place] 24 HOURS IN ADVANCE IF YOU ARE UNABLE TO KEEP YOUR APPOINTMENT. [phone]         How to prepare for your Myoview test:         _XX___:  Hold diabetes medication the morning of procedure: glimepiride (AMARYL) , metFORMIN (GLUCOPHAGE),    1. Do not eat or drink after midnight  2. No caffeine for 24 hours prior to test  3. No smoking 24 hours prior to test.  4. Unless instructed otherwise, Take your medication with a small sips of water.         Follow-Up: At [facility], you and your health needs are our priority.  As part of our continuing mission to provide you with exceptional heart care, we have created designated Provider Care Teams.  These Care Teams include your primary Cardiologist (physician) and Advanced Practice Providers (APPs -  Physician Assistants and Nurse Practitioners) who all work together to provide you with the care you need, when you need it.  We recommend signing up for the patient portal called "MyChart".  Sign up information is provided on this After Visit Summary.  MyChart is used to connect with patients for Virtual Visits (Telemedicine).  Patients are able to view lab/test results, encounter notes, upcoming appointments, etc.  Non-urgent messages can be sent to your provider as well.   To learn more about what you  can do with MyChart, go to [email or web address].    Your next appointment:   Follow up after Myoview   The format for your next appointment:   In Person  Provider:   Kate Sable, MD   Other Instructions

## 2020-11-29 ENCOUNTER — Other Ambulatory Visit: Payer: Self-pay

## 2020-11-29 MED ORDER — METOPROLOL SUCCINATE ER 25 MG PO TB24: ORAL_TABLET | ORAL | 0 refills | Status: DC

## 2020-12-06 ENCOUNTER — Other Ambulatory Visit: Payer: Self-pay

## 2020-12-06 ENCOUNTER — Ambulatory Visit
Admission: RE | Admit: 2020-12-06 | Discharge: 2020-12-06 | Disposition: A | Discharge status: Home / Self Care | Payer: BC Managed Care – PPO | Source: Ambulatory Visit | Attending: Cardiology | Admitting: Cardiology

## 2020-12-06 DIAGNOSIS — R072 Precordial pain: Secondary | ICD-10-CM

## 2020-12-06 MED ORDER — TECHNETIUM TC 99M TETROFOSMIN IV KIT
30.0000 | PACK | Freq: Once | INTRAVENOUS | Status: AC | PRN
  Administered 2020-12-06: 32.24 via INTRAVENOUS

## 2020-12-06 MED ORDER — REGADENOSON 0.4 MG/5ML IV SOLN
0.4000 mg | Freq: Once | INTRAVENOUS | Status: AC
  Administered 2020-12-06: 0.4 mg via INTRAVENOUS
  Filled 2020-12-06: qty 5

## 2020-12-06 MED ORDER — TECHNETIUM TC 99M TETROFOSMIN IV KIT
10.0000 | PACK | Freq: Once | INTRAVENOUS | Status: AC | PRN
  Administered 2020-12-06: 9.56 via INTRAVENOUS

## 2020-12-08 LAB — NM MYOCAR MULTI W/SPECT W/WALL MOTION / EF
Estimated workload: 1 METS
Exercise duration (min): 0 min
Exercise duration (sec): 0 s
LV dias vol: 73 mL (ref 62–150)
LV sys vol: 25 mL
MPHR: 162 {beats}/min
Peak HR: 105 {beats}/min
Percent HR: 64 %
Rest HR: 78 {beats}/min
SDS: 0
SRS: 1
SSS: 0
TID: 1.19

## 2020-12-10 ENCOUNTER — Telehealth: Payer: Self-pay | Admitting: Cardiology

## 2020-12-10 NOTE — Telephone Encounter (Signed)
° ° °  Please call with stress test results °

## 2020-12-10 NOTE — Telephone Encounter (Signed)
The patient had his myoview done on Friday 12/06/20. Dr. Garen Lah is currently out of the office.   The test has been read by Dr. Rockey Situ as below: Narrative & Impression  Pharmacological myocardial perfusion imaging study with no significant  ischemia Normal wall motion, EF estimated at 74% No EKG changes concerning for ischemia at peak stress or in recovery. CT attenuation correction images with significant coronary calcification most notably in the LAD Low risk scan   Signed, Esmond Plants, MD, Ph.D [facility]   The patient is currently on statin therapy- Crestor 40 mg once daily.   Will forward to Dr. Rockey Situ to advise in the interim if any changes would be recommended based on myoview findings

## 2020-12-11 ENCOUNTER — Telehealth: Payer: Self-pay | Admitting: Cardiology

## 2020-12-11 ENCOUNTER — Encounter: Payer: Self-pay | Admitting: Family

## 2020-12-11 NOTE — Telephone Encounter (Signed)
I called and spoke with the patient's wife (ok per DPR) regarding his stress test results as read by Dr. Rockey Situ and as stated below.   Mrs. Shonka voices understanding of these results and is agreeable.  She did state Dr. Garen Lah had the patient holding his Wilder Glade until after his stress test. They wanted to know if he still needed to do this.   I did confirm this is what the patient was advised to do as per Dr. Thereasa Solo 11/25/20 office note.  I have advised Mrs. Wittmeyer to have the patient to continue to hold his Wilder Glade until Dr. Garen Lah is back in the office and can further advise. She is aware it will be next week before they receive a call.  They are also advised to keep the follow up with Dr. Garen Lah as scheduled on 12/26/20.  Mrs. Hernan is agreeable.

## 2020-12-11 NOTE — Telephone Encounter (Signed)
Patient spouse calling  Concerned about results they saw in mychart and would like a nurse to call and clarify some things Please call to discuss

## 2020-12-11 NOTE — Telephone Encounter (Signed)
[place] patient Stress test showing no ischemia The stress test does pick up the coronary disease we know that he has, had 2 stents placed to his LAD Stents appear open by the stress test, coronary calcification is not a surprise it was seen on prior catheterization and fixed by prior stenting No medication changes needed, goal LDL less than 70 Appears he is on Crestor

## 2020-12-11 NOTE — Telephone Encounter (Signed)
Called the patient after receiving a mychart message requesting a call back to discuss results.  DPR on file. Spoke with the patients wife. Adv her that the patients stress test is awaiting review and signature or Dr. Garen Lah.  Gave the patients wife prelim results. Pharmacological myocardial perfusion imaging study with no significant  ischemia Normal wall motion, EF estimated at 74% No EKG changes concerning for ischemia at peak stress or in recovery. CT attenuation correction images with significant coronary calcification most notably in the LAD Low risk scan  Adv the patients wife that once Dr. Garen Lah reviews and give his recommendation the patient will be contacted with the finalized results.  Patients wife verbalized understanding and voiced appreciation for the call.

## 2020-12-12 NOTE — Telephone Encounter (Signed)
Noted  

## 2020-12-17 ENCOUNTER — Other Ambulatory Visit: Payer: Self-pay

## 2020-12-17 MED ORDER — CLOPIDOGREL BISULFATE 75 MG PO TABS: 75.0000 mg | ORAL_TABLET | Freq: Every day | ORAL | 0 refills | Status: DC

## 2020-12-26 ENCOUNTER — Ambulatory Visit: Payer: BC Managed Care – PPO | Admitting: Cardiology

## 2020-12-26 ENCOUNTER — Other Ambulatory Visit: Payer: Self-pay

## 2020-12-26 ENCOUNTER — Encounter: Payer: Self-pay | Admitting: Cardiology

## 2020-12-26 VITALS — BP 124/60 | HR 80 | Ht 67.0 in | Wt 169.0 lb

## 2020-12-26 DIAGNOSIS — E785 Hyperlipidemia, unspecified: Secondary | ICD-10-CM | POA: Diagnosis not present

## 2020-12-26 DIAGNOSIS — I10 Essential (primary) hypertension: Secondary | ICD-10-CM | POA: Diagnosis not present

## 2020-12-26 DIAGNOSIS — R0600 Dyspnea, unspecified: Secondary | ICD-10-CM

## 2020-12-26 DIAGNOSIS — I2510 Atherosclerotic heart disease of native coronary artery without angina pectoris: Secondary | ICD-10-CM

## 2020-12-26 DIAGNOSIS — R0609 Other forms of dyspnea: Secondary | ICD-10-CM

## 2020-12-26 MED ORDER — ISOSORBIDE MONONITRATE ER 30 MG PO TB24: 15.0000 mg | ORAL_TABLET | Freq: Every day | ORAL | 5 refills | Status: DC

## 2020-12-26 NOTE — Progress Notes (Signed)
Cardiology Office Note:    Date:  12/26/2020   ID:  Jesse Ray, DOB [birth year], MRN [id number]  PCP:  Adin Hector, MD  [facility] Cardiologist:  Kate Sable, MD  [place] Electrophysiologist:  None   Referring MD: Adin Hector, MD   Chief Complaint  Patient presents with   Other    Follow up post Myoview. Meds reviewed verbally with patient.     History of Present Illness:    Jesse Ray is a 60 y.o. male with a hx of CAD s/p PCI x 2 to LAD 03/2020, diabetes, hypertension, hyperlipidemia, prior smoker x20 years who presents for follow-up.  Previously seen due to chest pain.  Lexiscan Myoview was obtained to evaluate presence of ischemia.  Started on Imdur for antianginal benefit.  Also endorsed starting [place] which caused some chest discomfort.    He endorsed having shortness of breath with exertion, his chest pain typically occurs when he performs activities such as washing the windows of his truck.  He is a Administrator.  Denies chest pain with ambulation.  Endorses daytime somnolence, fatigue even though he sleeps for about 8 hours every night.  Complains of headaches with starting Imdur.   Prior notes Left heart cath 04/10/2020 status post PCI x2 to proximal and mid LAD.  Proximal RCA 85% small nondominant artery, diagonal to 60% Echocardiogram 09/25/1759 normal systolic and diastolic function, EF 60% ETT 06/28/2020 no evidence for inducible ischemia  Past Medical History:  Diagnosis Date   Coronary artery disease    Diabetes mellitus without complication ([place])    HLD (hyperlipidemia) 04/11/2020   Hyperlipidemia    Hypertension    Psoriatic arthritis ([place])    S/P angioplasty with stent 04/10/30 of proximal and mLAD DES and residual LAD stenosis 04/11/2020    Past Surgical History:  Procedure Laterality Date   CORONARY STENT INTERVENTION N/A 04/10/2020   Procedure: CORONARY STENT INTERVENTION;  Surgeon: Wellington Hampshire, MD;  Location: [facility];  Service: Cardiovascular;  Laterality: N/A;  LAD    INTRAVASCULAR ULTRASOUND/IVUS N/A 04/10/2020   Procedure: Intravascular Ultrasound/IVUS;  Surgeon: Wellington Hampshire, MD;  Location: [facility];  Service: Cardiovascular;  Laterality: N/A;  LAD   LEFT HEART CATH AND CORONARY ANGIOGRAPHY N/A 04/01/2020   Procedure: LEFT HEART CATH AND CORONARY ANGIOGRAPHY;  Surgeon: Wellington Hampshire, MD;  Location: [facility];  Service: Cardiovascular;  Laterality: N/A;    Current Medications: Current Meds  Medication Sig   acetaminophen (TYLENOL) 325 MG tablet Take 2 tablets (650 mg total) by mouth every 4 (four) hours as needed for headache or mild pain.   Adalimumab 40 MG/0.8ML PNKT Inject 0.4 mLs into the skin every 14 (fourteen) days.    amLODipine-valsartan (EXFORGE) 10-320 MG tablet Take 1 tablet by mouth daily.   ascorbic acid (VITAMIN C) 1000 MG tablet Take 1,000 mg by mouth daily.    aspirin 81 MG tablet Take 81 mg by mouth daily.   cetirizine (ZYRTEC) 5 MG tablet Take 5 mg by mouth daily.   Cholecalciferol 25 MCG (1000 UT) tablet Take 1,000 Units by mouth daily.    clobetasol cream (TEMOVATE) 7.37 % Apply 1 application topically 2 (two) times daily as needed (Psoriasis).    clopidogrel (PLAVIX) 75 MG tablet Take 1 tablet (75 mg total) by mouth daily.   exenatide (BYETTA 10 MCG PEN) 10 MCG/0.04ML SOPN injection Inject into the skin 2 (two) times daily with  a meal. 2.4 ml   Ginkgo Biloba 60 MG CAPS Take 60 mg by mouth in the morning and at bedtime.    glimepiride (AMARYL) 4 MG tablet Take 4 mg by mouth daily with breakfast.   KRILL OIL PO Take 325 mg by mouth daily.   metFORMIN (GLUCOPHAGE) 1000 MG tablet Take 1 tablet (1,000 mg total) by mouth 2 (two) times daily with a meal.   metoprolol succinate (TOPROL-XL) 25 MG 24 hr tablet Take 12.5 mg (0.5 tablet) by mouth once daily   nitroGLYCERIN (NITROSTAT) 0.4 MG SL tablet Place 1 tablet (0.4 mg total) under the tongue  every 5 (five) minutes as needed for chest pain.   rosuvastatin (CRESTOR) 40 MG tablet Take 1 tablet (40 mg total) by mouth at bedtime.   triamcinolone (NASACORT) 55 MCG/ACT AERO nasal inhaler Place 1 spray into the nose daily as needed (Congestion).    VITAMIN D PO Take by mouth daily.   vitamin E 180 MG (400 UNITS) capsule Take 400 Units by mouth daily.    Zinc 30 MG TABS Take 30 mg by mouth daily.    [DISCONTINUED] isosorbide mononitrate (IMDUR) 30 MG 24 hr tablet Take 1 tablet (30 mg total) by mouth daily.     Allergies:   Atorvastatin   Social History   Socioeconomic History   Marital status: Married    Spouse name: Not on file   Number of children: Not on file   Years of education: Not on file   Highest education level: Not on file  Occupational History   Not on file  Tobacco Use   Smoking status: Former    Years: 30.00    Pack years: 0.00    Types: Cigarettes    Quit date: 2009    Years since quitting: 13.4   Smokeless tobacco: Never  Vaping Use   Vaping Use: Never used  Substance and Sexual Activity   Alcohol use: Yes    Comment: occasional alcohol intake   Drug use: Never   Sexual activity: Not on file  Other Topics Concern   Not on file  Social History Narrative   Not on file   Social Determinants of Health   Financial Resource Strain: Not on file  Food Insecurity: Not on file  Transportation Needs: Not on file  Physical Activity: Not on file  Stress: Not on file  Social Connections: Not on file     Family History: Father heart and MI at age 5.  ROS:   Please see the history of present illness.     All other systems reviewed and are negative.  EKGs/Labs/Other Studies Reviewed:    The following studies were reviewed today:   EKG:  EKG is  ordered today.  The ekg ordered today demonstrates normal sinus rhythm, normal ECG  Recent Labs: 04/11/2020: Hemoglobin 10.2; Platelets 251 05/24/2020: ALT 35; BUN 20; Creatinine, Ser 1.20; Potassium 4.0;  Sodium 139  Recent Lipid Panel    Component Value Date/Time   CHOL 92 05/24/2020 1116   CHOL 169 03/28/2020 1025   TRIG 59 05/24/2020 1116   HDL 36 (L) 05/24/2020 1116   HDL 39 (L) 03/28/2020 1025   CHOLHDL 2.6 05/24/2020 1116   VLDL 12 05/24/2020 1116   LDLCALC 44 05/24/2020 1116   LDLCALC 96 03/28/2020 1025    Physical Exam:    VS:  BP 124/60 (BP Location: Left Arm, Patient Position: Sitting, Cuff Size: Normal)   Pulse 80   Ht 5\' 7"  (  1.702 m)   Wt 169 lb (76.7 kg)   SpO2 98%   BMI 26.47 kg/m     Wt Readings from Last 3 Encounters:  12/26/20 169 lb (76.7 kg)  11/25/20 166 lb (75.3 kg)  09/27/20 168 lb (76.2 kg)     GEN:  Well nourished, well developed in no acute distress HEENT: Normal NECK: No JVD; No carotid bruits LYMPHATICS: No lymphadenopathy CARDIAC: RRR, no murmurs, rubs, gallops RESPIRATORY:  Clear to auscultation without rales, wheezing or rhonchi  ABDOMEN: Soft, non-tender, non-distended MUSCULOSKELETAL:  No edema; No deformity  SKIN: Warm and dry NEUROLOGIC:  Alert and oriented x 3 PSYCHIATRIC:  Normal affect   ASSESSMENT:    1. Coronary artery disease involving native coronary artery of native heart, unspecified whether angina present   2. Primary hypertension   3. Hyperlipidemia LDL goal <70   4. Dyspnea on exertion     PLAN:    In order of problems listed above:  CAD/PCI x2 to LAD 03/2021, RCA 85%, not intervened on, small nondominant RCA.  [name]  Last echo with preserved EF.  Lexiscan Myoview 11/2020 with no evidence for ischemia.  Continue aspirin, Plavix, Crestor.  Reduce Imdur to 15 mg daily due to headaches.  Restart Farxiga for diabetes as prescribed per PCP.  This is the likely cause for chest discomfort.  Hypertension, BP controlled.  Toprol-XL, Exforge Hyperlipidemia, LDL at goal.  Continue Crestor Shortness of breath, daytime somnolence, fatigue, prior smoking history, chest CT 03/2020 with possible small airway disease.  Will refer to  pulmonary medicine for sleep apnea eval, PFTs due to history of smoking.  Follow-up in 3 months   Medication Adjustments/Labs and Tests Ordered: Current medicines are reviewed at length with the patient today.  Concerns regarding medicines are outlined above.  Orders Placed This Encounter  Procedures   Ambulatory referral to Pulmonology    Meds ordered this encounter  Medications   isosorbide mononitrate (IMDUR) 30 MG 24 hr tablet    Sig: Take 0.5 tablets (15 mg total) by mouth daily.    Dispense:  30 tablet    Refill:  5     Patient Instructions  Medication Instructions:   Your physician has recommended you make the following change in your medication:    DECREASE your Imdur to 15 MG once a day.  Okay to restart your [place] as recommended by your Primary Care Provider.   *If you need a refill on your cardiac medications before your next appointment, please call your pharmacy*   Lab Work: None Ordered If you have labs (blood work) drawn today and your tests are completely normal, you will receive your results only by: [place] (if you have MyChart) OR A paper copy in the mail If you have any lab test that is abnormal or we need to change your treatment, we will call you to review the results.   Testing/Procedures: None Ordered   Follow-Up: At [facility], you and your health needs are our priority.  As part of our continuing mission to provide you with exceptional heart care, we have created designated Provider Care Teams.  These Care Teams include your primary Cardiologist (physician) and Advanced Practice Providers (APPs -  Physician Assistants and Nurse Practitioners) who all work together to provide you with the care you need, when you need it.  We recommend signing up for the patient portal called "MyChart".  Sign up information is provided on this After Visit Summary.  MyChart is used  to connect with patients for Virtual Visits (Telemedicine).   Patients are able to view lab/test results, encounter notes, upcoming appointments, etc.  Non-urgent messages can be sent to your provider as well.   To learn more about what you can do with MyChart, go to [email or web address].    Your next appointment:   3 month(s)  The format for your next appointment:   In Person  Provider:   Kate Sable, MD   Signed, Kate Sable, MD  12/26/2020 12:20 PM    [place]

## 2020-12-26 NOTE — Patient Instructions (Signed)
Medication Instructions:   Your physician has recommended you make the following change in your medication:    DECREASE your Imdur to 15 MG once a day.  Okay to restart your [place] as recommended by your Primary Care Provider.   *If you need a refill on your cardiac medications before your next appointment, please call your pharmacy*   Lab Work: None Ordered If you have labs (blood work) drawn today and your tests are completely normal, you will receive your results only by: [place] (if you have MyChart) OR A paper copy in the mail If you have any lab test that is abnormal or we need to change your treatment, we will call you to review the results.   Testing/Procedures: None Ordered   Follow-Up: At [facility], you and your health needs are our priority.  As part of our continuing mission to provide you with exceptional heart care, we have created designated Provider Care Teams.  These Care Teams include your primary Cardiologist (physician) and Advanced Practice Providers (APPs -  Physician Assistants and Nurse Practitioners) who all work together to provide you with the care you need, when you need it.  We recommend signing up for the patient portal called "MyChart".  Sign up information is provided on this After Visit Summary.  MyChart is used to connect with patients for Virtual Visits (Telemedicine).  Patients are able to view lab/test results, encounter notes, upcoming appointments, etc.  Non-urgent messages can be sent to your provider as well.   To learn more about what you can do with MyChart, go to [email or web address].    Your next appointment:   3 month(s)  The format for your next appointment:   In Person  Provider:   Kate Sable, MD

## 2020-12-31 NOTE — Telephone Encounter (Signed)
Patient was seen in office on 12/26/20 and this was discussed with Dr. Garen Lah. Closing Encounter.

## 2021-01-06 NOTE — Telephone Encounter (Signed)
Wife called back and was given some options of when to call Pulmonary for sooner appt. Wife understood

## 2021-01-14 ENCOUNTER — Telehealth: Payer: Self-pay | Admitting: Cardiology

## 2021-01-14 NOTE — Telephone Encounter (Signed)
Pt c/o Shortness Of Breath: STAT if SOB developed within the last 24 hours or pt is noticeably SOB on the phone  1. Are you currently SOB (can you hear that pt is SOB on the phone)? While walking  2. How long have you been experiencing SOB? Since October  3. Are you SOB when sitting or when up moving around? Moving around  4. Are you currently experiencing any other symptoms? Chest feels tight PCP told patient to go to ER but patient trying to avoid ER visit

## 2021-01-15 NOTE — Telephone Encounter (Signed)
Meriam Sprague called and spoke to patients wife and offered them an appointment with Dr. Garen Lah this Friday. She accepted the appointment.

## 2021-01-17 ENCOUNTER — Encounter: Payer: Self-pay | Admitting: Cardiology

## 2021-01-17 ENCOUNTER — Ambulatory Visit: Payer: BC Managed Care – PPO | Admitting: Cardiology

## 2021-01-17 ENCOUNTER — Other Ambulatory Visit: Payer: Self-pay

## 2021-01-17 VITALS — BP 112/64 | HR 81 | Ht 67.0 in | Wt 167.0 lb

## 2021-01-17 DIAGNOSIS — I2510 Atherosclerotic heart disease of native coronary artery without angina pectoris: Secondary | ICD-10-CM

## 2021-01-17 DIAGNOSIS — I10 Essential (primary) hypertension: Secondary | ICD-10-CM | POA: Diagnosis not present

## 2021-01-17 DIAGNOSIS — R072 Precordial pain: Secondary | ICD-10-CM | POA: Diagnosis not present

## 2021-01-17 DIAGNOSIS — R0600 Dyspnea, unspecified: Secondary | ICD-10-CM

## 2021-01-17 DIAGNOSIS — E785 Hyperlipidemia, unspecified: Secondary | ICD-10-CM | POA: Diagnosis not present

## 2021-01-17 DIAGNOSIS — R0609 Other forms of dyspnea: Secondary | ICD-10-CM

## 2021-01-17 MED ORDER — METOPROLOL SUCCINATE ER 25 MG PO TB24: 25.0000 mg | ORAL_TABLET | Freq: Every day | ORAL | 3 refills | Status: DC

## 2021-01-17 MED ORDER — SODIUM CHLORIDE 0.9% FLUSH: 3.0000 mL | Freq: Two times a day (BID) | INTRAVENOUS | Status: AC

## 2021-01-17 NOTE — Addendum Note (Signed)
Addended by: Kate Sable on: 01/17/2021 01:01 PM   Modules accepted: Orders, SmartSet

## 2021-01-17 NOTE — Progress Notes (Signed)
Cardiology Office Note:    Date:  01/17/2021   ID:  Jesse Ray, DOB 1961-03-22, MRN [id number]  PCP:  Adin Hector, MD  [facility] Cardiologist:  Kate Sable, MD  [place] Electrophysiologist:  None   Referring MD: Adin Hector, MD   Chief Complaint  Patient presents with   Other    Patient c.o SOB and chest pressure on exertion. Meds reviewed verbally with patient.     History of Present Illness:    Jesse Ray is a 60 y.o. male with a hx of CAD s/p PCI x 2 to LAD 03/2020, diabetes, hypertension, hyperlipidemia, prior smoker x20 years who presents f due to chest pain and shortness of breath.  Patient states having worsening shortness of breath over the past several weeks associated with exertion.  When he keeps walking, he develops a central chest pain/burning sensation similar to when he had his stent.  Imdur was started at last visit, but he states this made the shortness of breath worse and also had headaches.  Small airway disease were noted about prior chest CT/calcium score.  He was referred to pulmonary medicine but still waiting for appointment.  Endorsed taking all his medications including aspirin, Plavix, Crestor as prescribed.  Stopping Wilder Glade helped a little bit with symptoms.   Prior notes Left heart cath 04/10/2020 status post PCI x2 to proximal and mid LAD.  Proximal RCA 85% small nondominant artery, diagonal to 60% Echocardiogram 2/99/2426 normal systolic and diastolic function, EF 83% ETT 06/28/2020 no evidence for inducible ischemia  Past Medical History:  Diagnosis Date   Coronary artery disease    Diabetes mellitus without complication ([place])    HLD (hyperlipidemia) 04/11/2020   Hyperlipidemia    Hypertension    Psoriatic arthritis ([place])    S/P angioplasty with stent 04/10/30 of proximal and mLAD DES and residual LAD stenosis 04/11/2020    Past Surgical History:  Procedure Laterality Date   CORONARY STENT INTERVENTION N/A  04/10/2020   Procedure: CORONARY STENT INTERVENTION;  Surgeon: Wellington Hampshire, MD;  Location: [place] [facility];  Service: Cardiovascular;  Laterality: N/A;  LAD    INTRAVASCULAR ULTRASOUND/IVUS N/A 04/10/2020   Procedure: Intravascular Ultrasound/IVUS;  Surgeon: Wellington Hampshire, MD;  Location: [place] [facility];  Service: Cardiovascular;  Laterality: N/A;  LAD   LEFT HEART CATH AND CORONARY ANGIOGRAPHY N/A 04/01/2020   Procedure: LEFT HEART CATH AND CORONARY ANGIOGRAPHY;  Surgeon: Wellington Hampshire, MD;  Location: [place] [facility];  Service: Cardiovascular;  Laterality: N/A;    Current Medications: Current Meds  Medication Sig   acetaminophen (TYLENOL) 325 MG tablet Take 2 tablets (650 mg total) by mouth every 4 (four) hours as needed for headache or mild pain.   Adalimumab 40 MG/0.8ML PNKT Inject 0.4 mLs into the skin every 14 (fourteen) days.    amLODipine-valsartan (EXFORGE) 10-320 MG tablet Take 1 tablet by mouth daily.   ascorbic acid (VITAMIN C) 1000 MG tablet Take 1,000 mg by mouth daily.    aspirin 81 MG tablet Take 81 mg by mouth daily.   cetirizine (ZYRTEC) 5 MG tablet Take 5 mg by mouth daily.   Cholecalciferol 25 MCG (1000 UT) tablet Take 1,000 Units by mouth daily.    clobetasol cream (TEMOVATE) 4.19 % Apply 1 application topically 2 (two) times daily as needed (Psoriasis).    clopidogrel (PLAVIX) 75 MG tablet Take 1 tablet (75 mg total) by mouth daily.   exenatide (BYETTA 10  MCG PEN) 10 MCG/0.04ML SOPN injection Inject into the skin 2 (two) times daily with a meal. 2.4 ml   Ginkgo Biloba 60 MG CAPS Take 60 mg by mouth in the morning and at bedtime.    glimepiride (AMARYL) 4 MG tablet Take 4 mg by mouth daily with breakfast.   KRILL OIL PO Take 325 mg by mouth daily.   metFORMIN (GLUCOPHAGE) 1000 MG tablet Take 1 tablet (1,000 mg total) by mouth 2 (two) times daily with a meal.   nitroGLYCERIN (NITROSTAT) 0.4 MG SL tablet Place 1 tablet (0.4 mg total) under the tongue  every 5 (five) minutes as needed for chest pain.   rosuvastatin (CRESTOR) 40 MG tablet Take 1 tablet (40 mg total) by mouth at bedtime.   triamcinolone (NASACORT) 55 MCG/ACT AERO nasal inhaler Place 1 spray into the nose daily as needed (Congestion).    VITAMIN D PO Take by mouth daily.   vitamin E 180 MG (400 UNITS) capsule Take 400 Units by mouth daily.    Zinc 30 MG TABS Take 30 mg by mouth daily.    [DISCONTINUED] metoprolol succinate (TOPROL-XL) 25 MG 24 hr tablet Take 12.5 mg (0.5 tablet) by mouth once daily     Allergies:   Atorvastatin   Social History   Socioeconomic History   Marital status: Married    Spouse name: Not on file   Number of children: Not on file   Years of education: Not on file   Highest education level: Not on file  Occupational History   Not on file  Tobacco Use   Smoking status: Former    Years: 30.00    Pack years: 0.00    Types: Cigarettes    Quit date: 2009    Years since quitting: 13.5   Smokeless tobacco: Never  Vaping Use   Vaping Use: Never used  Substance and Sexual Activity   Alcohol use: Yes    Comment: occasional alcohol intake   Drug use: Never   Sexual activity: Not on file  Other Topics Concern   Not on file  Social History Narrative   Not on file   Social Determinants of Health   Financial Resource Strain: Not on file  Food Insecurity: Not on file  Transportation Needs: Not on file  Physical Activity: Not on file  Stress: Not on file  Social Connections: Not on file     Family History: Father heart and MI at age 43.  ROS:   Please see the history of present illness.     All other systems reviewed and are negative.  EKGs/Labs/Other Studies Reviewed:    The following studies were reviewed today:   EKG:  EKG is  ordered today.  The ekg ordered today demonstrates normal sinus rhythm, incomplete right bundle branch block  Recent Labs: 04/11/2020: Hemoglobin 10.2; Platelets 251 05/24/2020: ALT 35; BUN 20;  Creatinine, Ser 1.20; Potassium 4.0; Sodium 139  Recent Lipid Panel    Component Value Date/Time   CHOL 92 05/24/2020 1116   CHOL 169 03/28/2020 1025   TRIG 59 05/24/2020 1116   HDL 36 (L) 05/24/2020 1116   HDL 39 (L) 03/28/2020 1025   CHOLHDL 2.6 05/24/2020 1116   VLDL 12 05/24/2020 1116   LDLCALC 44 05/24/2020 1116   LDLCALC 96 03/28/2020 1025    Physical Exam:    VS:  BP 112/64 (BP Location: Left Arm, Patient Position: Sitting, Cuff Size: Normal)   Pulse 81   Ht 5\' 7"  (1.702  m)   Wt 167 lb (75.8 kg)   BMI 26.16 kg/m     Wt Readings from Last 3 Encounters:  01/17/21 167 lb (75.8 kg)  12/26/20 169 lb (76.7 kg)  11/25/20 166 lb (75.3 kg)     GEN:  Well nourished, well developed in no acute distress HEENT: Normal NECK: No JVD; No carotid bruits LYMPHATICS: No lymphadenopathy CARDIAC: RRR, no murmurs, rubs, gallops RESPIRATORY:  Clear to auscultation without rales, wheezing or rhonchi  ABDOMEN: Soft, non-tender, non-distended MUSCULOSKELETAL:  No edema; No deformity  SKIN: Warm and dry NEUROLOGIC:  Alert and oriented x 3 PSYCHIATRIC:  Normal affect   ASSESSMENT:    1. Precordial pain   2. Coronary artery disease involving native coronary artery, unspecified whether angina present, unspecified whether native or transplanted heart   3. Primary hypertension   4. Hyperlipidemia LDL goal <70   5. Dyspnea on exertion      PLAN:    In order of problems listed above:  Chest pain consistent with angina, associated with shortness of breath.  Patient is high risk due to prior PCI x2 to LAD on 03/2020.  Last echo with preserved EF.  Plan for left heart cath to evaluate any significant obstruction.  Increase Toprol-XL to 25 mg daily for antianginal benefit.  Patient had adverse effects with Imdur. CAD/PCI x2 to LAD 03/2020, RCA 85%, not intervened on, small nondominant RCA.     Continue aspirin, Plavix, Crestor.  Toprol-XL as above. Hypertension, BP controlled.  Toprol-XL,  Exforge Hyperlipidemia, LDL at goal.  Continue Crestor Shortness of breath, daytime somnolence, fatigue, prior smoking history, chest CT 03/2020 with possible small airway disease.  Keep appointment with pulmonary medicine when scheduled.  Follow-up after left heart cath.  Shared Decision Making/Informed Consent The risks [stroke (1 in 1000), death (1 in 1000), kidney failure [usually temporary] (1 in 500), bleeding (1 in 200), allergic reaction [possibly serious] (1 in 200)], benefits (diagnostic support and management of coronary artery disease) and alternatives of a cardiac catheterization were discussed in detail with Mr. Freilich and he is willing to proceed.    Medication Adjustments/Labs and Tests Ordered: Current medicines are reviewed at length with the patient today.  Concerns regarding medicines are outlined above.  Orders Placed This Encounter  Procedures   Basic metabolic panel   CBC   EKG 12-Lead     Meds ordered this encounter  Medications   metoprolol succinate (TOPROL-XL) 25 MG 24 hr tablet    Sig: Take 1 tablet (25 mg total) by mouth daily.    Dispense:  90 tablet    Refill:  3      Patient Instructions  Medication Instructions:   Your physician has recommended you make the following change in your medication:   INCREASE your Toprol XL to 25 MG once a day.  *If you need a refill on your cardiac medications before your next appointment, please call your pharmacy*   Lab Work:  BMP drawn today  If you have labs (blood work) drawn today and your tests are completely normal, you will receive your results only by: [place] (if you have MyChart) OR A paper copy in the mail If you have any lab test that is abnormal or we need to change your treatment, we will call you to review the results.   Testing/Procedures:  You are scheduled for a Cardiac Catheterization on Monday, July 18 with Dr. Kathlyn Sacramento.  1. Please arrive at the [facility] at  8:30 AM (This time is one hour before your procedure to ensure your preparation). Free valet parking service is available.   Special note: Every effort is made to have your procedure done on time. Please understand that emergencies sometimes delay scheduled procedures.  2. Diet: Do not eat solid foods after midnight.  The patient may have clear liquids until 5am upon the day of the procedure.  3. Labs: Drawn in office  4. Medication instructions in preparation for your procedure:   Contrast Allergy: No   Do not take Diabetes Med Glucophage (Metformin) on the day of the procedure and HOLD 48 HOURS AFTER THE PROCEDURE.  On the morning of your procedure, take your Plavix/Clopidogrel and any morning medicines NOT listed above.  You may use sips of water.  5. Plan for one night stay--bring personal belongings. 6. Bring a current list of your medications and current insurance cards. 7. You MUST have a responsible person to drive you home. 8. Someone MUST be with you the first 24 hours after you arrive home or your discharge will be delayed. 9. Please wear clothes that are easy to get on and off and wear slip-on shoes.   Follow-Up: At [facility], you and your health needs are our priority.  As part of our continuing mission to provide you with exceptional heart care, we have created designated Provider Care Teams.  These Care Teams include your primary Cardiologist (physician) and Advanced Practice Providers (APPs -  Physician Assistants and Nurse Practitioners) who all work together to provide you with the care you need, when you need it.  We recommend signing up for the patient portal called "MyChart".  Sign up information is provided on this After Visit Summary.  MyChart is used to connect with patients for Virtual Visits (Telemedicine).  Patients are able to view lab/test results, encounter notes, upcoming appointments, etc.  Non-urgent messages can be sent to your provider as well.   To  learn more about what you can do with MyChart, go to [email or web address].    Your next appointment:   3-4 weeks   The format for your next appointment:   In Person  Provider:   You may see Kate Sable, MD or one of the following Advanced Practice Providers on your designated Care Team:   Murray Hodgkins, NP Christell Faith, PA-C Marrianne Mood, PA-C Cadence Foster, [place]   Other Instructions    Signed, Kate Sable, MD  01/17/2021 12:58 PM    [place]

## 2021-01-17 NOTE — Patient Instructions (Signed)
Medication Instructions:   Your physician has recommended you make the following change in your medication:   INCREASE your Toprol XL to 25 MG once a day.  *If you need a refill on your cardiac medications before your next appointment, please call your pharmacy*   Lab Work:  BMP drawn today  If you have labs (blood work) drawn today and your tests are completely normal, you will receive your results only by: [place] (if you have MyChart) OR A paper copy in the mail If you have any lab test that is abnormal or we need to change your treatment, we will call you to review the results.   Testing/Procedures:  You are scheduled for a Cardiac Catheterization on Monday, July 18 with Dr. Kathlyn Sacramento.  1. Please arrive at the [facility] at 8:30 AM (This time is one hour before your procedure to ensure your preparation). Free valet parking service is available.   Special note: Every effort is made to have your procedure done on time. Please understand that emergencies sometimes delay scheduled procedures.  2. Diet: Do not eat solid foods after midnight.  The patient may have clear liquids until 5am upon the day of the procedure.  3. Labs: Drawn in office  4. Medication instructions in preparation for your procedure:   Contrast Allergy: No   Do not take Diabetes Med Glucophage (Metformin) on the day of the procedure and HOLD 48 HOURS AFTER THE PROCEDURE.  On the morning of your procedure, take your Plavix/Clopidogrel and any morning medicines NOT listed above.  You may use sips of water.  5. Plan for one night stay--bring personal belongings. 6. Bring a current list of your medications and current insurance cards. 7. You MUST have a responsible person to drive you home. 8. Someone MUST be with you the first 24 hours after you arrive home or your discharge will be delayed. 9. Please wear clothes that are easy to get on and off and wear slip-on shoes.   Follow-Up: At  [facility], you and your health needs are our priority.  As part of our continuing mission to provide you with exceptional heart care, we have created designated Provider Care Teams.  These Care Teams include your primary Cardiologist (physician) and Advanced Practice Providers (APPs -  Physician Assistants and Nurse Practitioners) who all work together to provide you with the care you need, when you need it.  We recommend signing up for the patient portal called "MyChart".  Sign up information is provided on this After Visit Summary.  MyChart is used to connect with patients for Virtual Visits (Telemedicine).  Patients are able to view lab/test results, encounter notes, upcoming appointments, etc.  Non-urgent messages can be sent to your provider as well.   To learn more about what you can do with MyChart, go to [email or web address].    Your next appointment:   3-4 weeks   The format for your next appointment:   In Person  Provider:   You may see Kate Sable, MD or one of the following Advanced Practice Providers on your designated Care Team:   Murray Hodgkins, NP Christell Faith, PA-C Marrianne Mood, PA-C Cadence Kathlen Mody, [place]   Other Instructions

## 2021-01-18 ENCOUNTER — Emergency Department: Payer: BC Managed Care – PPO

## 2021-01-18 ENCOUNTER — Other Ambulatory Visit: Payer: Self-pay

## 2021-01-18 ENCOUNTER — Inpatient Hospital Stay: Payer: BC Managed Care – PPO

## 2021-01-18 ENCOUNTER — Inpatient Hospital Stay
Admission: EM | Admit: 2021-01-18 | Discharge: 2021-01-20 | DRG: 812 | Disposition: A | Discharge status: Home / Self Care | Payer: BC Managed Care – PPO | Attending: Internal Medicine | Admitting: Internal Medicine

## 2021-01-18 ENCOUNTER — Telehealth: Payer: Self-pay | Admitting: Physician Assistant

## 2021-01-18 DIAGNOSIS — R195 Other fecal abnormalities: Secondary | ICD-10-CM | POA: Diagnosis present

## 2021-01-18 DIAGNOSIS — Z87891 Personal history of nicotine dependence: Secondary | ICD-10-CM | POA: Diagnosis not present

## 2021-01-18 DIAGNOSIS — D62 Acute posthemorrhagic anemia: Secondary | ICD-10-CM | POA: Diagnosis not present

## 2021-01-18 DIAGNOSIS — Z79899 Other long term (current) drug therapy: Secondary | ICD-10-CM | POA: Diagnosis not present

## 2021-01-18 DIAGNOSIS — Z01810 Encounter for preprocedural cardiovascular examination: Secondary | ICD-10-CM | POA: Diagnosis not present

## 2021-01-18 DIAGNOSIS — D509 Iron deficiency anemia, unspecified: Principal | ICD-10-CM | POA: Diagnosis present

## 2021-01-18 DIAGNOSIS — Z95820 Peripheral vascular angioplasty status with implants and grafts: Secondary | ICD-10-CM | POA: Diagnosis not present

## 2021-01-18 DIAGNOSIS — L4050 Arthropathic psoriasis, unspecified: Secondary | ICD-10-CM | POA: Diagnosis present

## 2021-01-18 DIAGNOSIS — Z7984 Long term (current) use of oral hypoglycemic drugs: Secondary | ICD-10-CM | POA: Diagnosis not present

## 2021-01-18 DIAGNOSIS — Z7902 Long term (current) use of antithrombotics/antiplatelets: Secondary | ICD-10-CM | POA: Diagnosis not present

## 2021-01-18 DIAGNOSIS — R197 Diarrhea, unspecified: Secondary | ICD-10-CM | POA: Diagnosis present

## 2021-01-18 DIAGNOSIS — D6489 Other specified anemias: Secondary | ICD-10-CM | POA: Diagnosis not present

## 2021-01-18 DIAGNOSIS — I10 Essential (primary) hypertension: Secondary | ICD-10-CM | POA: Diagnosis present

## 2021-01-18 DIAGNOSIS — Z8249 Family history of ischemic heart disease and other diseases of the circulatory system: Secondary | ICD-10-CM | POA: Diagnosis not present

## 2021-01-18 DIAGNOSIS — E1121 Type 2 diabetes mellitus with diabetic nephropathy: Secondary | ICD-10-CM | POA: Diagnosis present

## 2021-01-18 DIAGNOSIS — I25118 Atherosclerotic heart disease of native coronary artery with other forms of angina pectoris: Secondary | ICD-10-CM | POA: Diagnosis not present

## 2021-01-18 DIAGNOSIS — IMO0002 Reserved for concepts with insufficient information to code with codable children: Secondary | ICD-10-CM | POA: Diagnosis present

## 2021-01-18 DIAGNOSIS — E785 Hyperlipidemia, unspecified: Secondary | ICD-10-CM | POA: Diagnosis present

## 2021-01-18 DIAGNOSIS — I2510 Atherosclerotic heart disease of native coronary artery without angina pectoris: Secondary | ICD-10-CM | POA: Diagnosis present

## 2021-01-18 DIAGNOSIS — Z7982 Long term (current) use of aspirin: Secondary | ICD-10-CM | POA: Diagnosis not present

## 2021-01-18 DIAGNOSIS — Z20822 Contact with and (suspected) exposure to covid-19: Secondary | ICD-10-CM | POA: Diagnosis present

## 2021-01-18 DIAGNOSIS — Z955 Presence of coronary angioplasty implant and graft: Secondary | ICD-10-CM

## 2021-01-18 DIAGNOSIS — Z888 Allergy status to other drugs, medicaments and biological substances status: Secondary | ICD-10-CM

## 2021-01-18 DIAGNOSIS — N179 Acute kidney failure, unspecified: Secondary | ICD-10-CM | POA: Diagnosis present

## 2021-01-18 DIAGNOSIS — E1165 Type 2 diabetes mellitus with hyperglycemia: Secondary | ICD-10-CM

## 2021-01-18 DIAGNOSIS — D649 Anemia, unspecified: Secondary | ICD-10-CM

## 2021-01-18 LAB — IRON AND TIBC
Iron: 24 ug/dL — ABNORMAL LOW (ref 45–182)
Saturation Ratios: 5 % — ABNORMAL LOW (ref 17.9–39.5)
TIBC: 463 ug/dL — ABNORMAL HIGH (ref 250–450)
UIBC: 439 ug/dL

## 2021-01-18 LAB — PROTIME-INR
INR: 1.1 (ref 0.8–1.2)
Prothrombin Time: 13.7 seconds (ref 11.4–15.2)

## 2021-01-18 LAB — PROTEIN / CREATININE RATIO, URINE
Creatinine, Urine: 66 mg/dL
Protein Creatinine Ratio: 1.41 mg/mg{Cre} — ABNORMAL HIGH (ref 0.00–0.15)
Total Protein, Urine: 93 mg/dL

## 2021-01-18 LAB — URINALYSIS, COMPLETE (UACMP) WITH MICROSCOPIC
Bacteria, UA: NONE SEEN
Bilirubin Urine: NEGATIVE
Glucose, UA: NEGATIVE mg/dL
Hgb urine dipstick: NEGATIVE
Ketones, ur: NEGATIVE mg/dL
Leukocytes,Ua: NEGATIVE
Nitrite: NEGATIVE
Protein, ur: 30 mg/dL — AB
Specific Gravity, Urine: 1.013 (ref 1.005–1.030)
pH: 6 (ref 5.0–8.0)

## 2021-01-18 LAB — CBC
HCT: 23.8 % — ABNORMAL LOW (ref 39.0–52.0)
Hematocrit: 24.6 % — ABNORMAL LOW (ref 37.5–51.0)
Hemoglobin: 7.5 g/dL — ABNORMAL LOW (ref 13.0–17.7)
Hemoglobin: 7.1 g/dL — ABNORMAL LOW (ref 13.0–17.0)
MCH: 23.7 pg — ABNORMAL LOW (ref 26.6–33.0)
MCH: 24.4 pg — ABNORMAL LOW (ref 26.0–34.0)
MCHC: 30.5 g/dL — ABNORMAL LOW (ref 31.5–35.7)
MCHC: 29.8 g/dL — ABNORMAL LOW (ref 30.0–36.0)
MCV: 78 fL — ABNORMAL LOW (ref 79–97)
MCV: 81.8 fL (ref 80.0–100.0)
Platelets: 347 10*3/uL (ref 150–450)
Platelets: 307 10*3/uL (ref 150–400)
RBC: 3.17 x10E6/uL — ABNORMAL LOW (ref 4.14–5.80)
RBC: 2.91 MIL/uL — ABNORMAL LOW (ref 4.22–5.81)
RDW: 14.4 % (ref 11.6–15.4)
RDW: 14.9 % (ref 11.5–15.5)
WBC: 4.9 10*3/uL (ref 3.4–10.8)
WBC: 6.2 10*3/uL (ref 4.0–10.5)
nRBC: 0 % (ref 0.0–0.2)

## 2021-01-18 LAB — COMPREHENSIVE METABOLIC PANEL
ALT: 17 U/L (ref 0–44)
AST: 29 U/L (ref 15–41)
Albumin: 3.7 g/dL (ref 3.5–5.0)
Alkaline Phosphatase: 60 U/L (ref 38–126)
Anion gap: 6 (ref 5–15)
BUN: 26 mg/dL — ABNORMAL HIGH (ref 6–20)
CO2: 23 mmol/L (ref 22–32)
Calcium: 8.8 mg/dL — ABNORMAL LOW (ref 8.9–10.3)
Chloride: 112 mmol/L — ABNORMAL HIGH (ref 98–111)
Creatinine, Ser: 1.88 mg/dL — ABNORMAL HIGH (ref 0.61–1.24)
GFR, Estimated: 41 mL/min — ABNORMAL LOW (ref >60)
Glucose, Bld: 138 mg/dL — ABNORMAL HIGH (ref 70–99)
Potassium: 4.7 mmol/L (ref 3.5–5.1)
Sodium: 141 mmol/L (ref 135–145)
Total Bilirubin: 0.5 mg/dL (ref 0.3–1.2)
Total Protein: 7.2 g/dL (ref 6.5–8.1)

## 2021-01-18 LAB — SODIUM, URINE, RANDOM: Sodium, Ur: 117 mmol/L

## 2021-01-18 LAB — BASIC METABOLIC PANEL
BUN/Creatinine Ratio: 13 (ref 9–20)
BUN: 24 mg/dL (ref 6–24)
CO2: 21 mmol/L (ref 20–29)
Calcium: 9 mg/dL (ref 8.7–10.2)
Chloride: 113 mmol/L — ABNORMAL HIGH (ref 96–106)
Creatinine, Ser: 1.81 mg/dL — ABNORMAL HIGH (ref 0.76–1.27)
Glucose: 122 mg/dL — ABNORMAL HIGH (ref 65–99)
Potassium: 4.9 mmol/L (ref 3.5–5.2)
Sodium: 146 mmol/L — ABNORMAL HIGH (ref 134–144)
eGFR: 43 mL/min/{1.73_m2} — ABNORMAL LOW (ref >59)

## 2021-01-18 LAB — FERRITIN: Ferritin: 4 ng/mL — ABNORMAL LOW (ref 24–336)

## 2021-01-18 LAB — TROPONIN I (HIGH SENSITIVITY)
Troponin I (High Sensitivity): 5 ng/L (ref <18)
Troponin I (High Sensitivity): 5 ng/L (ref <18)

## 2021-01-18 LAB — RESP PANEL BY RT-PCR (FLU A&B, COVID) ARPGX2
Influenza A by PCR: NEGATIVE
Influenza B by PCR: NEGATIVE
SARS Coronavirus 2 by RT PCR: NEGATIVE

## 2021-01-18 LAB — ABO/RH: ABO/RH(D): O POS

## 2021-01-18 LAB — GLUCOSE, CAPILLARY: Glucose-Capillary: 108 mg/dL — ABNORMAL HIGH (ref 70–99)

## 2021-01-18 LAB — HEMOGLOBIN: Hemoglobin: 7.4 g/dL — ABNORMAL LOW (ref 13.0–17.0)

## 2021-01-18 MED ORDER — ROSUVASTATIN CALCIUM 10 MG PO TABS
40.0000 mg | ORAL_TABLET | Freq: Every day | ORAL | Status: DC
  Administered 2021-01-18 – 2021-01-19 (×2): 40 mg via ORAL
  Filled 2021-01-18 (×2): qty 4
  Filled 2021-01-18: qty 2

## 2021-01-18 MED ORDER — NITROGLYCERIN 0.4 MG SL SUBL: 0.4000 mg | SUBLINGUAL_TABLET | SUBLINGUAL | Status: DC | PRN

## 2021-01-18 MED ORDER — OXYCODONE HCL 5 MG PO TABS: 5.0000 mg | ORAL_TABLET | ORAL | Status: DC | PRN

## 2021-01-18 MED ORDER — AMLODIPINE BESYLATE 10 MG PO TABS
10.0000 mg | ORAL_TABLET | Freq: Every day | ORAL | Status: DC
  Administered 2021-01-18: 10 mg via ORAL
  Filled 2021-01-18: qty 1

## 2021-01-18 MED ORDER — PANTOPRAZOLE SODIUM 40 MG IV SOLR
40.0000 mg | Freq: Two times a day (BID) | INTRAVENOUS | Status: DC
  Administered 2021-01-18 – 2021-01-20 (×4): 40 mg via INTRAVENOUS
  Filled 2021-01-18 (×5): qty 40

## 2021-01-18 MED ORDER — ONDANSETRON HCL 4 MG/2ML IJ SOLN: 4.0000 mg | Freq: Four times a day (QID) | INTRAMUSCULAR | Status: DC | PRN

## 2021-01-18 MED ORDER — SODIUM CHLORIDE 0.9 % IV SOLN
10.0000 mL/h | Freq: Once | INTRAVENOUS | Status: AC
  Administered 2021-01-18: 10 mL/h via INTRAVENOUS

## 2021-01-18 MED ORDER — INSULIN ASPART 100 UNIT/ML IJ SOLN
0.0000 [IU] | Freq: Three times a day (TID) | INTRAMUSCULAR | Status: DC
Start: 2021-01-19 — End: 2021-01-20
  Filled 2021-01-18 (×2): qty 1

## 2021-01-18 MED ORDER — POLYETHYLENE GLYCOL 3350 17 G PO PACK: 17.0000 g | PACK | Freq: Every day | ORAL | Status: DC | PRN

## 2021-01-18 MED ORDER — ACETAMINOPHEN 325 MG PO TABS: 650.0000 mg | ORAL_TABLET | Freq: Four times a day (QID) | ORAL | Status: DC | PRN

## 2021-01-18 MED ORDER — LACTATED RINGERS IV BOLUS
1000.0000 mL | Freq: Once | INTRAVENOUS | Status: AC
  Administered 2021-01-18: 1000 mL via INTRAVENOUS

## 2021-01-18 MED ORDER — CLOPIDOGREL BISULFATE 75 MG PO TABS: 75.0000 mg | ORAL_TABLET | Freq: Every day | ORAL | Status: DC

## 2021-01-18 MED ORDER — ASPIRIN EC 81 MG PO TBEC: 81.0000 mg | DELAYED_RELEASE_TABLET | Freq: Every day | ORAL | Status: DC

## 2021-01-18 MED ORDER — TRAZODONE HCL 50 MG PO TABS
50.0000 mg | ORAL_TABLET | Freq: Every evening | ORAL | Status: DC | PRN
  Filled 2021-01-18: qty 1

## 2021-01-18 MED ORDER — ONDANSETRON HCL 4 MG PO TABS: 4.0000 mg | ORAL_TABLET | Freq: Four times a day (QID) | ORAL | Status: DC | PRN

## 2021-01-18 MED ORDER — SODIUM CHLORIDE 0.9% FLUSH
3.0000 mL | Freq: Two times a day (BID) | INTRAVENOUS | Status: DC
  Administered 2021-01-18 – 2021-01-20 (×4): 3 mL via INTRAVENOUS

## 2021-01-18 MED ORDER — INSULIN ASPART 100 UNIT/ML IJ SOLN: 0.0000 [IU] | Freq: Every day | INTRAMUSCULAR | Status: DC

## 2021-01-18 MED ORDER — METOPROLOL SUCCINATE ER 25 MG PO TB24
25.0000 mg | ORAL_TABLET | Freq: Every day | ORAL | Status: DC
  Administered 2021-01-18 – 2021-01-20 (×3): 25 mg via ORAL
  Filled 2021-01-18 (×3): qty 1

## 2021-01-18 MED ORDER — METFORMIN HCL 500 MG PO TABS
1000.0000 mg | ORAL_TABLET | Freq: Every day | ORAL | Status: DC
  Administered 2021-01-18 – 2021-01-19 (×2): 1000 mg via ORAL
  Filled 2021-01-18 (×2): qty 2

## 2021-01-18 MED ORDER — ACETAMINOPHEN 650 MG RE SUPP: 650.0000 mg | Freq: Four times a day (QID) | RECTAL | Status: DC | PRN

## 2021-01-18 NOTE — ED Notes (Signed)
ED Provider at bedside. 

## 2021-01-18 NOTE — Telephone Encounter (Signed)
      Received page from patient after they had reviewed labs through open chart.  In short, this is a 60 year old male with history of CAD status post PCI x2 to the LAD in 03/2020, DM2, HTN, HLD, anemia, and prior tobacco use who was seen in the office on 01/17/2021 with exertional chest pressure and dyspnea.  Plan was to proceed with diagnostic LHC, which is scheduled for 01/27/2021.  Precath labs obtained at that time showed a worsening anemia with a hemoglobin of 7.5 with a baseline around 12-13 with last hemoglobin of 10.2 in late 03/2020.  BMP showed AKI with a serum creatinine of 1.81 with a baseline around 1.0-1.2.  Patient reviewed these labs through open chart and contacted on-call provider for further recommendations.  He continues to note exertional shortness of breath and chest discomfort.  Recommendations: - Given a 5 g hemoglobin drop when compared to his baseline, and in the context of AKI I recommend he proceed to the ED for further evaluation and possible PRBC and likely hospital admission for further work-up  - At this time, given AKI and 5 g hemoglobin drop, he is not a candidate for LHC until his anemia is further evaluated and corrected  - Charge nurse was notified of the patient's impending arrival  - Patient's last dose of aspirin and clopidogrel were this morning  - Cardiology available to consult for risk stratification of potential endoscopy   Will route to primary cardiologist and interventional cardiologist scheduled for LHC as an FYI.

## 2021-01-18 NOTE — ED Notes (Signed)
Responded to call bell. Pt asking to take his home med exenatide injector pen as prescribed by his PCM. Admitting orders for insulin and periodic CBG checks in Epic. Pt notified of current orders. 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 Dr Dione Plover contacted and stated that exenatide is potentially a cause for patients acute kidney injury and was held intentionally. He added metformin 1000mg  as requested.

## 2021-01-18 NOTE — ED Triage Notes (Signed)
Pt was seen at cardiologist Friday and had blood drawn for preop heart cath labs. HBG was 7.5 and MD sent pt here. Pt has had sob, chest pain for 2-3 months.

## 2021-01-18 NOTE — H&P (Signed)
[facility] History and Physical  Jesse Ray TFT:[id number] DOB: [birth year] DOA: 01/18/2021  Referring physician: Dr. Cinda Quest PCP: Adin Hector, MD   Chief Complaint: abnormal labs  HPI: Jesse Ray is a 60 y.o. male with history of CAD status post angioplasty and stent in September 2021, type 2 diabetes, hypertension, hyperlipidemia, psoriatic arthritis, who presents to the ED on the recommendation of his cardiologist after having abnormal blood work done in anticipation of upcoming heart catheterization.  Patient reports that he has been having exertional chest pain for the past several months.  This pain is worse with exertion and resolves with rest.  He has been seeing his cardiologist for this problem and adjusting medications, and also had a nuclear stress test that did not show any signs of ischemia and normal EF in May of this year.  Decision has been made to proceed with a left heart cath to better clarify the etiology of the symptoms and he was sent for blood work yesterday.  Unexpectedly his blood work showed a worsening of his anemia with hemoglobin of 7.5 as well as a worsening of his creatinine to 1.8, he called his cardiologist this morning to review these labs and was instructed to come to the ED for further work-up.  On my interview patient confirms the above history.  He reports that currently he feels fine.  He denies any abdominal pain, but does recall having 2 episodes of dark tarry stools over the past month.  He denies any bright red blood in his stool.  He drinks alcohol occasionally, does not smoke.  He denies regular use of over-the-counter NSAIDs, does use Tylenol on a regular basis.  He reports feeling somewhat thirsty of late but denies difficulty with p.o. intake or being dehydrated.  In the ED vital signs were unremarkable.  Repeat lab work continued to show AKI with creatinine of 1.8 above his baseline of 1.2, remainder of CMP unremarkable.  CBC  showed hemoglobin of 7.1, down from 10.2 on last check in September 2021.  Fecal Hemoccult was negative.  Troponin was normal.  EKG showed septal infarct in V2 and V3, no other changes and was unchanged from prior.  Iron studies were obtained which showed significant iron deficiency anemia with ferritin of 4, iron of 24.  He was consented for transfusion with 2 units packed red blood cells and admitted for further work-up of his AKI and iron deficiency anemia.  Review of Systems:  Pertinent positives and negative per HPI, all others reviewed and negative  Past Medical History:  Diagnosis Date   Coronary artery disease    Diabetes mellitus without complication ([place])    HLD (hyperlipidemia) 04/11/2020   Hyperlipidemia    Hypertension    Psoriatic arthritis ([place])    S/P angioplasty with stent 04/10/30 of proximal and mLAD DES and residual LAD stenosis 04/11/2020   Past Surgical History:  Procedure Laterality Date   CORONARY STENT INTERVENTION N/A 04/10/2020   Procedure: CORONARY STENT INTERVENTION;  Surgeon: Wellington Hampshire, MD;  Location: [facility];  Service: Cardiovascular;  Laterality: N/A;  LAD    INTRAVASCULAR ULTRASOUND/IVUS N/A 04/10/2020   Procedure: Intravascular Ultrasound/IVUS;  Surgeon: Wellington Hampshire, MD;  Location: [facility];  Service: Cardiovascular;  Laterality: N/A;  LAD   LEFT HEART CATH AND CORONARY ANGIOGRAPHY N/A 04/01/2020   Procedure: LEFT HEART CATH AND CORONARY ANGIOGRAPHY;  Surgeon: Wellington Hampshire, MD;  Location: [facility];  Service: Cardiovascular;  Laterality: N/A;   Social History:  reports that he quit smoking about 13 years ago. He has never used smokeless tobacco. He reports current alcohol use. He reports that he does not use drugs.  Allergies  Allergen Reactions   Atorvastatin     Other reaction(s): Muscle Pain    History reviewed. No pertinent family history.   Prior to Admission medications   Medication Sig Start  Date End Date Taking? Authorizing Provider  acetaminophen (TYLENOL) 325 MG tablet Take 2 tablets (650 mg total) by mouth every 4 (four) hours as needed for headache or mild pain. 04/11/20   Isaiah Serge, NP  Adalimumab 40 MG/0.8ML PNKT Inject 0.4 mLs into the skin every 14 (fourteen) days.     [provider]  amLODipine-valsartan (EXFORGE) 10-320 MG tablet Take 1 tablet by mouth daily.    [provider]  ascorbic acid (VITAMIN C) 1000 MG tablet Take 1,000 mg by mouth daily.     [provider]  aspirin 81 MG tablet Take 81 mg by mouth daily.    [provider]  cetirizine (ZYRTEC) 5 MG tablet Take 5 mg by mouth daily.    [provider]  Cholecalciferol 25 MCG (1000 UT) tablet Take 1,000 Units by mouth daily.     [provider]  clobetasol cream (TEMOVATE) 7.09 % Apply 1 application topically 2 (two) times daily as needed (Psoriasis).  01/27/20   [provider]  clopidogrel (PLAVIX) 75 MG tablet Take 1 tablet (75 mg total) by mouth daily. 12/17/20   Kate Sable, MD  exenatide (BYETTA 10 MCG PEN) 10 MCG/0.04ML SOPN injection Inject into the skin 2 (two) times daily with a meal. 2.4 ml 09/04/19   [provider]  Ginkgo Biloba 60 MG CAPS Take 60 mg by mouth in the morning and at bedtime.     [provider]  glimepiride (AMARYL) 4 MG tablet Take 4 mg by mouth daily with breakfast.    [provider]  KRILL OIL PO Take 325 mg by mouth daily.    [provider]  metFORMIN (GLUCOPHAGE) 1000 MG tablet Take 1 tablet (1,000 mg total) by mouth 2 (two) times daily with a meal. 04/13/20   Isaiah Serge, NP  metoprolol succinate (TOPROL-XL) 25 MG 24 hr tablet Take 1 tablet (25 mg total) by mouth daily. 01/17/21   Kate Sable, MD  nitroGLYCERIN (NITROSTAT) 0.4 MG SL tablet Place 1 tablet (0.4 mg total) under the tongue every 5 (five) minutes as needed for chest pain. 04/11/20   Isaiah Serge, NP   rosuvastatin (CRESTOR) 40 MG tablet Take 1 tablet (40 mg total) by mouth at bedtime. 04/11/20   Isaiah Serge, NP  triamcinolone (NASACORT) 55 MCG/ACT AERO nasal inhaler Place 1 spray into the nose daily as needed (Congestion).     [provider]  VITAMIN D PO Take by mouth daily.    [provider]  vitamin E 180 MG (400 UNITS) capsule Take 400 Units by mouth daily.     [provider]  Zinc 30 MG TABS Take 30 mg by mouth daily.     [provider]   Physical Exam: Vitals:   01/18/21 1521 01/18/21 1523  BP:  135/74  Pulse:  82  Resp:  18  Temp:  98 F (36.7 C)  TempSrc:  Oral  SpO2:  99%  Weight: 75 kg   Height: 5\' 7"  (1.702 m)     Wt  Readings from Last 3 Encounters:  01/18/21 75 kg  01/17/21 75.8 kg  12/26/20 76.7 kg     General:  Appears calm and comfortable Eyes: PERRL, normal lids, irises & conjunctiva ENT: grossly normal hearing, lips & tongue Neck: no masses Cardiovascular: RRR, no m/r/g. No LE edema. Telemetry: SR, no arrhythmias  Respiratory: CTA bilaterally, no w/r/r. Normal respiratory effort. Abdomen: soft, ntnd Skin: no rash or induration seen on limited exam Musculoskeletal: grossly normal tone BUE/BLE Psychiatric: grossly normal mood and affect, speech fluent and appropriate Neurologic: grossly non-focal.          Labs on Admission:  Basic Metabolic Panel: Recent Labs  Lab 01/17/21 0858 01/18/21 1529  NA 146* 141  K 4.9 4.7  CL 113* 112*  CO2 21 23  GLUCOSE 122* 138*  BUN 24 26*  CREATININE 1.81* 1.88*  CALCIUM 9.0 8.8*   Liver Function Tests: Recent Labs  Lab 01/18/21 1529  AST 29  ALT 17  ALKPHOS 60  BILITOT 0.5  PROT 7.2  ALBUMIN 3.7   No results for input(s): LIPASE, AMYLASE in the last 168 hours. No results for input(s): AMMONIA in the last 168 hours. CBC: Recent Labs  Lab 01/17/21 0906 01/18/21 1529  WBC 4.9 6.2  HGB 7.5* 7.1*  HCT 24.6* 23.8*  MCV 78* 81.8  PLT 347 307    Cardiac Enzymes: No results for input(s): CKTOTAL, CKMB, CKMBINDEX, TROPONINI in the last 168 hours.  BNP (last 3 results) No results for input(s): BNP in the last 8760 hours.  ProBNP (last 3 results) No results for input(s): PROBNP in the last 8760 hours.  CBG: No results for input(s): GLUCAP in the last 168 hours.  Radiological Exams on Admission: DG Chest 2 View  Result Date: 01/18/2021 CLINICAL DATA:  Shortness of breath, chest pain EXAM: CHEST - 2 VIEW COMPARISON:  None. FINDINGS: The heart size and mediastinal contours are within normal limits. Both lungs are clear. The visualized skeletal structures are unremarkable. IMPRESSION: No acute abnormality of the lungs. Electronically Signed   By: Eddie Candle M.D.   On: 01/18/2021 15:59    EKG: Independently reviewed. Sinus, q wave in V2-3, unchanged from prior.   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 male with history of CAD status post angioplasty and stent in September 2021, type 2 diabetes, hypertension, hyperlipidemia, psoriatic arthritis, who presents to the ED on the recommendation of his cardiologist after having abnormal blood work done in anticipation of upcoming heart catheterization.   #Iron deficiency anemia #Melanotic stools Given history suspect GI bleed as etiology of his anemia.  Does not have any significant risk factors for upper GI bleed beyond his DAPT for his CAD.  Consented and ordered for 2 units of packed red blood cells, will consult GI nonurgently to evaluate him as well as cardiology for risk stratification  for possible endoscopy. - Heart healthy/carb modified diet for time being as patient is unlikely to get endoscopy until at least Monday, reevaluate tomorrow once he has been evaluated by consultants - IV PPI twice daily - Maintain 2 large-bore IVs - Maintain  active type and screen - Follow-up cardiology recommendations regarding endoscopy - Follow-up GI recommendations - Recheck CBC in a.m.  #AKI Unclear etiology and chronicity.  Will bolus with fluids to see if this improves and recheck in the a.m.  Suspect it is more likely chronic than acute, given his multiple risk factors (hypertension, diabetes, etc.).  Byetta has also been implicated in kidney injury, will hold this medication. Will do basic initial work-up. - Bolus 1 L LR - Renal ultrasound - UA with microscopy and protein creatinine ratio - Check FeNa/FeUrea - Hold valsartan  #Known Medical Problems Psoriatic arthritis-hold adalimumab Hypertension-continue amlodipine, metoprolol.  Hold valsartan CAD-continue aspirin and Plavix given that patient is less than 1 year out from PCI with drug-eluting stents DM2-hold Byetta given AKI.  Hold glimepiride and metoprolol as well, start sensitive sliding scale insulin without basal for time being. Hyperlipidemia-continue rosuvastatin   Code Status: [place] Code, confirmed DVT Prophylaxis: SCDs Family Communication: Patient and wife updated on plan at bedside Disposition Plan: Inpatient, Med-Surg   Time spent: 44 min  Clarnce Flock MD/MPH [facility]  Note:  This document was prepared using Systems analyst and may include unintentional dictation errors.

## 2021-01-18 NOTE — ED Provider Notes (Signed)
[facility] Emergency Department Provider Note   ____________________________________________   Event Date/Time   First MD Initiated Contact with Patient 01/18/21 1534     (approximate)  I have reviewed the triage vital signs and the nursing notes.   HISTORY  Chief Complaint abnormal labs    HPI Jesse Ray is a 60 y.o. male patient has been feeling tired and short of breath for the last month or so.  He is also been having some chest tightness with exertion.  He went to see his cardiologist and lab work was done and he was told to come to the emergency room today because his hemoglobin was 7.5.  He reports he occasionally has black tarry stools and has been having some diarrhea on the weekends which he attributed to his metformin.  His sugars sometimes go up as high as 200 other times are in the 80s.  His symptoms of tiredness and no energy etc. are with exertion and have come on gradually.  He is currently having no pain or shortness of breath at all.  Past medical history as below.  Patient has had 2 stents in the past.        Past Medical History:  Diagnosis Date   Coronary artery disease    Diabetes mellitus without complication ([place])    HLD (hyperlipidemia) 04/11/2020   Hyperlipidemia    Hypertension    Psoriatic arthritis ([place])    S/P angioplasty with stent 04/10/30 of proximal and mLAD DES and residual LAD stenosis 04/11/2020    Patient Active Problem List   Diagnosis Date Noted   AKI (acute kidney injury) ([place]) 01/18/2021   HLD (hyperlipidemia) 04/11/2020   S/P angioplasty with stent 04/10/30 of proximal and mLAD DES and residual LAD stenosis 04/11/2020   Angina pectoris ([place]) 04/10/2020   Coronary artery disease of native artery of native heart with stable angina pectoris ([place])    Allergy 03/28/2020   Asthma 03/28/2020   Essential hypertension 03/28/2020   History of kidney stones 03/28/2020   Psoriatic arthritis ([place]) 03/28/2020    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 ([place]) 11/18/2018    Past Surgical History:  Procedure Laterality Date   CORONARY STENT INTERVENTION N/A 04/10/2020   Procedure: CORONARY STENT INTERVENTION;  Surgeon: Wellington Hampshire, MD;  Location: [facility];  Service: Cardiovascular;  Laterality: N/A;  LAD    INTRAVASCULAR ULTRASOUND/IVUS N/A 04/10/2020   Procedure: Intravascular Ultrasound/IVUS;  Surgeon: Wellington Hampshire, MD;  Location: [facility];  Service: Cardiovascular;  Laterality: N/A;  LAD   LEFT HEART CATH AND CORONARY ANGIOGRAPHY N/A 04/01/2020   Procedure: LEFT HEART CATH AND CORONARY ANGIOGRAPHY;  Surgeon: Wellington Hampshire, MD;  Location: [facility];  Service: Cardiovascular;  Laterality: N/A;    Prior to Admission medications   Medication Sig Start Date End Date Taking? Authorizing Provider  acetaminophen (TYLENOL) 325 MG tablet Take 2 tablets (650 mg total) by mouth every 4 (four) hours as needed for headache or mild pain. 04/11/20   Isaiah Serge, NP  Adalimumab 40 MG/0.8ML PNKT Inject 0.4 mLs into the skin every 14 (fourteen) days.     [provider]  amLODipine-valsartan (EXFORGE) 10-320 MG tablet Take 1 tablet by mouth daily.    [provider]  ascorbic acid (VITAMIN C) 1000 MG tablet Take 1,000 mg by mouth daily.     [provider]  aspirin 81 MG tablet Take 81 mg  by mouth daily.    [provider]  cetirizine (ZYRTEC) 5 MG tablet Take 5 mg by mouth daily.    [provider]  Cholecalciferol 25 MCG (1000 UT) tablet Take 1,000 Units by mouth daily.     [provider]  clobetasol cream (TEMOVATE) 7.16 % Apply 1 application topically 2 (two) times daily as needed (Psoriasis).  01/27/20   [provider]  clopidogrel (PLAVIX) 75 MG tablet Take 1 tablet (75 mg total) by mouth daily. 12/17/20   Kate Sable, MD  exenatide (BYETTA 10 MCG PEN) 10  MCG/0.04ML SOPN injection Inject into the skin 2 (two) times daily with a meal. 2.4 ml 09/04/19   [provider]  Ginkgo Biloba 60 MG CAPS Take 60 mg by mouth in the morning and at bedtime.     [provider]  glimepiride (AMARYL) 4 MG tablet Take 4 mg by mouth daily with breakfast.    [provider]  KRILL OIL PO Take 325 mg by mouth daily.    [provider]  metFORMIN (GLUCOPHAGE) 1000 MG tablet Take 1 tablet (1,000 mg total) by mouth 2 (two) times daily with a meal. 04/13/20   Isaiah Serge, NP  metoprolol succinate (TOPROL-XL) 25 MG 24 hr tablet Take 1 tablet (25 mg total) by mouth daily. 01/17/21   Kate Sable, MD  nitroGLYCERIN (NITROSTAT) 0.4 MG SL tablet Place 1 tablet (0.4 mg total) under the tongue every 5 (five) minutes as needed for chest pain. 04/11/20   Isaiah Serge, NP  rosuvastatin (CRESTOR) 40 MG tablet Take 1 tablet (40 mg total) by mouth at bedtime. 04/11/20   Isaiah Serge, NP  triamcinolone (NASACORT) 55 MCG/ACT AERO nasal inhaler Place 1 spray into the nose daily as needed (Congestion).     [provider]  VITAMIN D PO Take by mouth daily.    [provider]  vitamin E 180 MG (400 UNITS) capsule Take 400 Units by mouth daily.     [provider]  Zinc 30 MG TABS Take 30 mg by mouth daily.     [provider]    Allergies Atorvastatin  History reviewed. No pertinent family history.  Social History Social History   Tobacco Use   Smoking status: Former    Years: 30.00    Pack years: 0.00    Types: Cigarettes    Quit date: 2009    Years since quitting: 13.5   Smokeless tobacco: Never  Vaping Use   Vaping Use: Never used  Substance Use Topics   Alcohol use: Yes    Comment: occasional alcohol intake   Drug use: Never    Review of Systems Currently: Constitutional: No fever/chills Eyes: No visual changes. ENT: No sore throat. Cardiovascular: Denies chest pain. Respiratory:  Denies shortness of breath. Gastrointestinal: No abdominal pain.  No nausea, no vomiting.  No diarrhea.  No constipation. Genitourinary: Negative for dysuria. Musculoskeletal: Negative for back pain. Skin: Negative for rash. Neurological: Negative for headaches, focal weakness  ____________________________________________   PHYSICAL EXAM:  VITAL SIGNS: ED Triage Vitals  Enc Vitals Group     BP 01/18/21 1523 135/74     Pulse Rate 01/18/21 1523 82     Resp 01/18/21 1523 18     Temp 01/18/21 1523 98 F (36.7 C)     Temp Source 01/18/21 1523 Oral     SpO2 01/18/21 1523 99 %     Weight 01/18/21 1521 165 lb 5.5 oz (  75 kg)     Height 01/18/21 1521 5\' 7"  (1.702 m)     Head Circumference --      Peak Flow --      Pain Score 01/18/21 1521 0     Pain Loc --      Pain Edu? --      Excl. in [place]? --     Constitutional: Alert and oriented. Well appearing and in no acute distress. Eyes: Conjunctivae are normal. PERRL. EOMI. Head: Atraumatic. Nose: No congestion/rhinnorhea. Mouth/Throat: Mucous membranes are moist but pale.  Oropharynx non-erythematous. Neck: No stridor.  Cardiovascular: Normal rate, regular rhythm. Grossly normal heart sounds.  Good peripheral circulation. Respiratory: Normal respiratory effort.  No retractions. Lungs CTAB. Gastrointestinal: Soft and nontender. No distention. No abdominal bruits. No CVA tenderness. Genitourinary: Rectal exam normal prostate no masses are palpated no hemorrhoids are seen or palpated stool is brown and Hemoccult negative Musculoskeletal: No lower extremity tenderness nor edema.  No joint effusions. Neurologic:  Normal speech and language. No gross focal neurologic deficits are appreciated. No gait instability. Skin:  Skin is warm, dry and intact. No rash noted.   ____________________________________________   LABS (all labs ordered are listed, but only abnormal results are displayed)  Labs Reviewed  COMPREHENSIVE METABOLIC PANEL -  Abnormal; Notable for the following components:      Result Value   Chloride 112 (*)    Glucose, Bld 138 (*)    BUN 26 (*)    Creatinine, Ser 1.88 (*)    Calcium 8.8 (*)    GFR, Estimated 41 (*)    All other components within normal limits  CBC - Abnormal; Notable for the following components:   RBC 2.91 (*)    Hemoglobin 7.1 (*)    HCT 23.8 (*)    MCH 24.4 (*)    MCHC 29.8 (*)    All other components within normal limits  RESP PANEL BY RT-PCR (FLU A&B, COVID) ARPGX2  PROTIME-INR  IRON AND TIBC  FERRITIN  POC OCCULT BLOOD, ED  TYPE AND SCREEN  PREPARE RBC (CROSSMATCH)  ABO/RH  TROPONIN I (HIGH SENSITIVITY)   ____________________________________________  EKG EKG read interpreted by me shows normal sinus rhythm rate of 79 normal axis no acute ST-T wave changes there are S waves remaining in V5 and V6. ____________________________________________  RADIOLOGY Gertha Calkin, personally viewed and evaluated these images (plain radiographs) as part of my medical decision making, as well as reviewing the written report by the radiologist.  ED MD interpretation: Chest x-ray reviewed by me I am not seeing anything particularly worrisome although I will wait for the radiologist report  Official radiology report(s): DG Chest 2 View  Result Date: 01/18/2021 CLINICAL DATA:  Shortness of breath, chest pain EXAM: CHEST - 2 VIEW COMPARISON:  None. FINDINGS: The heart size and mediastinal contours are within normal limits. Both lungs are clear. The visualized skeletal structures are unremarkable. IMPRESSION: No acute abnormality of the lungs. Electronically Signed   By: Eddie Candle M.D.   On: 01/18/2021 15:59    ____________________________________________   PROCEDURES  Procedure(s) performed (including Critical Care):  Procedures   ____________________________________________   INITIAL IMPRESSION / ASSESSMENT AND PLAN / ED COURSE  Patient with history consistent with  gradual onset of anemia and symptomatic anemia.  He does take aspirin and Plavix currently there is no blood in his stool.  He does have a history of dark tarry stools.  The anemia may be the cause  of all of his symptoms.  We will get him in the hospital for transfusion.  I have discussed risks and benefits with him.  He has consented.  His wife was present in the room.  Patient will of course need a work-up to determine the cause of his anemia.  It may also be worthwhile to repeat a cardiac cath as was originally planned. ----------------------------------------- 4:24 PM on 01/18/2021 ----------------------------------------- Rudene Christians he has now returned he also has AKI it looks like his GFR is now 49 and it had been greater than 60 not very long ago.              ____________________________________________   FINAL CLINICAL IMPRESSION(S) / ED DIAGNOSES  Final diagnoses:  Symptomatic anemia  AKI (acute kidney injury) [facility])     ED Discharge Orders     None        Note:  This document was prepared using Dragon voice recognition software and may include unintentional dictation errors.    Nena Polio, MD 01/18/21 1726

## 2021-01-18 NOTE — ED Notes (Signed)
Patient is resting comfortably.  Blood transfusing.  No acute distress noted by RN.

## 2021-01-18 NOTE — Telephone Encounter (Signed)
Addendum:  Forgot to note on initial telephone note that the patient denies any hematochezia, melena, hematuria, hematemesis, or hemoptysis.

## 2021-01-19 ENCOUNTER — Encounter: Payer: Self-pay | Admitting: Family Medicine

## 2021-01-19 DIAGNOSIS — I10 Essential (primary) hypertension: Secondary | ICD-10-CM

## 2021-01-19 DIAGNOSIS — I25118 Atherosclerotic heart disease of native coronary artery with other forms of angina pectoris: Secondary | ICD-10-CM

## 2021-01-19 DIAGNOSIS — D6489 Other specified anemias: Secondary | ICD-10-CM

## 2021-01-19 DIAGNOSIS — D649 Anemia, unspecified: Secondary | ICD-10-CM

## 2021-01-19 DIAGNOSIS — E785 Hyperlipidemia, unspecified: Secondary | ICD-10-CM

## 2021-01-19 DIAGNOSIS — Z95820 Peripheral vascular angioplasty status with implants and grafts: Secondary | ICD-10-CM

## 2021-01-19 DIAGNOSIS — N179 Acute kidney failure, unspecified: Secondary | ICD-10-CM

## 2021-01-19 DIAGNOSIS — Z01810 Encounter for preprocedural cardiovascular examination: Secondary | ICD-10-CM

## 2021-01-19 DIAGNOSIS — L4050 Arthropathic psoriasis, unspecified: Secondary | ICD-10-CM

## 2021-01-19 LAB — CBC
HCT: 29.1 % — ABNORMAL LOW (ref 39.0–52.0)
Hemoglobin: 9.3 g/dL — ABNORMAL LOW (ref 13.0–17.0)
MCH: 25.8 pg — ABNORMAL LOW (ref 26.0–34.0)
MCHC: 32 g/dL (ref 30.0–36.0)
MCV: 80.6 fL (ref 80.0–100.0)
Platelets: 281 10*3/uL (ref 150–400)
RBC: 3.61 MIL/uL — ABNORMAL LOW (ref 4.22–5.81)
RDW: 15.8 % — ABNORMAL HIGH (ref 11.5–15.5)
WBC: 7.4 10*3/uL (ref 4.0–10.5)
nRBC: 0 % (ref 0.0–0.2)

## 2021-01-19 LAB — GLUCOSE, CAPILLARY
Glucose-Capillary: 75 mg/dL (ref 70–99)
Glucose-Capillary: 165 mg/dL — ABNORMAL HIGH (ref 70–99)
Glucose-Capillary: 276 mg/dL — ABNORMAL HIGH (ref 70–99)
Glucose-Capillary: 59 mg/dL — ABNORMAL LOW (ref 70–99)
Glucose-Capillary: 115 mg/dL — ABNORMAL HIGH (ref 70–99)

## 2021-01-19 LAB — BASIC METABOLIC PANEL
Anion gap: 3 — ABNORMAL LOW (ref 5–15)
BUN: 25 mg/dL — ABNORMAL HIGH (ref 6–20)
CO2: 25 mmol/L (ref 22–32)
Calcium: 8.8 mg/dL — ABNORMAL LOW (ref 8.9–10.3)
Chloride: 112 mmol/L — ABNORMAL HIGH (ref 98–111)
Creatinine, Ser: 1.76 mg/dL — ABNORMAL HIGH (ref 0.61–1.24)
GFR, Estimated: 44 mL/min — ABNORMAL LOW (ref >60)
Glucose, Bld: 62 mg/dL — ABNORMAL LOW (ref 70–99)
Potassium: 4.2 mmol/L (ref 3.5–5.1)
Sodium: 140 mmol/L (ref 135–145)

## 2021-01-19 LAB — HIV ANTIBODY (ROUTINE TESTING W REFLEX): HIV Screen 4th Generation wRfx: NONREACTIVE

## 2021-01-19 LAB — HEMOGLOBIN AND HEMATOCRIT, BLOOD
HCT: 27.2 % — ABNORMAL LOW (ref 39.0–52.0)
Hemoglobin: 8.8 g/dL — ABNORMAL LOW (ref 13.0–17.0)

## 2021-01-19 LAB — PREPARE RBC (CROSSMATCH)

## 2021-01-19 MED ORDER — ASPIRIN EC 81 MG PO TBEC
81.0000 mg | DELAYED_RELEASE_TABLET | Freq: Every day | ORAL | Status: DC
  Administered 2021-01-19 – 2021-01-20 (×2): 81 mg via ORAL
  Filled 2021-01-19 (×2): qty 1

## 2021-01-19 NOTE — Progress Notes (Signed)
Hypoglycemic Event  CBG: 59 at 2057  Treatment: 8 oz juice/soda  Symptoms: None  Follow-up CBG: Time:2149 CBG Result:115  Possible Reasons for Event: Unknown  Comments/MD notified:NA    Jaynie Crumble

## 2021-01-19 NOTE — Plan of Care (Signed)
  Problem: Education: Goal: Knowledge of General Education information will improve Description: Including pain rating scale, medication(s)/side effects and non-pharmacologic comfort measures 01/19/2021 1156 by Cristela Blue, RN Outcome: Progressing 01/19/2021 1156 by Cristela Blue, RN Outcome: Progressing   Problem: Health Behavior/Discharge Planning: Goal: Ability to manage health-related needs will improve 01/19/2021 1156 by Cristela Blue, RN Outcome: Progressing 01/19/2021 1156 by Cristela Blue, RN Outcome: Progressing   Problem: Clinical Measurements: Goal: Ability to maintain clinical measurements within normal limits will improve 01/19/2021 1156 by Cristela Blue, RN Outcome: Progressing 01/19/2021 1156 by Cristela Blue, RN Outcome: Progressing Goal: Will remain free from infection 01/19/2021 1156 by Cristela Blue, RN Outcome: Progressing 01/19/2021 1156 by Cristela Blue, RN Outcome: Progressing Goal: Diagnostic test results will improve 01/19/2021 1156 by Cristela Blue, RN Outcome: Progressing 01/19/2021 1156 by Cristela Blue, RN Outcome: Progressing Goal: Respiratory complications will improve 01/19/2021 1156 by Cristela Blue, RN Outcome: Progressing 01/19/2021 1156 by Cristela Blue, RN Outcome: Progressing Goal: Cardiovascular complication will be avoided 01/19/2021 1156 by Cristela Blue, RN Outcome: Progressing 01/19/2021 1156 by Cristela Blue, RN Outcome: Progressing   Problem: Activity: Goal: Risk for activity intolerance will decrease 01/19/2021 1156 by Cristela Blue, RN Outcome: Progressing 01/19/2021 1156 by Cristela Blue, RN Outcome: Progressing   Problem: Nutrition: Goal: Adequate nutrition will be maintained 01/19/2021 1156 by Cristela Blue, RN Outcome: Progressing 01/19/2021 1156 by Cristela Blue, RN Outcome: Progressing   Problem: Coping: Goal: Level of anxiety will decrease 01/19/2021 1156 by Cristela Blue, RN Outcome:  Progressing 01/19/2021 1156 by Cristela Blue, RN Outcome: Progressing   Problem: Elimination: Goal: Will not experience complications related to bowel motility 01/19/2021 1156 by Cristela Blue, RN Outcome: Progressing 01/19/2021 1156 by Cristela Blue, RN Outcome: Progressing Goal: Will not experience complications related to urinary retention 01/19/2021 1156 by Cristela Blue, RN Outcome: Progressing 01/19/2021 1156 by Cristela Blue, RN Outcome: Progressing   Problem: Pain Managment: Goal: General experience of comfort will improve 01/19/2021 1156 by Cristela Blue, RN Outcome: Progressing 01/19/2021 1156 by Cristela Blue, RN Outcome: Progressing   Problem: Safety: Goal: Ability to remain free from injury will improve 01/19/2021 1156 by Cristela Blue, RN Outcome: Progressing 01/19/2021 1156 by Cristela Blue, RN Outcome: Progressing   Problem: Skin Integrity: Goal: Risk for impaired skin integrity will decrease 01/19/2021 1156 by Cristela Blue, RN Outcome: Progressing 01/19/2021 1156 by Cristela Blue, RN Outcome: Progressing

## 2021-01-19 NOTE — Progress Notes (Signed)
Despite having elevated blood sugars, patient is refusing the administration of Sliding Scale Insulin. 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 Dr. Kurtis Bushman made aware.

## 2021-01-19 NOTE — Consult Note (Signed)
Cardiology Consultation:   Patient ID: Jesse Ray; [id number]; 06-11-1961   Admit date: 01/18/2021 Date of Consult: 01/19/2021  Primary Care Provider: Adin Hector, MD Primary Cardiologist: Agbor-Etang Primary Electrophysiologist:  None   Patient Profile:   Jesse Ray is a 60 y.o. male with a hx of CAD status post PCI/DES x2 to the LAD in 03/2020, DM2 with diabetic nephropathy, family history of premature CAD in his father having had an MI at 77, HTN, and HLD who is being seen today for preprocedural risk stratification at the request of Dr. Dione Plover.  History of Present Illness:   Jesse Ray was seen in consult by cardiology in 03/2020 for evaluation of chest pain.  Calcium score of 1424.  Subsequent diagnostic LHC on 04/01/2020 showed significant one-vessel CAD involving the LAD with significant proximal and mid disease with significant calcifications.  The ostial LAD also had moderate stenosis.  There was a significant proximal RCA stenosis, but the vessel was small and nondominant.  Best option for revascularization was likely IVUS guided PCI of the LAD with likely atherectomy or intravascular lithotripsy.  This was scheduled for later that same week at [facility].  He underwent repeat cath on 04/10/2020 with successful IVUS guided angioplasty and DES placement to the proximal and mid LAD using 2 nonoverlapping stents.  This was overall a difficult procedure due to calcifications.  Intravascular lithotripsy was attempted but had to be aborted due to malfunction in the device which did not resolve after exchanging the catheter.  Echo at that time showed a hyperdynamic LV SF with an EF of 65 to 70%, no regional wall motion abnormalities, normal LV diastolic function parameters, normal RV systolic function and ventricular cavity size, no significant valvular abnormalities, and an estimated right atrial pressure of 3 mmHg.  As part of FM CSA guidelines, he underwent ETT in 06/2020  which showed no evidence of inducible ischemia with optimal exercise for stated age and was overall normal.  More recently, he was seen in 11/2020 with exertional chest pain and shortness of breath.  Subsequent Lexiscan MPI on 12/06/2020 showed no significant ischemia with an EF of 74%, and was overall a low risk scan.  CT attenuation corrected images showed significant coronary artery calcification, most notably in the LAD territory.  Medical therapy was escalated with addition of Imdur, though this was stopped due to headache.  He also indicated [place] led to some chest discomfort.  He was most recently seen on 01/17/2021, and continued to note exertional angina and dyspnea.  In this setting he was scheduled for LHC on 01/27/2021.  Precath labs were obtained at his visit on 7/8.  Patient called the on-call answering service on 7/9 after reviewing his precath labs through open chart which demonstrated a worsening anemia with a hemoglobin of 7.5 with a baseline around 12-13 as well as AKI with a serum creatinine of 1.81 with a baseline around 1-1.2.  When he was contacted on 7/9 he denied any hematochezia, melena, hemoptysis, hematemesis, or hematuria.  He was advised to proceed to the ED for further evaluation of his anemia and AKI.  Upon his arrival to the ED, vital signs were stable.  High-sensitivity troponin negative x2.  Repeat labs showed continued downtrending of hemoglobin to 7.1 and BUN/SCr of 26/1.88.  Iron low at 24.  Ferritin low at 4.  Occult blood negative.  Chest x-ray showed no acute abnormalities.  Renal ultrasound was unremarkable.  EKG showed sinus rhythm, 79  bpm, possible prior septal infarct, no acute ST-T changes.  Upon admission 2 units of pRBC were recommended.  Follow-up labs are pending this morning.  Currently, he is without angina or dyspnea. He also notes positional dizziness and palpitations are improved following pRBC.    Revised Cardiac Risk Index: low risk for noncardiac  procedure [facility] Activity Status Index: > 4 METs without cardiac limitation    Past Medical History:  Diagnosis Date   Coronary artery disease    Diabetes mellitus without complication (HCC)    HLD (hyperlipidemia) 04/11/2020   Hyperlipidemia    Hypertension    Psoriatic arthritis ([place])    S/P angioplasty with stent 04/10/30 of proximal and mLAD DES and residual LAD stenosis 04/11/2020    Past Surgical History:  Procedure Laterality Date   CORONARY STENT INTERVENTION N/A 04/10/2020   Procedure: CORONARY STENT INTERVENTION;  Surgeon: Wellington Hampshire, MD;  Location: [place] [facility];  Service: Cardiovascular;  Laterality: N/A;  LAD    INTRAVASCULAR ULTRASOUND/IVUS N/A 04/10/2020   Procedure: Intravascular Ultrasound/IVUS;  Surgeon: Wellington Hampshire, MD;  Location: [place] [facility];  Service: Cardiovascular;  Laterality: N/A;  LAD   LEFT HEART CATH AND CORONARY ANGIOGRAPHY N/A 04/01/2020   Procedure: LEFT HEART CATH AND CORONARY ANGIOGRAPHY;  Surgeon: Wellington Hampshire, MD;  Location: [place] [facility];  Service: Cardiovascular;  Laterality: N/A;     Home Meds: Prior to Admission medications   Medication Sig Start Date End Date Taking? Authorizing Provider  acetaminophen (TYLENOL) 325 MG tablet Take 2 tablets (650 mg total) by mouth every 4 (four) hours as needed for headache or mild pain. 04/11/20  Yes Isaiah Serge, NP  Adalimumab 40 MG/0.8ML PNKT Inject 0.4 mLs into the skin every 14 (fourteen) days.    Yes [provider]  amLODipine (NORVASC) 10 MG tablet Take 10 mg by mouth daily.   Yes [provider]  ascorbic acid (VITAMIN C) 1000 MG tablet Take 1,000 mg by mouth daily.    Yes [provider]  aspirin 81 MG tablet Take 81 mg by mouth daily.   Yes [provider]  cetirizine (ZYRTEC) 5 MG tablet Take 10 mg by mouth daily.   Yes [provider]  Cholecalciferol 25 MCG (1000 UT) tablet Take 1,000 Units by mouth daily.    Yes  [provider]  clobetasol cream (TEMOVATE) 9.39 % Apply 1 application topically 2 (two) times daily as needed (Psoriasis).  01/27/20  Yes [provider]  clopidogrel (PLAVIX) 75 MG tablet Take 1 tablet (75 mg total) by mouth daily. 12/17/20  Yes Agbor-Etang, Aaron Edelman, MD  exenatide (BYETTA 10 MCG PEN) 10 MCG/0.04ML SOPN injection Inject 10 mcg into the skin 2 (two) times daily with a meal. 2.4 ml=1 pen 09/04/19  Yes [provider]  Ginkgo Biloba 60 MG CAPS Take 120 mg by mouth daily.   Yes [provider]  glimepiride (AMARYL) 4 MG tablet Take 4 mg by mouth in the morning and at bedtime.   Yes [provider]  KRILL OIL PO Take 325 mg by mouth daily.   Yes [provider]  metFORMIN (GLUCOPHAGE) 1000 MG tablet Take 1 tablet (1,000 mg total) by mouth 2 (two) times daily with a meal. 04/13/20  Yes Isaiah Serge, NP  metoprolol succinate (TOPROL-XL) 25 MG 24 hr tablet Take 1 tablet (25 mg total) by mouth daily. 01/17/21  Yes Agbor-Etang, Aaron Edelman, MD  nitroGLYCERIN (NITROSTAT) 0.4 MG SL  tablet Place 1 tablet (0.4 mg total) under the tongue every 5 (five) minutes as needed for chest pain. 04/11/20  Yes Isaiah Serge, NP  rosuvastatin (CRESTOR) 40 MG tablet Take 1 tablet (40 mg total) by mouth at bedtime. 04/11/20  Yes Isaiah Serge, NP  valsartan (DIOVAN) 320 MG tablet Take 320 mg by mouth daily.   Yes [provider]  vitamin E 180 MG (400 UNITS) capsule Take 400 Units by mouth daily.    Yes [provider]  Zinc 30 MG TABS Take 25 mg by mouth daily.   Yes [provider]    Inpatient Medications: Scheduled Meds:  amLODipine  10 mg Oral Daily   aspirin EC  81 mg Oral Daily   insulin aspart  0-5 Units Subcutaneous QHS   insulin aspart  0-9 Units Subcutaneous TID WC   metFORMIN  1,000 mg Oral Q supper   metoprolol succinate  25 mg Oral Daily   pantoprazole (PROTONIX) IV  40 mg Intravenous Q12H   rosuvastatin  40 mg Oral  QHS   sodium chloride flush  3 mL Intravenous Q12H   Continuous Infusions:  PRN Meds: acetaminophen **OR** acetaminophen, nitroGLYCERIN, ondansetron **OR** ondansetron (ZOFRAN) IV, oxyCODONE, polyethylene glycol, traZODone  Allergies:   Allergies  Allergen Reactions   Atorvastatin     Other reaction(s): Muscle Pain    Social History:   Social History   Socioeconomic History   Marital status: Married    Spouse name: Not on file   Number of children: Not on file   Years of education: Not on file   Highest education level: Not on file  Occupational History   Not on file  Tobacco Use   Smoking status: Former    Years: 30.00    Pack years: 0.00    Types: Cigarettes    Quit date: 2009    Years since quitting: 13.5   Smokeless tobacco: Never  Vaping Use   Vaping Use: Never used  Substance and Sexual Activity   Alcohol use: Yes    Comment: occasional alcohol intake   Drug use: Never   Sexual activity: Not on file  Other Topics Concern   Not on file  Social History Narrative   Not on file   Social Determinants of Health   Financial Resource Strain: Not on file  Food Insecurity: Not on file  Transportation Needs: Not on file  Physical Activity: Not on file  Stress: Not on file  Social Connections: Not on file  Intimate Partner Violence: Not on file     Family History:   Family History  Problem Relation Age of Onset   Dementia Mother    Heart disease Father     ROS:  Review of Systems  Constitutional:  Positive for malaise/fatigue. Negative for chills, diaphoresis, fever and weight loss.  HENT:  Negative for congestion.   Eyes:  Negative for discharge and redness.  Respiratory:  Positive for shortness of breath. Negative for cough, hemoptysis, sputum production and wheezing.   Cardiovascular:  Positive for chest pain and palpitations. Negative for orthopnea, claudication, leg swelling and PND.       Chest pain, SOB, and palpitations are improved following  pRBC x 2.   Gastrointestinal:  Negative for abdominal pain, blood in stool, heartburn, melena, nausea and vomiting.  Genitourinary:  Negative for hematuria.  Musculoskeletal:  Negative for falls and myalgias.  Skin:  Negative for rash.  Neurological:  Positive for weakness. Negative for dizziness,  tingling, tremors, sensory change, speech change, focal weakness and loss of consciousness.  Endo/Heme/Allergies:  Does not bruise/bleed easily.  Psychiatric/Behavioral:  Negative for substance abuse. The patient is not nervous/anxious.   All other systems reviewed and are negative.    Physical Exam/Data:   Vitals:   01/19/21 0029 01/19/21 0108 01/19/21 0333 01/19/21 0354  BP: 115/76 114/78 116/74 104/73  Pulse: 72 69 72 72  Resp: 18 16 17 16   Temp: 98.2 F (36.8 C) 98 F (36.7 C) 98.6 F (37 C) 98.2 F (36.8 C)  TempSrc: Oral Oral  Oral  SpO2: 98% 100% 97% 98%  Weight:    75.2 kg  Height:        Intake/Output Summary (Last 24 hours) at 01/19/2021 0815 Last data filed at 01/19/2021 0354 Gross per 24 hour  Intake 1462 ml  Output 400 ml  Net 1062 ml   Filed Weights   01/18/21 1521 01/19/21 0354  Weight: 75 kg 75.2 kg   Body mass index is 25.97 kg/m.   Physical Exam: General: Well developed, well nourished, in no acute distress. Head: Normocephalic, atraumatic, sclera non-icteric, no xanthomas, pale conjunctiva, nares without discharge.  Neck: Negative for carotid bruits. JVD not elevated. Lungs: Clear bilaterally to auscultation without wheezes, rales, or rhonchi. Breathing is unlabored. Heart: RRR with S1 S2. No murmurs, rubs, or gallops appreciated. Abdomen: Soft, non-tender, non-distended with normoactive bowel sounds. No hepatomegaly. No rebound/guarding. No obvious abdominal masses. Msk:  Strength and tone appear normal for age. Extremities: No clubbing or cyanosis. No edema. Distal pedal pulses are 2+ and equal bilaterally. Neuro: Alert and oriented X 3. No facial  asymmetry. No focal deficit. Moves all extremities spontaneously. Psych:  Responds to questions appropriately with a normal affect.   EKG:  The EKG was personally reviewed and demonstrates: sinus rhythm, 79 bpm, possible prior septal infarct, no acute ST-T changes Telemetry:  Telemetry was personally reviewed and demonstrates: SR  Weights: [employer]   01/18/21 1521 01/19/21 0354  Weight: 75 kg 75.2 kg    Relevant CV Studies:  Lexiscan MPI 12/06/2020: Pharmacological myocardial perfusion imaging study with no significant  ischemia Normal wall motion, EF estimated at 74% No EKG changes concerning for ischemia at peak stress or in recovery. CT attenuation correction images with significant coronary calcification most notably in the LAD Low risk scan __________  ETT 06/2020: Blood pressure demonstrated a normal response to exercise.   Patient able to exercise to stage 3 of Bruce protocol. He denied any chestpain or shortness of breath.   Conclusion: Exercise stress test was optimal for stated age. Negative for inducible ischemia. Normal stress test __________  [facility] 03/2020: Mid LAD lesion is 85% stenosed. 2nd Mrg lesion is 40% stenosed. Prox RCA lesion is 85% stenosed. Ost LAD lesion is 40% stenosed. 2nd Diag lesion is 60% stenosed. Post intervention, there is a 0% residual stenosis. A drug-eluting stent was successfully placed using a STENT RESOLUTE ONYX 3.0X18. Dist LAD-1 lesion is 30% stenosed. Dist LAD-2 lesion is 70% stenosed. Prox LAD lesion is 85% stenosed. Post intervention, there is a 0% residual stenosis. A drug-eluting stent was successfully placed using a STENT RESOLUTE ONYX 4.0X18.   Successful IVUS, angioplasty and drug-eluting stent placement to the mid and proximal LAD using 2 nonoverlapped stents.  Overall difficult procedure due to calcifications.  Intravascular lithotripsy was attempted but had to be aborted due to malfunction in the device which did  not resolve after exchanging the catheter.   Recommendations:  Dual antiplatelet therapy for at least 6 months but preferably long-term as tolerated given his residual disease. The patient requires aggressive medical therapy as he has diffuse calcified disease. __________  2D echo 03/2020: 1. Left ventricular ejection fraction, by estimation, is 65 to 70%. The  left ventricle has normal function. The left ventricle has no regional  wall motion abnormalities. Left ventricular diastolic parameters were  normal.   2. Right ventricular systolic function is normal. The right ventricular  size is normal. Tricuspid regurgitation signal is inadequate for assessing  PA pressure.   3. The mitral valve is normal in structure. No evidence of mitral valve  regurgitation. No evidence of mitral stenosis.   4. The aortic valve is tricuspid. Aortic valve regurgitation is not  visualized. No aortic stenosis is present.   5. The inferior vena cava is normal in size with greater than 50%  respiratory variability, suggesting right atrial pressure of 3 mmHg. __________  LHC 03/2020: Prox RCA lesion is 85% stenosed. 2nd Mrg lesion is 40% stenosed. Ost LAD to Prox LAD lesion is 40% stenosed. Prox LAD lesion is 85% stenosed. Mid LAD lesion is 85% stenosed.   1.  Left dominant coronary arteries with significant one-vessel coronary artery disease involving the LAD.  The LAD has significant proximal and mid disease with significant calcifications.  The ostial LAD has moderate stenosis.  There is also significant proximal RCA stenosis but the vessel is small and nondominant. 2.  Severe catheter induced radial artery spasm with inability to advance the catheter at the left ventricle for left ventricular angiography.   Recommendations: Recommend aggressive medical therapy. Best option for revascularization is likely IVUS guided PCI of the LAD likely with atherectomy or intravascular lithotripsy. We will go ahead  and start the patient on dual antiplatelet therapy and plan for PCI at [facility]. Avoid radial access given severe radial artery spasm. 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 He should get an echocardiogram done first.   Laboratory Data:  Chemistry Recent Labs  Lab 01/17/21 0858 01/18/21 1529 01/19/21 0653  NA 146* 141 140  K 4.9 4.7 4.2  CL 113* 112* 112*  CO2 21 23 25   GLUCOSE 122* 138* 62*  BUN 24 26* 25*  CREATININE 1.81* 1.88* 1.76*  CALCIUM 9.0 8.8* 8.8*  GFRNONAA  --  41* 44*  ANIONGAP  --  6 3*    Recent Labs  Lab 01/18/21 1529  PROT 7.2  ALBUMIN 3.7  AST 29  ALT 17  ALKPHOS 60  BILITOT 0.5   Hematology Recent Labs  Lab 01/17/21 0906 01/18/21 1529 01/18/21 2329  WBC 4.9 6.2  --   RBC 3.17* 2.91*  --   HGB 7.5* 7.1* 7.4*  HCT 24.6* 23.8*  --   MCV 78* 81.8  --   MCH 23.7* 24.4*  --   MCHC 30.5* 29.8*  --   RDW 14.4 14.9  --   PLT 347 307  --    Cardiac EnzymesNo results for input(s): TROPONINI in the last 168 hours. No results for input(s): TROPIPOC in the last 168 hours.  BNPNo results for input(s): BNP, PROBNP in the last 168 hours.  DDimer No results for input(s): DDIMER in the last 168 hours.  Radiology/Studies:  DG Chest 2 View  Result Date: 01/18/2021 IMPRESSION: No acute abnormality of the lungs. Electronically Signed   By: Eddie Candle M.D.   On: 01/18/2021 15:59   US  RENAL  Result Date: 01/18/2021 IMPRESSION: Unremarkable renal ultrasound. Electronically Signed   By: Anner Crete M.D.   On: 01/18/2021 20:38    Assessment and Plan:   1.  Preprocedure cardiac risk stratification: -Patient presented with worsening anemia of uncertain etiology at this time with negative Hemoccult -Possibility he may need to undergo endoscopy during this admission, therefore cardiology has been consulted for risk stratification -Per Revised Cardiac Risk Index, he is low risk for noncardiac procedure -Per [facility]  Activity Status Index, he can achieve >4 METs without cardiac limitation -Patient will need to undergo further evaluation of his anemia for appropriate treatment, therefore he should proceed as indicated per GI at an overall moderate risk -It is quite possible his symptoms of exertional angina and dyspnea were in the setting of his worsening anemia with known underlying CAD -At this time, the patient is not currently a cardiac cath candidate given a 5 g drop in hemoglobin and with AKI  2.  CAD involving the native coronary arteries with stable angina and dyspnea: -Recent Lexiscan MPI showed no evidence of inducible ischemia -Symptoms of angina and dyspnea could possibly be in the setting of his worsening anemia with a 5 g drop of hemoglobin over the past several months -He was previously scheduled for diagnostic LHC on 7/18, however preprocedure labs demonstrated a worsening anemia, therefore he was sent to the ED where he has been admitted -At this time, given his worsening anemia and AKI he is not a cardiac cath candidate without further evaluation and correction of his underlying anemia and AKI -There is the possibility that his symptoms will resolve following treatment of his underlying anemia -Aspirin and clopidogrel were continued upon admission with last dose being PTA on 7/9 -In preparation for potential endoscopy, we will hold clopidogrel and continue ASA as patient is 10 months out from elective PCI  3.  Acute on chronic iron deficiency anemia: -Patient's baseline hemoglobin appears to be around 12 dating back to 03/2020, with a noted continued downward trend in hemoglobin over the past several months -Preprocedure CBC on 7/8 demonstrated a hemoglobin of 7.5 with anemia of uncertain etiology at this time,, possibly GI in etiology -He has received 2 units of PRBC -CBC pending this morning  -IV PPI -We will hold clopidogrel as above and continue ASA given recent PCI x2 -Further work-up  and management per IM and GI  4.  AKI: -Improving -Uncertain etiology -He denies regularly taking NSAIDs -Byetta and valsartan have been held upon admission -He has received IV fluids -Renal ultrasound unrevealing  5.  HTN: -Blood pressure currently well controlled -PTA amlodipine and Toprol  6.  HLD: -PTA Crestor   For questions or updates, please contact [place] Please consult [email or web address] for contact info under Cardiology/STEMI.   Signed, Christell Faith, PA-C [place] Pager: [phone] 01/19/2021, 8:15 AM

## 2021-01-19 NOTE — Plan of Care (Signed)
  Problem: Clinical Measurements: Goal: Cardiovascular complication will be avoided Outcome: Progressing   

## 2021-01-19 NOTE — Progress Notes (Signed)
PROGRESS NOTE    Jesse Ray  BOF:[id number] DOB: 1960-09-11 DOA: 01/18/2021 PCP: Adin Hector, MD    Brief Narrative:  Jesse Ray is a 60 y.o. male with history of CAD status post angioplasty and stent in September 2021, type 2 diabetes, hypertension, hyperlipidemia, psoriatic arthritis, who presents to the ED on the recommendation of his cardiologist after having abnormal blood work done in anticipation of upcoming heart catheterization.CBC showed hemoglobin of 7.1, down from 10.2 on last check in September 2021.  Fecal Hemoccult was negative.   He was consented for transfusion with 2 units packed red blood cells and admitted for further work-up of his AKI and iron deficiency anemia.   Consultants:  GI, cardiology  Procedures:   Antimicrobials:      Subjective: Had black liquidy stool this AM.  No nausea or vomiting, no abdominal pain or dizziness.  Wife at bedside wondering if he is going home today.explained to pts wife, he will need to stay to make sure his cbc is stable in am and will make decision based on overnight observation and labs  Objective: Vitals:   01/19/21 0333 01/19/21 0354 01/19/21 0818 01/19/21 1121  BP: 116/74 104/73 120/78 128/75  Pulse: 72 72 65 68  Resp: 17 16 17 17   Temp: 98.6 F (37 C) 98.2 F (36.8 C) 98.2 F (36.8 C) 97.8 F (36.6 C)  TempSrc:  Oral Oral   SpO2: 97% 98% 96% 99%  Weight:  75.2 kg    Height:        Intake/Output Summary (Last 24 hours) at 01/19/2021 1415 Last data filed at 01/19/2021 1100 Gross per 24 hour  Intake 2042 ml  Output 900 ml  Net 1142 ml   Filed Weights   01/18/21 1521 01/19/21 0354  Weight: 75 kg 75.2 kg    Examination:  General exam: Appears calm and comfortable, wife at bedside Respiratory system: Clear to auscultation. Respiratory effort normal. Cardiovascular system: S1 & S2 heard, RRR. No JVD, murmurs, rubs, gallops or clicks.  Gastrointestinal system: Abdomen is nondistended, soft and  nontender. Normal bowel sounds heard. Central nervous system: Alert and oriented.  Grossly intact Extremities: No edema Skin: Warm dry Psychiatry: Judgement and insight appear normal. Mood & affect appropriate.     Data Reviewed: I have personally reviewed following labs and imaging studies  CBC: Recent Labs  Lab 01/17/21 0906 01/18/21 1529 01/18/21 2329 01/19/21 0653  WBC 4.9 6.2  --  7.4  HGB 7.5* 7.1* 7.4* 9.3*  HCT 24.6* 23.8*  --  29.1*  MCV 78* 81.8  --  80.6  PLT 347 307  --  778   Basic Metabolic Panel: Recent Labs  Lab 01/17/21 0858 01/18/21 1529 01/19/21 0653  NA 146* 141 140  K 4.9 4.7 4.2  CL 113* 112* 112*  CO2 21 23 25   GLUCOSE 122* 138* 62*  BUN 24 26* 25*  CREATININE 1.81* 1.88* 1.76*  CALCIUM 9.0 8.8* 8.8*   GFR: Estimated Creatinine Clearance: 42.8 mL/min (A) (by C-G formula based on SCr of 1.76 mg/dL (H)). Liver Function Tests: Recent Labs  Lab 01/18/21 1529  AST 29  ALT 17  ALKPHOS 60  BILITOT 0.5  PROT 7.2  ALBUMIN 3.7   No results for input(s): LIPASE, AMYLASE in the last 168 hours. No results for input(s): AMMONIA in the last 168 hours. Coagulation Profile: Recent Labs  Lab 01/18/21 1529  INR 1.1   Cardiac Enzymes: No results for input(s): CKTOTAL, CKMB, CKMBINDEX,  TROPONINI in the last 168 hours. BNP (last 3 results) No results for input(s): PROBNP in the last 8760 hours. HbA1C: No results for input(s): HGBA1C in the last 72 hours. CBG: Recent Labs  Lab 01/18/21 2251 01/19/21 0816 01/19/21 1124  GLUCAP 108* 75 165*   Lipid Profile: No results for input(s): CHOL, HDL, LDLCALC, TRIG, CHOLHDL, LDLDIRECT in the last 72 hours. Thyroid Function Tests: No results for input(s): TSH, T4TOTAL, FREET4, T3FREE, THYROIDAB in the last 72 hours. Anemia Panel: Recent Labs    01/18/21 1529  FERRITIN 4*  TIBC 463*  IRON 24*   Sepsis Labs: No results for input(s): PROCALCITON, LATICACIDVEN in the last 168 hours.  Recent  Results (from the past 240 hour(s))  Resp Panel by RT-PCR (Flu A&B, Covid) Nasopharyngeal Swab     Status: None   Collection Time: 01/18/21  4:22 PM   Specimen: Nasopharyngeal Swab; Nasopharyngeal(NP) swabs in vial transport medium  Result Value Ref Range Status   SARS Coronavirus 2 by RT PCR NEGATIVE NEGATIVE Final    Comment: (NOTE) SARS-CoV-2 target nucleic acids are NOT DETECTED.  The SARS-CoV-2 RNA is generally detectable in upper respiratory specimens during the acute phase of infection. The lowest concentration of SARS-CoV-2 viral copies this assay can detect is 138 copies/mL. A negative result does not preclude SARS-Cov-2 infection and should not be used as the sole basis for treatment or other patient management decisions. A negative result may occur with  improper specimen collection/handling, submission of specimen other than nasopharyngeal swab, presence of viral mutation(s) within the areas targeted by this assay, and inadequate number of viral copies(<138 copies/mL). A negative result must be combined with clinical observations, patient history, and epidemiological information. The expected result is Negative.  Fact Sheet for Patients:  EntrepreneurPulse.com.au  Fact Sheet for Healthcare Providers:  IncredibleEmployment.be  This test is no t yet approved or cleared by the [place] FDA and  has been authorized for detection and/or diagnosis of SARS-CoV-2 by FDA under an Emergency Use Authorization (EUA). This EUA will remain  in effect (meaning this test can be used) for the duration of the COVID-19 declaration under Section 564(b)(1) of the Act, 21 U.S.C.section 360bbb-3(b)(1), unless the authorization is terminated  or revoked sooner.       Influenza A by PCR NEGATIVE NEGATIVE Final   Influenza B by PCR NEGATIVE NEGATIVE Final    Comment: (NOTE) The Xpert Xpress SARS-CoV-2/FLU/RSV plus assay is intended as an aid in the  diagnosis of influenza from Nasopharyngeal swab specimens and should not be used as a sole basis for treatment. Nasal washings and aspirates are unacceptable for Xpert Xpress SARS-CoV-2/FLU/RSV testing.  Fact Sheet for Patients: EntrepreneurPulse.com.au  Fact Sheet for Healthcare Providers: IncredibleEmployment.be  This test is not yet approved or cleared by the [place] FDA and has been authorized for detection and/or diagnosis of SARS-CoV-2 by FDA under an Emergency Use Authorization (EUA). This EUA will remain in effect (meaning this test can be used) for the duration of the COVID-19 declaration under Section 564(b)(1) of the Act, 21 U.S.C. section 360bbb-3(b)(1), unless the authorization is terminated or revoked.  Performed at [facility], [address], [place], [place] [address]          Radiology Studies: DG Chest 2 View  Result Date: 01/18/2021 CLINICAL DATA:  Shortness of breath, chest pain EXAM: CHEST - 2 VIEW COMPARISON:  None. FINDINGS: The heart size and mediastinal contours are within normal limits. Both lungs are clear. The  visualized skeletal structures are unremarkable. IMPRESSION: No acute abnormality of the lungs. Electronically Signed   By: Eddie Candle M.D.   On: 01/18/2021 15:59   US RENAL  Result Date: 01/18/2021 CLINICAL DATA:  60 year old male with acute renal insufficiency. EXAM: RENAL / URINARY TRACT ULTRASOUND COMPLETE COMPARISON:  None. FINDINGS: Right Kidney: Renal measurements: 11.0 x 4.9 x 5.4 cm = volume: 152 mL. Echogenicity within normal limits. No mass or hydronephrosis visualized. Left Kidney: Renal measurements: 11.6 x 5.9 x 5.2 cm = volume: 188 mL. Echogenicity within normal limits. No mass or hydronephrosis visualized. Bladder: Appears normal for degree of bladder distention. Bilateral ureteral jets noted. Other: The prostate gland measures 4.0 x 3.7 x 4.2 cm for a volume of 32 cc. IMPRESSION:  Unremarkable renal ultrasound. Electronically Signed   By: Anner Crete M.D.   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 male with history of CAD status post angioplasty and stent in September 2021, type 2 diabetes, hypertension, hyperlipidemia, psoriatic arthritis, who presents to the ED on the recommendation of his cardiologist after having abnormal blood work done in anticipation of upcoming heart catheterization.     #Iron deficiency anemia #Melanotic stools GI consulted. Patient has been on aspirin and Plavix.  Plavix has been held x1 day. Patient remains on aspirin. Will need colonoscopy in 5 days after stopping the Plavix. Will check CBC in a.m. if there is no drop can follow-up for his procedure as outpatient later in the week. Continue IV PPI twice daily Cardiology was consulted for preprocedural restratification-recommended continuing aspirin.  Hold Plavix x5 days it is okay. Status post 2 units packed red blood cell Check a.m. CBC Will check H&H this afternoon   #AKI Unclear etiology and chronicity.  Suspect it is more likely chronic than acute,  given his multiple risk factors (hypertension, diabetes, etc.).   Renal ultrasound unremarkable Holding losartan Continue to monitor  Hypertension- BP on low side.  Holding valsartan.  DC amlodipine. Continue beta-blocker  CAD-c history of PCI with DES September 21  Cardiology following as above  Holding Plavix x5 days for colonoscopy.  Continue aspirin, beta-blockers, and statins  DM2 BG stable Patient refusing R-ISS Continue metformin as this is the only thing he wants to take Holding Byetta given AKI   Psoriatic arthritis-holding adalimumab      DVT prophylaxis: scd- wife asked if he needs to wear this, I explained to them its for dvt ppx. Code Status: Full Family Communication: Wife at bedside Disposition Plan:  Status is: Inpatient  Remains inpatient appropriate because:Inpatient level of care appropriate due to severity of illness  Dispo: The patient is from: Home              Anticipated d/c is to: Home              Patient currently is not medically stable to d/c.   Difficult to place patient No            LOS: 1 day   Time spent: 45 minutes with more than 50% on Wise, MD [facility] Pager [phone]  If 7PM-7AM, please contact night-coverage 01/19/2021, 2:15 PM

## 2021-01-19 NOTE — Consult Note (Signed)
Jesse Lame, MD [facility]  [address], [place], [place] [address] Phone: [phone] Fax : [phone]  Consultation  Referring Provider:     Dr. Dione Plover Primary Care Physician:  Adin Hector, MD Primary Gastroenterologist:  Althia Forts         Reason for Consultation:     Anemia  Date of Admission:  01/18/2021 Date of Consultation:  01/19/2021         HPI:   Jesse Ray is a 60 y.o. male who is being consulted for anemia.  The patient had been sent to the emergency room for abnormal labs and had reported that he was feeling tired and short of breath for the last month. Is reported patient was also having chest tightness with exertion.  The patient had been having some loose bowel movements and thought it may be due to the metformin he was taking.  The patient also has intermittent black tarry stools and was found to have a hemoglobin of 7.5. The hospitalist was called to admit the patient and a GI consultation was initiated. The patient had informed the hospitalist that he had been having chest pain exertion for some time which was worse with exertion and better with rest.  He had a normal EF without any signs of ischemia on a nuclear stress test.  The black tarry stools are reported to have occurred only on 2 episodes in the recent past.  The patient was set up by cardiology for a cardiac catheter due to his symptoms.  2 units of blood were ordered to be transfused. His recent hemoglobins have shown:  Component     Latest Ref Rng & Units 04/11/2020 01/17/2021 01/18/2021 01/18/2021           3:29 PM 11:29 PM  Hemoglobin     13.0 - 17.0 g/dL 10.2 (L) 7.5 (L) 7.1 (L) 7.4 (L)   While his iron studies have also been abnormal and his iron studies from yesterday showed:  Component     Latest Ref Rng & Units 01/18/2021  Iron     45 - 182 ug/dL 24 (L)  TIBC     250 - 450 ug/dL 463 (H)  Saturation Ratios     17.9 - 39.5 % 5 (L)  UIBC     ug/dL 439   The patient had 2 units of  blood and appropriately went up to 9.3 this morning on his hemoglobin.  He is feeling much better today.  He continues to deny any sign of GI bleeding.  Past Medical History:  Diagnosis Date  . Coronary artery disease   . Diabetes mellitus without complication (Jesse Ray)   . HLD (hyperlipidemia) 04/11/2020  . Hyperlipidemia   . Hypertension   . Psoriatic arthritis ([place])   . S/P angioplasty with stent 04/10/30 of proximal and mLAD DES and residual LAD stenosis 04/11/2020    Past Surgical History:  Procedure Laterality Date  . CORONARY STENT INTERVENTION N/A 04/10/2020   Procedure: CORONARY STENT INTERVENTION;  Surgeon: Wellington Hampshire, MD;  Location: [facility];  Service: Cardiovascular;  Laterality: N/A;  LAD   . INTRAVASCULAR ULTRASOUND/IVUS N/A 04/10/2020   Procedure: Intravascular Ultrasound/IVUS;  Surgeon: Wellington Hampshire, MD;  Location: [facility];  Service: Cardiovascular;  Laterality: N/A;  LAD  . LEFT HEART CATH AND CORONARY ANGIOGRAPHY N/A 04/01/2020   Procedure: LEFT HEART CATH AND CORONARY ANGIOGRAPHY;  Surgeon: Wellington Hampshire, MD;  Location: [facility];  Service: Cardiovascular;  Laterality: N/A;    Prior to Admission medications   Medication Sig Start Date End Date Taking? Authorizing Provider  acetaminophen (TYLENOL) 325 MG tablet Take 2 tablets (650 mg total) by mouth every 4 (four) hours as needed for headache or mild pain. 04/11/20  Yes Isaiah Serge, NP  Adalimumab 40 MG/0.8ML PNKT Inject 0.4 mLs into the skin every 14 (fourteen) days.    Yes [provider]  amLODipine (NORVASC) 10 MG tablet Take 10 mg by mouth daily.   Yes [provider]  ascorbic acid (VITAMIN C) 1000 MG tablet Take 1,000 mg by mouth daily.    Yes [provider]  aspirin 81 MG tablet Take 81 mg by mouth daily.   Yes [provider]  cetirizine (ZYRTEC) 5 MG tablet Take 10 mg by mouth daily.   Yes [provider]  Cholecalciferol  25 MCG (1000 UT) tablet Take 1,000 Units by mouth daily.    Yes [provider]  clobetasol cream (TEMOVATE) 9.76 % Apply 1 application topically 2 (two) times daily as needed (Psoriasis).  01/27/20  Yes [provider]  clopidogrel (PLAVIX) 75 MG tablet Take 1 tablet (75 mg total) by mouth daily. 12/17/20  Yes Agbor-Etang, Aaron Edelman, MD  exenatide (BYETTA 10 MCG PEN) 10 MCG/0.04ML SOPN injection Inject 10 mcg into the skin 2 (two) times daily with a meal. 2.4 ml=1 pen 09/04/19  Yes [provider]  Ginkgo Biloba 60 MG CAPS Take 120 mg by mouth daily.   Yes [provider]  glimepiride (AMARYL) 4 MG tablet Take 4 mg by mouth in the morning and at bedtime.   Yes [provider]  KRILL OIL PO Take 325 mg by mouth daily.   Yes [provider]  metFORMIN (GLUCOPHAGE) 1000 MG tablet Take 1 tablet (1,000 mg total) by mouth 2 (two) times daily with a meal. 04/13/20  Yes Isaiah Serge, NP  metoprolol succinate (TOPROL-XL) 25 MG 24 hr tablet Take 1 tablet (25 mg total) by mouth daily. 01/17/21  Yes Agbor-Etang, Aaron Edelman, MD  nitroGLYCERIN (NITROSTAT) 0.4 MG SL tablet Place 1 tablet (0.4 mg total) under the tongue every 5 (five) minutes as needed for chest pain. 04/11/20  Yes Isaiah Serge, NP  rosuvastatin (CRESTOR) 40 MG tablet Take 1 tablet (40 mg total) by mouth at bedtime. 04/11/20  Yes Isaiah Serge, NP  valsartan (DIOVAN) 320 MG tablet Take 320 mg by mouth daily.   Yes [provider]  vitamin E 180 MG (400 UNITS) capsule Take 400 Units by mouth daily.    Yes [provider]  Zinc 30 MG TABS Take 25 mg by mouth daily.   Yes [provider]    History reviewed. No pertinent family history.   Social History   Tobacco Use  . Smoking status: Former    Years: 30.00    Pack years: 0.00    Types: Cigarettes    Quit date: 2009    Years since quitting: 13.5  . Smokeless tobacco: Never  Vaping Use  . Vaping Use: Never used   Substance Use Topics  . Alcohol use: Yes    Comment: occasional alcohol intake  . Drug use: Never    Allergies as of 01/18/2021 - Review Complete 01/18/2021  Allergen Reaction Noted  . Atorvastatin  10/18/2017    Review of Systems:    All systems reviewed and negative except where noted in HPI.   Physical  Exam:  Vital signs in last 24 hours: Temp:  [98 F (36.7 C)-98.6 F (37 C)] 98.2 F (36.8 C) (07/10 0354) Pulse Rate:  [68-90] 72 (07/10 0354) Resp:  [12-19] 16 (07/10 0354) BP: (104-145)/(73-93) 104/73 (07/10 0354) SpO2:  [96 %-100 %] 98 % (07/10 0354) Weight:  [75 kg-75.2 kg] 75.2 kg (07/10 0354) Last BM Date: 01/18/21 General:   Pleasant, cooperative in NAD Head:  Normocephalic and atraumatic. Eyes:   No icterus.   Conjunctiva pink. PERRLA. Ears:  Normal auditory acuity. Neck:  Supple; no masses or thyroidomegaly Lungs: Respirations even and unlabored. Lungs clear to auscultation bilaterally.   No wheezes, crackles, or rhonchi.  Heart:  Regular rate and rhythm;  Without murmur, clicks, rubs or gallops Abdomen:  Soft, nondistended, nontender. Normal bowel sounds. No appreciable masses or hepatomegaly.  No rebound or guarding.  Rectal:  Not performed. Msk:  Symmetrical without gross deformities.    Extremities:  Without edema, cyanosis or clubbing. Neurologic:  Alert and oriented x3;  grossly normal neurologically. Skin:  Intact without significant lesions or rashes. Cervical Nodes:  No significant cervical adenopathy. Psych:  Alert and cooperative. Normal affect.  LAB RESULTS: Recent Labs    01/17/21 0906 01/18/21 1529 01/18/21 2329  WBC 4.9 6.2  --   HGB 7.5* 7.1* 7.4*  HCT 24.6* 23.8*  --   PLT 347 307  --    BMET Recent Labs    01/17/21 0858 01/18/21 1529 01/19/21 0653  NA 146* 141 140  K 4.9 4.7 4.2  CL 113* 112* 112*  CO2 21 23 25   GLUCOSE 122* 138* 62*  BUN 24 26* 25*  CREATININE 1.81* 1.88* 1.76*  CALCIUM 9.0 8.8* 8.8*   LFT Recent Labs     01/18/21 1529  PROT 7.2  ALBUMIN 3.7  AST 29  ALT 17  ALKPHOS 60  BILITOT 0.5   PT/INR Recent Labs    01/18/21 1529  LABPROT 13.7  INR 1.1    STUDIES: DG Chest 2 View  Result Date: 01/18/2021 CLINICAL DATA:  Shortness of breath, chest pain EXAM: CHEST - 2 VIEW COMPARISON:  None. FINDINGS: The heart size and mediastinal contours are within normal limits. Both lungs are clear. The visualized skeletal structures are unremarkable. IMPRESSION: No acute abnormality of the lungs. Electronically Signed   By: Eddie Candle M.D.   On: 01/18/2021 15:59   US RENAL  Result Date: 01/18/2021 CLINICAL DATA:  60 year old male with acute renal insufficiency. EXAM: RENAL / URINARY TRACT ULTRASOUND COMPLETE COMPARISON:  None. FINDINGS: Right Kidney: Renal measurements: 11.0 x 4.9 x 5.4 cm = volume: 152 mL. Echogenicity within normal limits. No mass or hydronephrosis visualized. Left Kidney: Renal measurements: 11.6 x 5.9 x 5.2 cm = volume: 188 mL. Echogenicity within normal limits. No mass or hydronephrosis visualized. Bladder: Appears normal for degree of bladder distention. Bilateral ureteral jets noted. Other: The prostate gland measures 4.0 x 3.7 x 4.2 cm for a volume of 32 cc. IMPRESSION: Unremarkable renal ultrasound. Electronically Signed   By: Anner Crete M.D.   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)   HLD (hyperlipidemia)   S/P angioplasty with stent 04/10/30 of proximal and mLAD DES and residual LAD stenosis  AKI (acute kidney injury) ([place])   Brennon Otterness is a 60 y.o. y/o male with a history of a coronary stent 10 months ago and has been on dual antiplatelet therapy.  He came in with anemia with heme-negative  stools but iron deficiency.  The patient's last colonoscopy was over 10 years ago in [place].  There is no report of any change in bowel habits but he does state that he has been having some weight loss because he has been eating less.  The patient's last dose of Plavix and aspirin was yesterday.  Plan:  The patient is presently only 1 day off of his Plavix.  I have spoken to cardiology who has agreed with stopping the Plavix and keeping the patient on aspirin and do the colonoscopy in 5 days after stopping the Plavix.  The patient should have a hemoglobin checked today and if kept overnight again tomorrow morning and if there is no drop then he should follow-up for his procedures as an outpatient later this week.  I have informed my office to give him a call upon discharge so that we can set this up.  Thank you for involving me in the care of this patient.      LOS: 1 day   Jesse Lame, MD, [facility] 01/19/2021, 7:55 AM,  Pager [phone] 7am-5pm  Check AMION for 5pm -7am coverage and on weekends   Note: This dictation was prepared with Dragon dictation along with smaller phrase technology. Any transcriptional errors that result from this process are unintentional.

## 2021-01-20 ENCOUNTER — Telehealth: Payer: Self-pay | Admitting: Cardiology

## 2021-01-20 ENCOUNTER — Other Ambulatory Visit: Payer: Self-pay

## 2021-01-20 DIAGNOSIS — D500 Iron deficiency anemia secondary to blood loss (chronic): Secondary | ICD-10-CM

## 2021-01-20 LAB — BPAM RBC
Blood Product Expiration Date: 202208092359
Blood Product Expiration Date: 202208112359
ISSUE DATE / TIME: 202207091738
ISSUE DATE / TIME: 202207100047
Unit Type and Rh: 5100
Unit Type and Rh: 5100

## 2021-01-20 LAB — CBC
HCT: 29.1 % — ABNORMAL LOW (ref 39.0–52.0)
Hemoglobin: 9.2 g/dL — ABNORMAL LOW (ref 13.0–17.0)
MCH: 25.8 pg — ABNORMAL LOW (ref 26.0–34.0)
MCHC: 31.6 g/dL (ref 30.0–36.0)
MCV: 81.7 fL (ref 80.0–100.0)
Platelets: 267 10*3/uL (ref 150–400)
RBC: 3.56 MIL/uL — ABNORMAL LOW (ref 4.22–5.81)
RDW: 15.9 % — ABNORMAL HIGH (ref 11.5–15.5)
WBC: 7.8 10*3/uL (ref 4.0–10.5)
nRBC: 0 % (ref 0.0–0.2)

## 2021-01-20 LAB — TYPE AND SCREEN
ABO/RH(D): O POS
Antibody Screen: NEGATIVE
Unit division: 0
Unit division: 0

## 2021-01-20 LAB — BASIC METABOLIC PANEL
Anion gap: 4 — ABNORMAL LOW (ref 5–15)
BUN: 28 mg/dL — ABNORMAL HIGH (ref 6–20)
CO2: 26 mmol/L (ref 22–32)
Calcium: 9.1 mg/dL (ref 8.9–10.3)
Chloride: 111 mmol/L (ref 98–111)
Creatinine, Ser: 1.78 mg/dL — ABNORMAL HIGH (ref 0.61–1.24)
GFR, Estimated: 44 mL/min — ABNORMAL LOW (ref >60)
Glucose, Bld: 163 mg/dL — ABNORMAL HIGH (ref 70–99)
Potassium: 4.2 mmol/L (ref 3.5–5.1)
Sodium: 141 mmol/L (ref 135–145)

## 2021-01-20 LAB — GLUCOSE, CAPILLARY
Glucose-Capillary: 183 mg/dL — ABNORMAL HIGH (ref 70–99)
Glucose-Capillary: 217 mg/dL — ABNORMAL HIGH (ref 70–99)

## 2021-01-20 MED ORDER — SUPREP BOWEL PREP KIT 17.5-3.13-1.6 GM/177ML PO SOLN: 1.0000 | ORAL | 0 refills | Status: DC

## 2021-01-20 MED ORDER — PANTOPRAZOLE SODIUM 40 MG PO TBEC: 40.0000 mg | DELAYED_RELEASE_TABLET | Freq: Two times a day (BID) | ORAL | 0 refills | Status: DC

## 2021-01-20 NOTE — Progress Notes (Signed)
Progress Note  Patient Name: Jesse Ray Date of Encounter: 01/20/2021  [facility] Cardiologist: Kate Sable, MD   Subjective   Hgb/Hct stable this am 9.2/29.1. Plavix held x 2 days. Plan for colonoscopy/EGD Thursday. Patient is feeling better s/p 2 units PRBCs.  Inpatient Medications    Scheduled Meds:  aspirin EC  81 mg Oral Daily   insulin aspart  0-5 Units Subcutaneous QHS   insulin aspart  0-9 Units Subcutaneous TID WC   metFORMIN  1,000 mg Oral Q supper   metoprolol succinate  25 mg Oral Daily   pantoprazole (PROTONIX) IV  40 mg Intravenous Q12H   rosuvastatin  40 mg Oral QHS   sodium chloride flush  3 mL Intravenous Q12H   Continuous Infusions:  PRN Meds: acetaminophen **OR** acetaminophen, nitroGLYCERIN, ondansetron **OR** ondansetron (ZOFRAN) IV, oxyCODONE, polyethylene glycol, traZODone   Vital Signs    Vitals:   01/19/21 1416 01/19/21 1947 01/20/21 0412 01/20/21 0727  BP: 131/74 126/76 121/80 127/80  Pulse: 76 73 70 69  Resp: 17 18 18 16   Temp: 98 F (36.7 C) 98.9 F (37.2 C) 98.2 F (36.8 C) 97.6 F (36.4 C)  TempSrc:  Oral Oral Oral  SpO2: 95% 97% 97% 96%  Weight:      Height:        Intake/Output Summary (Last 24 hours) at 01/20/2021 0935 Last data filed at 01/19/2021 1100 Gross per 24 hour  Intake 240 ml  Output --  Net 240 ml   Last 3 Weights 01/19/2021 01/18/2021 01/17/2021  Weight (lbs) 165 lb 12.8 oz 165 lb 5.5 oz 167 lb  Weight (kg) 75.206 kg 75 kg 75.751 kg      Telemetry    NSR, HR 60-70s - Personally Reviewed  ECG    No new - Personally Reviewed  Physical Exam   GEN: No acute distress.   Neck: No JVD Cardiac: RRR, no murmurs, rubs, or gallops.  Respiratory: Clear to auscultation bilaterally. GI: Soft, nontender, non-distended  MS: No edema; No deformity. Neuro:  Nonfocal  Psych: Normal affect   Labs    High Sensitivity Troponin:   Recent Labs  Lab 01/18/21 1529 01/18/21 1755  TROPONINIHS 5 5       Chemistry Recent Labs  Lab 01/18/21 1529 01/19/21 0653 01/20/21 0401  NA 141 140 141  K 4.7 4.2 4.2  CL 112* 112* 111  CO2 23 25 26   GLUCOSE 138* 62* 163*  BUN 26* 25* 28*  CREATININE 1.88* 1.76* 1.78*  CALCIUM 8.8* 8.8* 9.1  PROT 7.2  --   --   ALBUMIN 3.7  --   --   AST 29  --   --   ALT 17  --   --   ALKPHOS 60  --   --   BILITOT 0.5  --   --   GFRNONAA 41* 44* 44*  ANIONGAP 6 3* 4*     Hematology Recent Labs  Lab 01/18/21 1529 01/18/21 2329 01/19/21 0653 01/19/21 1452 01/20/21 0401  WBC 6.2  --  7.4  --  7.8  RBC 2.91*  --  3.61*  --  3.56*  HGB 7.1*   < > 9.3* 8.8* 9.2*  HCT 23.8*  --  29.1* 27.2* 29.1*  MCV 81.8  --  80.6  --  81.7  MCH 24.4*  --  25.8*  --  25.8*  MCHC 29.8*  --  32.0  --  31.6  RDW 14.9  --  15.8*  --  15.9*  PLT 307  --  281  --  267   < > = values in this interval not displayed.    BNPNo results for input(s): BNP, PROBNP in the last 168 hours.   DDimer No results for input(s): DDIMER in the last 168 hours.   Radiology    DG Chest 2 View  Result Date: 01/18/2021 CLINICAL DATA:  Shortness of breath, chest pain EXAM: CHEST - 2 VIEW COMPARISON:  None. FINDINGS: The heart size and mediastinal contours are within normal limits. Both lungs are clear. The visualized skeletal structures are unremarkable. IMPRESSION: No acute abnormality of the lungs. Electronically Signed   By: Eddie Candle M.D.   On: 01/18/2021 15:59   US RENAL  Result Date: 01/18/2021 CLINICAL DATA:  60 year old male with acute renal insufficiency. EXAM: RENAL / URINARY TRACT ULTRASOUND COMPLETE COMPARISON:  None. FINDINGS: Right Kidney: Renal measurements: 11.0 x 4.9 x 5.4 cm = volume: 152 mL. Echogenicity within normal limits. No mass or hydronephrosis visualized. Left Kidney: Renal measurements: 11.6 x 5.9 x 5.2 cm = volume: 188 mL. Echogenicity within normal limits. No mass or hydronephrosis visualized. Bladder: Appears normal for degree of bladder distention.  Bilateral ureteral jets noted. Other: The prostate gland measures 4.0 x 3.7 x 4.2 cm for a volume of 32 cc. IMPRESSION: Unremarkable renal ultrasound. Electronically Signed   By: Anner Crete M.D.   On: 01/18/2021 20:38    Cardiac Studies   Cardiac cath 04/10/20 CORONARY STENT INTERVENTION  Intravascular Ultrasound/IVUS    Conclusion    Mid LAD lesion is 85% stenosed. 2nd Mrg lesion is 40% stenosed. Prox RCA lesion is 85% stenosed. Ost LAD lesion is 40% stenosed. 2nd Diag lesion is 60% stenosed. Post intervention, there is a 0% residual stenosis. A drug-eluting stent was successfully placed using a STENT RESOLUTE ONYX 3.0X18. Dist LAD-1 lesion is 30% stenosed. Dist LAD-2 lesion is 70% stenosed. Prox LAD lesion is 85% stenosed. Post intervention, there is a 0% residual stenosis. A drug-eluting stent was successfully placed using a STENT RESOLUTE ONYX 4.0X18.   Successful IVUS, angioplasty and drug-eluting stent placement to the mid and proximal LAD using 2 nonoverlapped stents.  Overall difficult procedure due to calcifications.  Intravascular lithotripsy was attempted but had to be aborted due to malfunction in the device which did not resolve after exchanging the catheter.   Recommendations: Dual antiplatelet therapy for at least 6 months but preferably long-term as tolerated given his residual disease. The patient requires aggressive medical therapy as he has diffuse calcified disease.   Echo 04/09/20  1. Left ventricular ejection fraction, by estimation, is 65 to 70%. The  left ventricle has normal function. The left ventricle has no regional  wall motion abnormalities. Left ventricular diastolic parameters were  normal.   2. Right ventricular systolic function is normal. The right ventricular  size is normal. Tricuspid regurgitation signal is inadequate for assessing  PA pressure.   3. The mitral valve is normal in structure. No evidence of mitral valve  regurgitation.  No evidence of mitral stenosis.   4. The aortic valve is tricuspid. Aortic valve regurgitation is not  visualized. No aortic stenosis is present.   5. The inferior vena cava is normal in size with greater than 50%  respiratory variability, suggesting right atrial pressure of 3 mmHg.   Patient Profile     60 y.o. male with h/o CAD s/p PCI/DESx2 to the LAD in 03/2020, DM2 with diabetic nephropathy, family h/o premature CAD  in his father with MI at 28 who is being seen for preprocedural risk stratification for EGD/colonoscopy.  Assessment & Plan   Acute on chronic iron deficiency Anemia s/p 2 units PRBCs - Baseline Hgb around 12.  - Hgb found to be 7.5 per pre-procedural labs - Plavix held, ASA continued given DES/PCI in September 2021 - GI planning on procedure Thursday - Pre-procedural evaluation>> METS>4 and no further cardiac testing prior to procedure - IV PPI - daily CBC  CAD s/p PCI/DES x2 to the LAD 03/2020 - recent Lexiscan showed no evidence of inducible ischemia. He had worsening DOE and was set up for cath. Suspect DOE may be from anemia with underlying CAD - LHC was scheduled for 7/19, would wait given drop in Hgb, suspect symptoms will improve with treatment of anemia. Also with AKI - ASA continue, Plavix held. Plan to restart if Hgb remains stable. Per MD  AKI -Byetta and valsartan held on admission - s/p IVF - improving  HTN - PTA amlodipine and Toprol - bp normotensive   For questions or updates, please contact [place] Please consult [email or web address] for contact info under        Signed, Mallori Araque Ninfa Meeker, PA-C  01/20/2021, 9:35 AM

## 2021-01-20 NOTE — Discharge Summary (Signed)
Jesse Ray VOZ:[id number] DOB: 1960-09-05 DOA: 01/18/2021  PCP: Adin Hector, MD  Admit date: 01/18/2021 Discharge date: 01/20/2021  Admitted From: home Disposition:  home  Recommendations for Outpatient Follow-up:  Follow up with PCP in 1 week Please obtain BMP/CBC in one week Please follow up with Dr. Allen Norris this week    Discharge Condition:Stable CODE STATUS:full Diet recommendation: Heart Healthy / Carb Modified  Brief/Interim Summary: Per QQV:ZDGLOVF Cefalu is a 60 y.o. male with history of CAD status post angioplasty and stent in September 2021, type 2 diabetes, hypertension, hyperlipidemia, psoriatic arthritis, who presents to the ED on the recommendation of his cardiologist after having abnormal blood work done in anticipation of upcoming heart catheterization.Unexpectedly his blood work showed a worsening of his anemia with hemoglobin of 7.5 as well as a worsening of his creatinine to 1.8, he called his cardiologist this morning to review these labs and was instructed to come to the ED for further work-up.  Repeat lab work continued to show AKI with creatinine of 1.8 above his baseline of 1.2, remainder of CMP unremarkable.  CBC showed hemoglobin of 7.1, down from 10.2 on last check in September 2021.  Fecal Hemoccult was negative.  Troponin was normal.  #Iron deficiency anemia #Melanotic stools GI consulted. Cardiology also consulted for preop risk stratification and management of DAPT. Patient was transfused 2 units PRBC. Cardiology was consulted for preprocedural restratification-recommended continuing aspirin.  Hold Plavix x5 days it is okay. Cardiology felt patient overall is moderate risk for planned procedure GI and cardiology was okay to continue aspirin therapy.  His hemoglobin has remained stable since the transfusion.  This a.m. was also stable. Continue PPI as outpatient Will need to follow-up with Dr. Allen Norris later this week for planned GI work-up/scope    #AKI Unclear etiology and chronicity.  Suspect it is more likely chronic than acute, given his multiple risk factors (hypertension, diabetes, etc.).   Renal ultrasound unremarkable Can continue medications.  Will need to follow-up with primary care for closer evaluation and monitoring.   Hypertension- Stable   CAD-history of PCI with DES September 2 021 Cardiology following as above Holding Plavix x5 days for colonoscopy.   Continue aspirin, beta-blockers, and statins Due to possible GI bleed cardiac cath has been deferred until further GI work-up. When cleared by GI but will need cardiology's input as outpatient with Dr. Aletha Halim for timing.   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)   Coronary artery disease of native artery of native heart with stable angina pectoris ([place])   HLD (hyperlipidemia)   S/P angioplasty with stent 04/10/30 of proximal and mLAD DES and residual LAD stenosis   AKI (acute kidney injury) ([place])   Symptomatic anemia    Discharge Instructions  Discharge Instructions     Call MD for:  difficulty breathing, headache or visual disturbances   Complete by: As directed    Diet - low sodium heart healthy   Complete by: As directed    Diet Carb Modified   Complete by: As directed    Discharge instructions   Complete by: As directed    Follow up with Dr. Allen Norris this week, call his office.  Do not take plavix until colonoscopy. Take Enteric coated aspirin   Increase activity slowly   Complete  by: As directed       Allergies as of 01/20/2021       Reactions   Atorvastatin    Other reaction(s): Muscle Pain        Medication List     STOP taking these medications    amLODipine 10 MG tablet Commonly known as: NORVASC    clopidogrel 75 MG tablet Commonly known as: PLAVIX   Ginkgo Biloba 60 MG Caps   KRILL OIL PO   valsartan 320 MG tablet Commonly known as: DIOVAN       TAKE these medications    acetaminophen 325 MG tablet Commonly known as: TYLENOL Take 2 tablets (650 mg total) by mouth every 4 (four) hours as needed for headache or mild pain.   Adalimumab 40 MG/0.8ML Pnkt Inject 0.4 mLs into the skin every 14 (fourteen) days.   ascorbic acid 1000 MG tablet Commonly known as: VITAMIN C Take 1,000 mg by mouth daily.   aspirin 81 MG tablet Take 81 mg by mouth daily.   Byetta 10 MCG Pen 10 MCG/0.04ML Sopn injection Generic drug: exenatide Inject 10 mcg into the skin 2 (two) times daily with a meal. 2.4 ml=1 pen   cetirizine 5 MG tablet Commonly known as: ZYRTEC Take 10 mg by mouth daily.   Cholecalciferol 25 MCG (1000 UT) tablet Take 1,000 Units by mouth daily.   clobetasol cream 0.05 % Commonly known as: TEMOVATE Apply 1 application topically 2 (two) times daily as needed (Psoriasis).   glimepiride 4 MG tablet Commonly known as: AMARYL Take 4 mg by mouth in the morning and at bedtime.   metFORMIN 1000 MG tablet Commonly known as: GLUCOPHAGE Take 1 tablet (1,000 mg total) by mouth 2 (two) times daily with a meal.   metoprolol succinate 25 MG 24 hr tablet Commonly known as: TOPROL-XL Take 1 tablet (25 mg total) by mouth daily.   nitroGLYCERIN 0.4 MG SL tablet Commonly known as: NITROSTAT Place 1 tablet (0.4 mg total) under the tongue every 5 (five) minutes as needed for chest pain.   rosuvastatin 40 MG tablet Commonly known as: CRESTOR Take 1 tablet (40 mg total) by mouth at bedtime.   vitamin E 180 MG (400 UNITS) capsule Take 400 Units by mouth daily.   Zinc 30 MG Tabs Take 25 mg by mouth daily.        Follow-up Information     [name], MD Follow up.   Specialty: Gastroenterology Contact information: [place]  [id number] [phone]         Tama High III, MD Follow up in 1 week(s).   Specialty: Internal Medicine Contact information: [place] [address] [phone]                Allergies  Allergen Reactions   Atorvastatin     Other reaction(s): Muscle Pain    Consultations: GI, cardiology   Procedures/Studies: DG Chest 2 View  Result Date: 01/18/2021 CLINICAL DATA:  Shortness of breath, chest pain EXAM: CHEST - 2 VIEW COMPARISON:  None. FINDINGS: The heart size and mediastinal contours are within normal limits. Both lungs are clear. The visualized skeletal structures are unremarkable. IMPRESSION: No acute abnormality of the lungs. Electronically Signed   By: Eddie Candle M.D.   On: 01/18/2021 15:59   US RENAL  Result Date: 01/18/2021 CLINICAL DATA:  60 year old male with acute renal insufficiency. EXAM: RENAL / URINARY TRACT ULTRASOUND COMPLETE COMPARISON:  None. FINDINGS: Right Kidney: Renal measurements: 11.0 x 4.9 x 5.4 cm = volume: 152 mL. Echogenicity within normal limits. No mass or hydronephrosis visualized. Left Kidney: Renal measurements: 11.6 x 5.9 x 5.2 cm = volume: 188 mL. Echogenicity within normal limits. No mass or hydronephrosis visualized. Bladder: Appears normal for degree of bladder distention. Bilateral ureteral jets noted. Other: The prostate gland measures 4.0 x 3.7 x 4.2 cm for a volume of 32 cc. IMPRESSION: Unremarkable renal ultrasound. Electronically Signed   By: Anner Crete M.D.   On: 01/18/2021 20:38      Subjective: No cp, sob , no further bleed noticed  Discharge Exam: Vitals:   01/20/21 0412 01/20/21 0727  BP: 121/80 127/80  Pulse: 70 69  Resp: 18 16  Temp: 98.2 F (36.8 C) 97.6 F (36.4 C)  SpO2: 97% 96%   Vitals:   01/19/21 1416 01/19/21 1947 01/20/21 0412 01/20/21 0727  BP: 131/74 126/76 121/80 127/80  Pulse: 76 73 70 69  Resp: 17 18 18 16   Temp: 98 F (36.7 C) 98.9 F (37.2  C) 98.2 F (36.8 C) 97.6 F (36.4 C)  TempSrc:  Oral Oral Oral  SpO2: 95% 97% 97% 96%  Weight:      Height:        General: Pt is alert, awake, not in acute distress Cardiovascular: RRR, S1/S2 +, no rubs, no gallops Respiratory: CTA bilaterally, no wheezing, no rhonchi Abdominal: Soft, NT, ND, bowel sounds + Extremities: no edema, no cyanosis    The results of significant diagnostics from this hospitalization (including imaging, microbiology, ancillary and laboratory) are listed below for reference.     Microbiology: Recent Results (from the past 240 hour(s))  Resp Panel by RT-PCR (Flu A&B, Covid) Nasopharyngeal Swab     Status: None   Collection Time: 01/18/21  4:22 PM   Specimen: Nasopharyngeal Swab; Nasopharyngeal(NP) swabs in vial transport medium  Result Value Ref Range Status   SARS Coronavirus 2 by RT PCR NEGATIVE NEGATIVE Final    Comment: (NOTE) SARS-CoV-2 target nucleic acids are NOT DETECTED.  The SARS-CoV-2 RNA is generally detectable in upper respiratory specimens during the acute phase of infection. The lowest concentration of SARS-CoV-2 viral copies this assay can detect is 138 copies/mL. A negative result does not preclude SARS-Cov-2 infection and should not be used as the sole basis for treatment or other patient management decisions. A negative result may occur with  improper specimen collection/handling, submission of specimen other than nasopharyngeal swab, presence of viral mutation(s) within the areas targeted by this assay, and inadequate number of viral copies(<138 copies/mL). A negative result must be combined with clinical observations, patient history, and epidemiological information. The expected result is Negative.  Fact Sheet for Patients:  EntrepreneurPulse.com.au  Fact Sheet for Healthcare Providers:  IncredibleEmployment.be  This test is no t yet approved or cleared by the [place] FDA and  has  been authorized for detection and/or diagnosis of SARS-CoV-2 by FDA under an Emergency Use Authorization (EUA). This EUA will remain  in effect (meaning this test can be used) for the duration of the COVID-19 declaration under Section 564(b)(1) of the Act, 21 U.S.C.section 360bbb-3(b)(1), unless the authorization is terminated  or revoked sooner.       Influenza A by PCR NEGATIVE NEGATIVE Final   Influenza B by PCR NEGATIVE NEGATIVE Final    Comment: (NOTE) The Xpert Xpress SARS-CoV-2/FLU/RSV plus assay is intended as an aid in the diagnosis of influenza from Nasopharyngeal swab specimens  and should not be used as a sole basis for treatment. Nasal washings and aspirates are unacceptable for Xpert Xpress SARS-CoV-2/FLU/RSV testing.  Fact Sheet for Patients: EntrepreneurPulse.com.au  Fact Sheet for Healthcare Providers: IncredibleEmployment.be  This test is not yet approved or cleared by the [place] FDA and has been authorized for detection and/or diagnosis of SARS-CoV-2 by FDA under an Emergency Use Authorization (EUA). This EUA will remain in effect (meaning this test can be used) for the duration of the COVID-19 declaration under Section 564(b)(1) of the Act, 21 U.S.C. section 360bbb-3(b)(1), unless the authorization is terminated or revoked.  Performed at [facility], [address] [place], [place] [address]      Labs: BNP (last 3 results) No results for input(s): BNP in the last 8760 hours. Basic Metabolic Panel: Recent Labs  Lab 01/17/21 0858 01/18/21 1529 01/19/21 0653 01/20/21 0401  NA 146* 141 140 141  K 4.9 4.7 4.2 4.2  CL 113* 112* 112* 111  CO2 21 23 25 26   GLUCOSE 122* 138* 62* 163*  BUN 24 26* 25* 28*  CREATININE 1.81* 1.88* 1.76* 1.78*  CALCIUM 9.0 8.8* 8.8* 9.1   Liver Function Tests: Recent Labs  Lab 01/18/21 1529  AST 29  ALT 17  ALKPHOS 60  BILITOT 0.5  PROT 7.2  ALBUMIN 3.7   No  results for input(s): LIPASE, AMYLASE in the last 168 hours. No results for input(s): AMMONIA in the last 168 hours. CBC: Recent Labs  Lab 01/17/21 0906 01/18/21 1529 01/18/21 2329 01/19/21 0653 01/19/21 1452 01/20/21 0401  WBC 4.9 6.2  --  7.4  --  7.8  HGB 7.5* 7.1* 7.4* 9.3* 8.8* 9.2*  HCT 24.6* 23.8*  --  29.1* 27.2* 29.1*  MCV 78* 81.8  --  80.6  --  81.7  PLT 347 307  --  281  --  267   Cardiac Enzymes: No results for input(s): CKTOTAL, CKMB, CKMBINDEX, TROPONINI in the last 168 hours. BNP: Invalid input(s): POCBNP CBG: Recent Labs  Lab 01/19/21 1124 01/19/21 1610 01/19/21 2057 01/19/21 2150 01/20/21 0745  GLUCAP 165* 276* 59* 115* 183*   D-Dimer No results for input(s): DDIMER in the last 72 hours. Hgb A1c No results for input(s): HGBA1C in the last 72 hours. Lipid Profile No results for input(s): CHOL, HDL, LDLCALC, TRIG, CHOLHDL, LDLDIRECT in the last 72 hours. Thyroid function studies No results for input(s): TSH, T4TOTAL, T3FREE, THYROIDAB in the last 72 hours.  Invalid input(s): FREET3 Anemia work up Recent Labs    01/18/21 1529  FERRITIN 4*  TIBC 463*  IRON 24*   Urinalysis    Component Value Date/Time   COLORURINE YELLOW (A) 01/18/2021 1755   APPEARANCEUR CLEAR (A) 01/18/2021 1755   LABSPEC 1.013 01/18/2021 1755   PHURINE 6.0 01/18/2021 1755   GLUCOSEU NEGATIVE 01/18/2021 1755   HGBUR NEGATIVE 01/18/2021 1755   [place] NEGATIVE 01/18/2021 1755   [place] 01/18/2021 1755   PROTEINUR 30 (A) 01/18/2021 1755   NITRITE NEGATIVE 01/18/2021 1755   LEUKOCYTESUR NEGATIVE 01/18/2021 1755   Sepsis Labs Invalid input(s): PROCALCITONIN,  WBC,  LACTICIDVEN Microbiology Recent Results (from the past 240 hour(s))  Resp Panel by RT-PCR (Flu A&B, Covid) Nasopharyngeal Swab     Status: None   Collection Time: 01/18/21  4:22 PM   Specimen: Nasopharyngeal Swab; Nasopharyngeal(NP) swabs in vial transport medium  Result Value Ref Range  Status   SARS Coronavirus 2 by RT PCR NEGATIVE NEGATIVE Final  Comment: (NOTE) SARS-CoV-2 target nucleic acids are NOT DETECTED.  The SARS-CoV-2 RNA is generally detectable in upper respiratory specimens during the acute phase of infection. The lowest concentration of SARS-CoV-2 viral copies this assay can detect is 138 copies/mL. A negative result does not preclude SARS-Cov-2 infection and should not be used as the sole basis for treatment or other patient management decisions. A negative result may occur with  improper specimen collection/handling, submission of specimen other than nasopharyngeal swab, presence of viral mutation(s) within the areas targeted by this assay, and inadequate number of viral copies(<138 copies/mL). A negative result must be combined with clinical observations, patient history, and epidemiological information. The expected result is Negative.  Fact Sheet for Patients:  EntrepreneurPulse.com.au  Fact Sheet for Healthcare Providers:  IncredibleEmployment.be  This test is no t yet approved or cleared by the [place] FDA and  has been authorized for detection and/or diagnosis of SARS-CoV-2 by FDA under an Emergency Use Authorization (EUA). This EUA will remain  in effect (meaning this test can be used) for the duration of the COVID-19 declaration under Section 564(b)(1) of the Act, 21 U.S.C.section 360bbb-3(b)(1), unless the authorization is terminated  or revoked sooner.       Influenza A by PCR NEGATIVE NEGATIVE Final   Influenza B by PCR NEGATIVE NEGATIVE Final    Comment: (NOTE) The Xpert Xpress SARS-CoV-2/FLU/RSV plus assay is intended as an aid in the diagnosis of influenza from Nasopharyngeal swab specimens and should not be used as a sole basis for treatment. Nasal washings and aspirates are unacceptable for Xpert Xpress SARS-CoV-2/FLU/RSV testing.  Fact Sheet for  Patients: EntrepreneurPulse.com.au  Fact Sheet for Healthcare Providers: IncredibleEmployment.be  This test is not yet approved or cleared by the [place] FDA and has been authorized for detection and/or diagnosis of SARS-CoV-2 by FDA under an Emergency Use Authorization (EUA). This EUA will remain in effect (meaning this test can be used) for the duration of the COVID-19 declaration under Section 564(b)(1) of the Act, 21 U.S.C. section 360bbb-3(b)(1), unless the authorization is terminated or revoked.  Performed at [facility], [address] [place], [place] [address]      Time coordinating discharge: Over 30 minutes  SIGNED:   Nolberto Hanlon, MD  [facility] 01/20/2021, 10:24 AM Pager   If 7PM-7AM, please contact night-coverage [email or web address] Password [email or web address]

## 2021-01-20 NOTE — Progress Notes (Signed)
   01/20/21 1200  Clinical Encounter Type  Visited With Patient and family together  Visit Type Initial  Referral From Other (Comment) (rounding)  Spiritual Encounters  Spiritual Needs  (none specified)  Chaplain Burris met briefly with PT and spouse. PT was excited because anticipating discharge. Chaplain Burris offered general support and words encouragement and wishes for continued healing.

## 2021-01-20 NOTE — Progress Notes (Addendum)
Patient discharged to home wheeled out of unit accompanied by Care RN and wife with all belongings.  VSS.  A+Ox4.  Steady gait.  Discharge and medications reviewed with patient.  All questions answered.  PIV x2 removed.  No bleeding, intact.  Patient verbalized understanding of signs and symptoms of infection. Patient agreed to follow up with all appointments as listed on AVS.  Patient satisfied with overall care at [facility].

## 2021-01-20 NOTE — Plan of Care (Signed)

## 2021-01-20 NOTE — Plan of Care (Signed)
  Problem: Education: Goal: Knowledge of General Education information will improve Description: Including pain rating scale, medication(s)/side effects and non-pharmacologic comfort measures 01/20/2021 0952 by Orvan Seen, RN Outcome: Completed/Met 01/20/2021 0809 by Orvan Seen, RN Outcome: Progressing   Problem: Health Behavior/Discharge Planning: Goal: Ability to manage health-related needs will improve 01/20/2021 0952 by Orvan Seen, RN Outcome: Completed/Met 01/20/2021 0809 by Orvan Seen, RN Outcome: Progressing   Problem: Clinical Measurements: Goal: Ability to maintain clinical measurements within normal limits will improve 01/20/2021 0952 by Orvan Seen, RN Outcome: Completed/Met 01/20/2021 0809 by Orvan Seen, RN Outcome: Progressing Goal: Will remain free from infection 01/20/2021 0952 by Orvan Seen, RN Outcome: Completed/Met 01/20/2021 0809 by Orvan Seen, RN Outcome: Progressing Goal: Diagnostic test results will improve 01/20/2021 0952 by Orvan Seen, RN Outcome: Completed/Met 01/20/2021 0809 by Orvan Seen, RN Outcome: Progressing Goal: Respiratory complications will improve 01/20/2021 0952 by Orvan Seen, RN Outcome: Completed/Met 01/20/2021 0809 by Orvan Seen, RN Outcome: Progressing Goal: Cardiovascular complication will be avoided 01/20/2021 0952 by Orvan Seen, RN Outcome: Completed/Met 01/20/2021 0809 by Orvan Seen, RN Outcome: Progressing   Problem: Activity: Goal: Risk for activity intolerance will decrease 01/20/2021 0952 by Orvan Seen, RN Outcome: Completed/Met 01/20/2021 0809 by Orvan Seen, RN Outcome: Progressing   Problem: Nutrition: Goal: Adequate nutrition will be maintained 01/20/2021 0952 by Orvan Seen, RN Outcome: Completed/Met 01/20/2021 0809 by Orvan Seen, RN Outcome: Progressing   Problem: Coping: Goal: Level of anxiety will decrease 01/20/2021  0952 by Orvan Seen, RN Outcome: Completed/Met 01/20/2021 0809 by Orvan Seen, RN Outcome: Progressing   Problem: Elimination: Goal: Will not experience complications related to bowel motility 01/20/2021 0952 by Orvan Seen, RN Outcome: Completed/Met 01/20/2021 0809 by Orvan Seen, RN Outcome: Progressing Goal: Will not experience complications related to urinary retention 01/20/2021 0952 by Orvan Seen, RN Outcome: Completed/Met 01/20/2021 0809 by Orvan Seen, RN Outcome: Progressing   Problem: Pain Managment: Goal: General experience of comfort will improve 01/20/2021 0952 by Orvan Seen, RN Outcome: Completed/Met 01/20/2021 0809 by Orvan Seen, RN Outcome: Progressing   Problem: Safety: Goal: Ability to remain free from injury will improve 01/20/2021 0952 by Orvan Seen, RN Outcome: Completed/Met 01/20/2021 0809 by Orvan Seen, RN Outcome: Progressing   Problem: Skin Integrity: Goal: Risk for impaired skin integrity will decrease 01/20/2021 0952 by Orvan Seen, RN Outcome: Completed/Met 01/20/2021 0809 by Orvan Seen, RN Outcome: Progressing

## 2021-01-20 NOTE — Telephone Encounter (Signed)
Patient's wife states patient is currently is inpatient with internal bleeding. Please call to discuss his heart cath that is scheduled on 7/18

## 2021-01-20 NOTE — Telephone Encounter (Signed)
Spoke with wife regarding patients upcoming Cath lab procedure, we are going to wait for colonoscopy / endoscopy results after 01/23/21. Dr. Garen Lah will review at that time to determine if patient should proceed with Cath Lab.  Patients wife agrees with plan.

## 2021-01-21 LAB — HEMOGLOBIN A1C
Hgb A1c MFr Bld: 6.8 % — ABNORMAL HIGH (ref 4.8–5.6)
Mean Plasma Glucose: 148.46 mg/dL

## 2021-01-21 LAB — UREA NITROGEN, URINE: Urea Nitrogen, Ur: 375 mg/dL

## 2021-01-22 ENCOUNTER — Encounter: Payer: Self-pay | Admitting: Gastroenterology

## 2021-01-23 ENCOUNTER — Ambulatory Visit
Admission: RE | Admit: 2021-01-23 | Discharge: 2021-01-23 | Disposition: A | Discharge status: Home / Self Care | Payer: BC Managed Care – PPO | Attending: Gastroenterology | Admitting: Gastroenterology

## 2021-01-23 ENCOUNTER — Ambulatory Visit: Payer: BC Managed Care – PPO | Admitting: Anesthesiology

## 2021-01-23 ENCOUNTER — Other Ambulatory Visit: Payer: Self-pay

## 2021-01-23 ENCOUNTER — Encounter: Payer: Self-pay | Admitting: Gastroenterology

## 2021-01-23 ENCOUNTER — Telehealth: Payer: Self-pay | Admitting: Cardiology

## 2021-01-23 ENCOUNTER — Encounter
Admission: RE | Disposition: A | Discharge status: Home / Self Care | Payer: Self-pay | Source: Home / Self Care | Attending: Gastroenterology

## 2021-01-23 DIAGNOSIS — D124 Benign neoplasm of descending colon: Secondary | ICD-10-CM | POA: Diagnosis not present

## 2021-01-23 DIAGNOSIS — K635 Polyp of colon: Secondary | ICD-10-CM | POA: Diagnosis not present

## 2021-01-23 DIAGNOSIS — D123 Benign neoplasm of transverse colon: Secondary | ICD-10-CM | POA: Insufficient documentation

## 2021-01-23 DIAGNOSIS — D122 Benign neoplasm of ascending colon: Secondary | ICD-10-CM | POA: Insufficient documentation

## 2021-01-23 DIAGNOSIS — Z7982 Long term (current) use of aspirin: Secondary | ICD-10-CM | POA: Insufficient documentation

## 2021-01-23 DIAGNOSIS — D500 Iron deficiency anemia secondary to blood loss (chronic): Secondary | ICD-10-CM

## 2021-01-23 DIAGNOSIS — Z7984 Long term (current) use of oral hypoglycemic drugs: Secondary | ICD-10-CM | POA: Insufficient documentation

## 2021-01-23 DIAGNOSIS — K31819 Angiodysplasia of stomach and duodenum without bleeding: Secondary | ICD-10-CM | POA: Insufficient documentation

## 2021-01-23 DIAGNOSIS — Z7902 Long term (current) use of antithrombotics/antiplatelets: Secondary | ICD-10-CM | POA: Insufficient documentation

## 2021-01-23 DIAGNOSIS — Z79899 Other long term (current) drug therapy: Secondary | ICD-10-CM | POA: Insufficient documentation

## 2021-01-23 DIAGNOSIS — Q2730 Arteriovenous malformation, site unspecified: Secondary | ICD-10-CM | POA: Diagnosis not present

## 2021-01-23 DIAGNOSIS — Z87891 Personal history of nicotine dependence: Secondary | ICD-10-CM | POA: Diagnosis not present

## 2021-01-23 DIAGNOSIS — D509 Iron deficiency anemia, unspecified: Secondary | ICD-10-CM | POA: Insufficient documentation

## 2021-01-23 DIAGNOSIS — Z888 Allergy status to other drugs, medicaments and biological substances status: Secondary | ICD-10-CM | POA: Insufficient documentation

## 2021-01-23 HISTORY — PX: COLONOSCOPY WITH PROPOFOL: SHX5780

## 2021-01-23 HISTORY — DX: Angina pectoris, unspecified: I20.9

## 2021-01-23 HISTORY — PX: ESOPHAGOGASTRODUODENOSCOPY: SHX5428

## 2021-01-23 LAB — GLUCOSE, CAPILLARY: Glucose-Capillary: 96 mg/dL (ref 70–99)

## 2021-01-23 SURGERY — COLONOSCOPY WITH PROPOFOL
Anesthesia: General

## 2021-01-23 MED ORDER — LIDOCAINE HCL (PF) 2 % IJ SOLN
INTRAMUSCULAR | Status: AC
  Filled 2021-01-23: qty 5

## 2021-01-23 MED ORDER — PROPOFOL 500 MG/50ML IV EMUL
INTRAVENOUS | Status: DC | PRN
  Administered 2021-01-23: 150 ug/kg/min via INTRAVENOUS

## 2021-01-23 MED ORDER — LIDOCAINE HCL (CARDIAC) PF 100 MG/5ML IV SOSY
PREFILLED_SYRINGE | INTRAVENOUS | Status: DC | PRN
  Administered 2021-01-23: 50 mg via INTRAVENOUS

## 2021-01-23 MED ORDER — SODIUM CHLORIDE 0.9 % IV SOLN: INTRAVENOUS | Status: DC

## 2021-01-23 MED ORDER — PROPOFOL 500 MG/50ML IV EMUL
INTRAVENOUS | Status: AC
  Filled 2021-01-23: qty 50

## 2021-01-23 NOTE — Telephone Encounter (Signed)
Patient is calling to see if Cath is still scheduled for monday

## 2021-01-23 NOTE — Progress Notes (Unsigned)
[facility] iron studies per Dr. Vicente Males

## 2021-01-23 NOTE — Anesthesia Preprocedure Evaluation (Signed)
Anesthesia Evaluation  Patient identified by MRN, date of birth, ID band Patient awake    Reviewed: Allergy & Precautions, NPO status , Patient's Chart, lab work & pertinent test results  Airway Mallampati: II  TM Distance: >3 FB Neck ROM: Full    Dental  (+) Poor Dentition   Pulmonary asthma , former smoker,    Pulmonary exam normal        Cardiovascular hypertension, + angina with exertion + CAD and + Cardiac Stents  Normal cardiovascular exam     Neuro/Psych negative neurological ROS  negative psych ROS   GI/Hepatic negative GI ROS, Neg liver ROS,   Endo/Other  negative endocrine ROSdiabetes  Renal/GU Renal disease  negative genitourinary   Musculoskeletal  (+) Arthritis ,   Abdominal   Peds negative pediatric ROS (+)  Hematology negative hematology ROS (+) anemia ,   Anesthesia Other Findings . Coronary artery disease  . Diabetes mellitus without complication ([place])  . HLD (hyperlipidemia) 04/11/2020 . Hyperlipidemia  . Hypertension  . Psoriatic arthritis ([place])  . S/P angioplasty with stent 04/10/30 of proximal and mLAD DES and residual LAD stenosis 04/11/2020 1. Left ventricular ejection fraction, by estimation, is 65 to 70% Exercise stress test was optimal for stated age. Negative for inducible ischemia. Normal stress test      Reproductive/Obstetrics negative OB ROS                             Anesthesia Physical Anesthesia Plan  ASA: 3  Anesthesia Plan: General   Post-op Pain Management:    Induction: Intravenous  PONV Risk Score and Plan: 2 and Propofol infusion and TIVA  Airway Management Planned: Natural Airway and Nasal Cannula  Additional Equipment:   Intra-op Plan:   Post-operative Plan:   Informed Consent: I have reviewed the patients History and Physical, chart, labs and discussed the procedure including the risks, benefits and alternatives for  the proposed anesthesia with the patient or authorized representative who has indicated his/her understanding and acceptance.       Plan Discussed with: CRNA, Anesthesiologist and Surgeon  Anesthesia Plan Comments:         Anesthesia Quick Evaluation

## 2021-01-23 NOTE — Telephone Encounter (Signed)
See previous tele encounter. Duplicate.

## 2021-01-23 NOTE — Anesthesia Procedure Notes (Signed)
Date/Time: 01/23/2021 11:34 AM Performed by: Vaughan Sine Pre-anesthesia Checklist: Patient identified, Emergency Drugs available, Suction available, Patient being monitored and Timeout performed Patient Re-evaluated:Patient Re-evaluated prior to induction Oxygen Delivery Method: Nasal cannula Preoxygenation: Pre-oxygenation with 100% oxygen Induction Type: IV induction Airway Equipment and Method: Bite block Placement Confirmation: CO2 detector and positive ETCO2

## 2021-01-23 NOTE — Anesthesia Postprocedure Evaluation (Signed)
Anesthesia Post Note  Patient: Jesse Ray  Procedure(s) Performed: COLONOSCOPY WITH PROPOFOL ESOPHAGOGASTRODUODENOSCOPY (EGD)  Patient location during evaluation: Phase II Anesthesia Type: General Level of consciousness: awake and alert, awake and oriented Pain management: pain level controlled Vital Signs Assessment: post-procedure vital signs reviewed and stable Respiratory status: spontaneous breathing, nonlabored ventilation and respiratory function stable Cardiovascular status: blood pressure returned to baseline and stable Postop Assessment: no apparent nausea or vomiting Anesthetic complications: no   No notable events documented.   Last Vitals:  Vitals:   01/23/21 0954 01/23/21 1204  BP: (!) 145/91   Pulse: 77   Resp: 18   Temp: (!) 35.9 C (!) 35.9 C  SpO2: 100%     Last Pain:  Vitals:   01/23/21 1234  TempSrc:   PainSc: 0-No pain                 Phill Mutter

## 2021-01-23 NOTE — Telephone Encounter (Signed)
Spoke with patients wife and informed her that we cancelled the cath lab per my note in previous documentation. She thought the nurse from his colonoscopy discharge advised to restart Plavix, I advised per Dr. Dana Allan recommendation that since his stent placement has been longer that 6 months ago, we want to hold Plavix until a source for his bleed can be found. I informed her that he may continue the ASA 81 MG for now.  Patients wife was very grateful for the information. Verbalized understanding and agreed with plan.

## 2021-01-23 NOTE — Telephone Encounter (Signed)
Called patients wife and left a VM requesting a call back. So I can give the following recommendations:  Dr. Garen Lah recommends cancelling the Cath Lab procedure scheduled for this upcoming Monday 01/27/21. With the patient continuing to hold Plavix, and the plan to perform a video capsule to visualize the small bowel in 4 weeks to investigate the source of the blood loss, we will wait for results before proceeding with cath lab.  Called cath lab and cancelled the procedure.

## 2021-01-23 NOTE — H&P (Signed)
    , MD [address], [address], [place], [place], [address] [address], [address], [place], [place], [address] Phone: [phone]  Fax: [phone]  Primary Care Physician:  Klein, Bert J III, MD   Pre-Procedure History & Physical: HPI:  Jesse Ray is a 60 y.o. male is here for an endoscopy and colonoscopy    Past Medical History:  Diagnosis Date   Anginal pain (HCC)    Coronary artery disease    Diabetes mellitus without complication ([facility])    HLD (hyperlipidemia) 04/11/2020   Hyperlipidemia    Hypertension    Psoriatic arthritis ([facility])    S/P angioplasty with stent 04/10/30 of proximal and mLAD DES and residual LAD stenosis 04/11/2020    Past Surgical History:  Procedure Laterality Date   CORONARY STENT INTERVENTION N/A 04/10/2020   Procedure: CORONARY STENT INTERVENTION;  Surgeon: Arida, Muhammad A, MD;  Location: [facility] INV[facility]IVE [facility];  Service: Cardiovascular;  Laterality: N/A;  LAD    INTRAVASCULAR ULTRASOUND/IVUS N/A 04/10/2020   Procedure: Intravascular Ultrasound/IVUS;  Surgeon: Arida, Muhammad A, MD;  Location: [facility] INVASIVE CV LAB;  Service: Cardiovascular;  Laterality: N/A;  LAD   LEFT HEART CATH AND CORONARY ANGIOGRAPHY N/A 04/01/2020   Procedure: LEFT HEART CATH AND CORONARY ANGIOGRAPHY;  Surgeon: Arida, Muhammad A, MD;  Location: [facility] INVASIVE CV LAB;  Service: Cardiovascular;  Laterality: N/A;    Prior to Admission medications   Medication Sig Start Date End Date Taking? Authorizing Provider  ascorbic acid (VITAMIN C) 1000 MG tablet Take 1,000 mg by mouth daily.    Yes [provider]  aspirin 81 MG tablet Take 81 mg by mouth daily.   Yes [provider]  cetirizine (ZYRTEC) 5 MG tablet Take 10 mg by mouth daily.   Yes [provider]  Cholecalciferol 25 MCG (1000 UT) tablet Take 1,000 Units by mouth daily.    Yes [provider]  clopidogrel (PLAVIX) 75 MG tablet Take 75 mg by mouth daily.   Yes [provider]  exenatide (BYETTA 10 MCG PEN) 10 MCG/0.04ML SOPN injection Inject 10 mcg into the skin 2 (two) times daily with a meal. 2.4 ml=1 pen 09/04/19  Yes [provider]  glimepiride (AMARYL) 4 MG tablet Take 4 mg by mouth in the morning and at bedtime.   Yes [provider]  metFORMIN (GLUCOPHAGE) 1000 MG tablet Take 1 tablet (1,000 mg total) by mouth 2 (two) times daily with a meal. 04/13/20  Yes Ingold, Laura R, NP  metoprolol succinate (TOPROL-XL) 25 MG 24 hr tablet Take 1 tablet (25 mg total) by mouth daily. 01/17/21  Yes Agbor-Etang, Brian, MD  pantoprazole (PROTONIX) 40 MG tablet Take 1 tablet (40 mg total) by mouth 2 (two) times daily. 01/20/21 02/19/21 Yes Amery, Sahar, MD  rosuvastatin (CRESTOR) 40 MG tablet Take 1 tablet (40 mg total) by mouth at bedtime. 04/11/20  Yes Ingold, Laura R, NP  vitamin E 180 MG (400 UNITS) capsule Take 400 Units by mouth daily.    Yes [provider]  Zinc 30 MG TABS Take 25 mg by mouth daily.   Yes [provider]  acetaminophen (TYLENOL) 325 MG tablet Take 2 tablets (650 mg total) by mouth every 4 (four) hours as needed for headache or mild pain. 04/11/20   Ingold, Laura R, NP  Adalimumab 40 MG/0.8ML PNKT Inject 0.4 mLs into the skin every 14 (fourteen) days.     [provider]  clobetasol cream (TEMOVATE)   0.05 % Apply 1 application topically 2 (two) times daily as needed (Psoriasis).  01/27/20   [provider]  Na Sulfate-K Sulfate-Mg Sulf (SUPREP BOWEL PREP KIT) 17.5-3.13-1.6 GM/177ML SOLN Take 1 kit by mouth as directed. 01/20/21   , , MD  nitroGLYCERIN (NITROSTAT) 0.4 MG SL tablet Place 1 tablet (0.4 mg total) under the tongue every 5 (five) minutes as needed for chest pain. 04/11/20   Ingold, Laura R, NP    Allergies as of 01/20/2021 - Review Complete 01/18/2021  Allergen Reaction Noted   Atorvastatin  10/18/2017    Family History  Problem Relation Age of Onset   Dementia Mother     Heart disease Father     Social History   Socioeconomic History   Marital status: Married    Spouse name: Not on file   Number of children: Not on file   Years of education: Not on file   Highest education level: Not on file  Occupational History   Not on file  Tobacco Use   Smoking status: Former    Years: 30.00    Types: Cigarettes    Quit date: 2009    Years since quitting: 13.5   Smokeless tobacco: Never  Vaping Use   Vaping Use: Never used  Substance and Sexual Activity   Alcohol use: Yes    Comment: occasional alcohol intake   Drug use: Never   Sexual activity: Not on file  Other Topics Concern   Not on file  Social History Narrative   Not on file   Social Determinants of Health   Financial Resource Strain: Not on file  Food Insecurity: Not on file  Transportation Needs: Not on file  Physical Activity: Not on file  Stress: Not on file  Social Connections: Not on file  Intimate Partner Violence: Not on file    Review of Systems: See HPI, otherwise negative ROS  Physical Exam: BP (!) 145/91   Pulse 77   Temp (!) 96.7 F (35.9 C) (Temporal)   Resp 18   Ht 5' 7" (1.702 m)   Wt 74.8 kg   SpO2 100%   BMI 25.84 kg/m  General:   Alert,  pleasant and cooperative in NAD Head:  Normocephalic and atraumatic. Neck:  Supple; no masses or thyromegaly. Lungs:  Clear throughout to auscultation, normal respiratory effort.    Heart:  +S1, +S2, Regular rate and rhythm, No edema. Abdomen:  Soft, nontender and nondistended. Normal bowel sounds, without guarding, and without rebound.   Neurologic:  Alert and  oriented x4;  grossly normal neurologically.  Impression/Plan: Jesse Ray is here for an endoscopy and colonoscopy  to be performed for  evaluation of iron deficiency anemia     Risks, benefits, limitations, and alternatives regarding endoscopy have been reviewed with the patient.  Questions have been answered.  All parties agreeable.    , MD   01/23/2021, 11:15 AM Iron deficiency  

## 2021-01-23 NOTE — Op Note (Signed)
[facility] Gastroenterology Patient Name: Jesse Ray Procedure Date: 01/23/2021 11:28 AM MRN: [id number] Account #: [id number] Date of Birth: 1961-04-09 Admit Type: Outpatient Age: 60 Room: [facility] ENDO ROOM 3 Gender: Male Note Status: Finalized Procedure:             Upper GI endoscopy Indications:           Iron deficiency anemia Providers:             Jonathon Bellows MD, MD Referring MD:          Ramonita Lab, MD (Referring MD) Medicines:             Monitored Anesthesia Care Complications:         No immediate complications. Procedure:             Pre-Anesthesia Assessment:                        - Prior to the procedure, a History and Physical was                         performed, and patient medications, allergies and                         sensitivities were reviewed. The patient's tolerance                         of previous anesthesia was reviewed.                        - The risks and benefits of the procedure and the                         sedation options and risks were discussed with the                         patient. All questions were answered and informed                         consent was obtained.                        - ASA Grade Assessment: II - A patient with mild                         systemic disease.                        After obtaining informed consent, the endoscope was                         passed under direct vision. Throughout the procedure,                         the patient's blood pressure, pulse, and oxygen                         saturations were monitored continuously. The Endoscope                         was introduced through the mouth, and  advanced to the                         third part of duodenum. The upper GI endoscopy was                         accomplished with ease. The patient tolerated the                         procedure well. Findings:      The esophagus was normal.      The stomach was normal.       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 Coagulation for hemostasis using argon plasma       at 0.5 liters/minute and 20 watts was successful. Biopsies for histology       were taken with a cold forceps for evaluation of celiac disease.      The exam was otherwise without abnormality. Impression:            - Normal esophagus.                        - Normal stomach.                        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                         Treated with argon plasma coagulation (APC). Biopsied.                        - The examination was otherwise normal. Recommendation:        - Await pathology results.                        - Perform a colonoscopy today. Procedure Code(s):     --- Professional ---                        [phone], 27, Esophagogastroduodenoscopy, flexible,                         transoral; with control of bleeding, any method                        [id number], Esophagogastroduodenoscopy, flexible,                         transoral; with biopsy, single or multiple Diagnosis Code(s):     --- Professional ---                        K31.819, Angiodysplasia of stomach and duodenum                         without bleeding                        D50.9, Iron deficiency anemia, unspecified CPT copyright 2019 American Medical Association. All rights reserved. The codes documented in this report are preliminary and upon coder review may  be  revised to meet current compliance requirements. Jonathon Bellows, MD Jonathon Bellows MD, MD 01/23/2021 11:44:33 AM This report has been signed electronically. Number of Addenda: 0 Note Initiated On: 01/23/2021 11:28 AM Estimated Blood Loss:  Estimated blood loss: none.      [facility]

## 2021-01-23 NOTE — Transfer of Care (Signed)
  Immediate Anesthesia Transfer of Care Note  Patient: Jesse Ray  Procedure(s) Performed: COLONOSCOPY WITH PROPOFOL ESOPHAGOGASTRODUODENOSCOPY (EGD)  Patient Location: PACU  Anesthesia Type:General  Level of Consciousness: awake and sedated  Airway & Oxygen Therapy: Patient Spontanous Breathing and Patient connected to nasal cannula oxygen  Post-op Assessment: Report given to RN and Post -op Vital signs reviewed and stable  Post vital signs: Reviewed and stable  Last Vitals:  Vitals Value Taken Time  BP    Temp    Pulse    Resp    SpO2      Last Pain:  Vitals:   01/23/21 0954  TempSrc: Temporal  PainSc: 0-No pain         Complications: No notable events documented.

## 2021-01-23 NOTE — Op Note (Signed)
[facility] Gastroenterology Patient Name: Jesse Ray Procedure Date: 01/23/2021 11:26 AM MRN: [id number] Account #: [id number] Date of Birth: 10-09-1960 Admit Type: Outpatient Age: 60 Room: [facility] ENDO ROOM 3 Gender: Male Note Status: Finalized Procedure:             Colonoscopy Indications:           Iron deficiency anemia Providers:             Jonathon Bellows MD, MD Referring MD:          Ramonita Lab, MD (Referring MD) Medicines:             Monitored Anesthesia Care Complications:         No immediate complications. Procedure:             Pre-Anesthesia Assessment:                        - Prior to the procedure, a History and Physical was                         performed, and patient medications, allergies and                         sensitivities were reviewed. The patient's tolerance                         of previous anesthesia was reviewed.                        - The risks and benefits of the procedure and the                         sedation options and risks were discussed with the                         patient. All questions were answered and informed                         consent was obtained.                        - ASA Grade Assessment: II - A patient with mild                         systemic disease.                        After obtaining informed consent, the colonoscope was                         passed under direct vision. Throughout the procedure,                         the patient's blood pressure, pulse, and oxygen                         saturations were monitored continuously. The                         Colonoscope was introduced through the anus and  advanced to the the cecum, identified by the                         appendiceal orifice. The colonoscopy was performed                         with ease. The patient tolerated the procedure well.                         The quality of the bowel preparation  was excellent. Findings:      The perianal and digital rectal examinations were normal.      A 3 mm polyp was found in the ascending colon. The polyp was sessile.       The polyp was removed with a jumbo cold forceps. Resection and retrieval       were complete.      Three sessile polyps were found in the ascending colon. The polyps were       5 to 7 mm in size. These polyps were removed with a cold snare.       Resection and retrieval were complete.      Two sessile polyps were found in the descending colon and transverse       colon. The polyps were 4 to 6 mm in size. These polyps were removed with       a cold snare. Resection and retrieval were complete.      The exam was otherwise without abnormality on direct and retroflexion       views. Impression:            - One 3 mm polyp in the ascending colon, removed with                         a jumbo cold forceps. Resected and retrieved.                        - Three 5 to 7 mm polyps in the ascending colon,                         removed with a cold snare. Resected and retrieved.                        - Two 4 to 6 mm polyps in the descending colon and in                         the transverse colon, removed with a cold snare.                         Resected and retrieved.                        - The examination was otherwise normal on direct and                         retroflexion views. Recommendation:        - Discharge patient to home (with escort).                        - Resume previous diet.                        -  Continue present medications.                        - To visualize the small bowel, perform video capsule                         endoscopy in 4 weeks. Procedure Code(s):     --- Professional ---                        [phone], Colonoscopy, flexible; with removal of                         tumor(s), polyp(s), or other lesion(s) by snare                         technique                        45380, 41,  Colonoscopy, flexible; with biopsy, single                         or multiple Diagnosis Code(s):     --- Professional ---                        K63.5, Polyp of colon                        D50.9, Iron deficiency anemia, unspecified CPT copyright 2019 American Medical Association. All rights reserved. The codes documented in this report are preliminary and upon coder review may  be revised to meet current compliance requirements. Jonathon Bellows, MD Jonathon Bellows MD, MD 01/23/2021 12:02:56 PM This report has been signed electronically. Number of Addenda: 0 Note Initiated On: 01/23/2021 11:26 AM Scope Withdrawal Time: 0 hours 12 minutes 56 seconds  Total Procedure Duration: 0 hours 13 minutes 59 seconds  Estimated Blood Loss:  Estimated blood loss: none.      [facility]

## 2021-01-24 ENCOUNTER — Telehealth: Payer: Self-pay | Admitting: Cardiology

## 2021-01-24 ENCOUNTER — Encounter: Payer: Self-pay | Admitting: Gastroenterology

## 2021-01-24 LAB — IRON,TIBC AND FERRITIN PANEL
Ferritin: 13 ng/mL — ABNORMAL LOW (ref 30–400)
Iron Saturation: 8 % — CL (ref 15–55)
Iron: 35 ug/dL — ABNORMAL LOW (ref 38–169)
Total Iron Binding Capacity: 436 ug/dL (ref 250–450)
UIBC: 401 ug/dL — ABNORMAL HIGH (ref 111–343)

## 2021-01-24 LAB — CBC
Hematocrit: 31.8 % — ABNORMAL LOW (ref 37.5–51.0)
Hemoglobin: 9.8 g/dL — ABNORMAL LOW (ref 13.0–17.7)
MCH: 25.3 pg — ABNORMAL LOW (ref 26.6–33.0)
MCHC: 30.8 g/dL — ABNORMAL LOW (ref 31.5–35.7)
MCV: 82 fL (ref 79–97)
Platelets: 281 10*3/uL (ref 150–450)
RBC: 3.87 x10E6/uL — ABNORMAL LOW (ref 4.14–5.80)
RDW: 16.2 % — ABNORMAL HIGH (ref 11.6–15.4)
WBC: 5.7 10*3/uL (ref 3.4–10.8)

## 2021-01-24 LAB — SURGICAL PATHOLOGY

## 2021-01-24 NOTE — Telephone Encounter (Signed)
Spoke with the patient. Advised him that his request has been discussed with Dr. Garen Lah. Per Dr. 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 His cardiac work up is pending.  Patient is having active cardiac symptoms and was scheduled for a cardiac cath that had to be cancelled due to an admission for low H&H that required transfusion. His Plavix has been held until his GI workup to help determine the cause has been completed.  Explained to the patient that after his GI work up is completed his GI will need to give their recommendation regarding the resumption of his Plavix/antiplatelet. Dr. Garen Lah can then make a decision regarding proceeding with the pt cardiac work up. At this time if a pci is needed a blood thinner cannot be started safely.  Patients questions answered to the best of my ability. Patient verbalized understanding and voiced appreciation for the call back.

## 2021-01-24 NOTE — Telephone Encounter (Signed)
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   Patient wants a call when ready for pick up.

## 2021-01-27 ENCOUNTER — Encounter: Admission: RE | Payer: BC Managed Care – PPO | Source: Home / Self Care

## 2021-01-27 ENCOUNTER — Telehealth: Payer: Self-pay | Admitting: Gastroenterology

## 2021-01-27 ENCOUNTER — Telehealth: Payer: Self-pay | Admitting: Cardiology

## 2021-01-27 ENCOUNTER — Ambulatory Visit
Admission: RE | Admit: 2021-01-27 | Payer: BC Managed Care – PPO | Source: Home / Self Care | Admitting: Cardiovascular Disease

## 2021-01-27 ENCOUNTER — Telehealth: Payer: Self-pay | Admitting: Cardiovascular Disease

## 2021-01-27 ENCOUNTER — Other Ambulatory Visit: Payer: Self-pay

## 2021-01-27 DIAGNOSIS — D500 Iron deficiency anemia secondary to blood loss (chronic): Secondary | ICD-10-CM

## 2021-01-27 DIAGNOSIS — Z0279 Encounter for issue of other medical certificate: Secondary | ICD-10-CM

## 2021-01-27 SURGERY — LEFT HEART CATH AND CORONARY ANGIOGRAPHY
Anesthesia: Moderate Sedation | Laterality: Left

## 2021-01-27 NOTE — Telephone Encounter (Signed)
Called patients wife back and explained that we can't schedule the cardiac cath until all testing is finished with GI. Patient is scheduled for the capsule study on 02/10/21. Patients wife verbalized understanding and agreed with plan.

## 2021-01-27 NOTE — Telephone Encounter (Signed)
Called patient and scheduled a capsule study per Dr. Vicente Males. He then chose to have it done on 02/10/2021. His instructions were sent vi MyChart.

## 2021-01-27 NOTE — Telephone Encounter (Signed)
Please call to schedule heart cath

## 2021-01-27 NOTE — Telephone Encounter (Signed)
Noted  

## 2021-01-27 NOTE — Telephone Encounter (Signed)
Received   request from [place] and notified patient via phone. He agrees to come by the office.

## 2021-01-27 NOTE — Telephone Encounter (Signed)
Patient states Merry Proud needs a capsule study scheduled per Dr. Vicente Males.

## 2021-01-27 NOTE — Telephone Encounter (Signed)
Payment and roi complete.    Placed forms and last ov note in nurse box for completion.

## 2021-01-28 NOTE — Telephone Encounter (Signed)
Completed patients forms and returned to front office.

## 2021-01-28 NOTE — Telephone Encounter (Signed)
Notified Patient forms complete and faxed with notes to [place].

## 2021-02-03 ENCOUNTER — Encounter: Payer: Self-pay | Admitting: Gastroenterology

## 2021-02-03 ENCOUNTER — Other Ambulatory Visit: Payer: Self-pay

## 2021-02-03 ENCOUNTER — Telehealth: Payer: Self-pay

## 2021-02-03 DIAGNOSIS — D500 Iron deficiency anemia secondary to blood loss (chronic): Secondary | ICD-10-CM

## 2021-02-03 NOTE — Progress Notes (Signed)
Jesse Ray/Jesse Ray Inform labs suggest very low iron : Refer to Dr Rogue Bussing for IV iron , follow up with me or Dr Allen Norris once capsule is read

## 2021-02-03 NOTE — Telephone Encounter (Signed)
Pt notified of lab results and referral to Hematology.

## 2021-02-03 NOTE — Progress Notes (Signed)
Should be ok , can see hematology in meanwhile

## 2021-02-07 ENCOUNTER — Other Ambulatory Visit: Payer: Self-pay

## 2021-02-10 ENCOUNTER — Encounter
Admission: RE | Disposition: A | Discharge status: Home / Self Care | Payer: Self-pay | Source: Home / Self Care | Attending: Gastroenterology

## 2021-02-10 ENCOUNTER — Ambulatory Visit
Admission: RE | Admit: 2021-02-10 | Discharge: 2021-02-10 | Disposition: A | Discharge status: Home / Self Care | Payer: BC Managed Care – PPO | Attending: Gastroenterology | Admitting: Gastroenterology

## 2021-02-10 ENCOUNTER — Encounter: Payer: Self-pay | Admitting: Anesthesiology

## 2021-02-10 DIAGNOSIS — D509 Iron deficiency anemia, unspecified: Secondary | ICD-10-CM

## 2021-02-10 HISTORY — PX: GIVENS CAPSULE STUDY: SHX5432

## 2021-02-10 SURGERY — IMAGING PROCEDURE, GI TRACT, INTRALUMINAL, VIA CAPSULE

## 2021-02-10 NOTE — Anesthesia Preprocedure Evaluation (Deleted)
Anesthesia Evaluation  Patient identified by MRN, date of birth, ID band Patient awake    Reviewed: Allergy & Precautions, NPO status , Patient's Chart, lab work & pertinent test results  Airway Mallampati: II  TM Distance: >3 FB Neck ROM: Full    Dental  (+) Poor Dentition   Pulmonary asthma , former smoker,    Pulmonary exam normal        Cardiovascular hypertension, + angina with exertion + CAD and + Cardiac Stents  Normal cardiovascular exam     Neuro/Psych negative neurological ROS  negative psych ROS   GI/Hepatic negative GI ROS, Neg liver ROS,   Endo/Other  negative endocrine ROSdiabetes  Renal/GU Renal disease  negative genitourinary   Musculoskeletal  (+) Arthritis ,   Abdominal   Peds negative pediatric ROS (+)  Hematology negative hematology ROS (+) anemia ,   Anesthesia Other Findings . Coronary artery disease  . Diabetes mellitus without complication ([place])  . HLD (hyperlipidemia) 04/11/2020 . Hyperlipidemia  . Hypertension  . Psoriatic arthritis ([place])  . S/P angioplasty with stent 04/10/30 of proximal and mLAD DES and residual LAD stenosis 04/11/2020 1. Left ventricular ejection fraction, by estimation, is 65 to 70% Exercise stress test was optimal for stated age. Negative for inducible ischemia. Normal stress test      Reproductive/Obstetrics negative OB ROS                             Anesthesia Physical  Anesthesia Plan  ASA: 3  Anesthesia Plan: General   Post-op Pain Management:    Induction: Intravenous  PONV Risk Score and Plan: 2 and Propofol infusion and TIVA  Airway Management Planned: Natural Airway and Nasal Cannula  Additional Equipment:   Intra-op Plan:   Post-operative Plan:   Informed Consent: I have reviewed the patients History and Physical, chart, labs and discussed the procedure including the risks, benefits and alternatives for  the proposed anesthesia with the patient or authorized representative who has indicated his/her understanding and acceptance.       Plan Discussed with: CRNA, Anesthesiologist and Surgeon  Anesthesia Plan Comments:         Anesthesia Quick Evaluation

## 2021-02-11 ENCOUNTER — Inpatient Hospital Stay: Payer: BC Managed Care – PPO

## 2021-02-11 ENCOUNTER — Inpatient Hospital Stay: Payer: BC Managed Care – PPO | Attending: Oncology | Admitting: Oncology

## 2021-02-11 ENCOUNTER — Encounter: Payer: Self-pay | Admitting: Gastroenterology

## 2021-02-11 VITALS — BP 121/78 | HR 73 | Temp 96.3°F | Resp 16 | Wt 161.7 lb

## 2021-02-11 DIAGNOSIS — R0602 Shortness of breath: Secondary | ICD-10-CM | POA: Diagnosis not present

## 2021-02-11 DIAGNOSIS — Z955 Presence of coronary angioplasty implant and graft: Secondary | ICD-10-CM | POA: Diagnosis not present

## 2021-02-11 DIAGNOSIS — Z79899 Other long term (current) drug therapy: Secondary | ICD-10-CM | POA: Insufficient documentation

## 2021-02-11 DIAGNOSIS — D631 Anemia in chronic kidney disease: Secondary | ICD-10-CM | POA: Diagnosis not present

## 2021-02-11 DIAGNOSIS — Z7984 Long term (current) use of oral hypoglycemic drugs: Secondary | ICD-10-CM | POA: Diagnosis not present

## 2021-02-11 DIAGNOSIS — L4050 Arthropathic psoriasis, unspecified: Secondary | ICD-10-CM | POA: Insufficient documentation

## 2021-02-11 DIAGNOSIS — N1831 Chronic kidney disease, stage 3a: Secondary | ICD-10-CM | POA: Insufficient documentation

## 2021-02-11 DIAGNOSIS — R079 Chest pain, unspecified: Secondary | ICD-10-CM | POA: Insufficient documentation

## 2021-02-11 DIAGNOSIS — D509 Iron deficiency anemia, unspecified: Secondary | ICD-10-CM

## 2021-02-11 DIAGNOSIS — E119 Type 2 diabetes mellitus without complications: Secondary | ICD-10-CM | POA: Insufficient documentation

## 2021-02-11 DIAGNOSIS — I2510 Atherosclerotic heart disease of native coronary artery without angina pectoris: Secondary | ICD-10-CM | POA: Diagnosis not present

## 2021-02-11 DIAGNOSIS — D500 Iron deficiency anemia secondary to blood loss (chronic): Secondary | ICD-10-CM | POA: Diagnosis not present

## 2021-02-11 HISTORY — DX: Iron deficiency anemia, unspecified: D50.9

## 2021-02-11 LAB — CBC WITH DIFFERENTIAL/PLATELET
Abs Immature Granulocytes: 0.02 10*3/uL (ref 0.00–0.07)
Basophils Absolute: 0.1 10*3/uL (ref 0.0–0.1)
Basophils Relative: 1 %
Eosinophils Absolute: 0.5 10*3/uL (ref 0.0–0.5)
Eosinophils Relative: 7 %
HCT: 33.5 % — ABNORMAL LOW (ref 39.0–52.0)
Hemoglobin: 10.6 g/dL — ABNORMAL LOW (ref 13.0–17.0)
Immature Granulocytes: 0 %
Lymphocytes Relative: 26 %
Lymphs Abs: 1.7 10*3/uL (ref 0.7–4.0)
MCH: 26.4 pg (ref 26.0–34.0)
MCHC: 31.6 g/dL (ref 30.0–36.0)
MCV: 83.3 fL (ref 80.0–100.0)
Monocytes Absolute: 0.6 10*3/uL (ref 0.1–1.0)
Monocytes Relative: 9 %
Neutro Abs: 3.7 10*3/uL (ref 1.7–7.7)
Neutrophils Relative %: 57 %
Platelets: 282 10*3/uL (ref 150–400)
RBC: 4.02 MIL/uL — ABNORMAL LOW (ref 4.22–5.81)
RDW: 16.9 % — ABNORMAL HIGH (ref 11.5–15.5)
WBC: 6.6 10*3/uL (ref 4.0–10.5)
nRBC: 0 % (ref 0.0–0.2)

## 2021-02-11 LAB — COMPREHENSIVE METABOLIC PANEL
ALT: 40 U/L (ref 0–44)
AST: 46 U/L — ABNORMAL HIGH (ref 15–41)
Albumin: 4.2 g/dL (ref 3.5–5.0)
Alkaline Phosphatase: 87 U/L (ref 38–126)
Anion gap: 9 (ref 5–15)
BUN: 26 mg/dL — ABNORMAL HIGH (ref 6–20)
CO2: 27 mmol/L (ref 22–32)
Calcium: 9.5 mg/dL (ref 8.9–10.3)
Chloride: 104 mmol/L (ref 98–111)
Creatinine, Ser: 1.53 mg/dL — ABNORMAL HIGH (ref 0.61–1.24)
GFR, Estimated: 52 mL/min — ABNORMAL LOW (ref >60)
Glucose, Bld: 124 mg/dL — ABNORMAL HIGH (ref 70–99)
Potassium: 5.4 mmol/L — ABNORMAL HIGH (ref 3.5–5.1)
Sodium: 140 mmol/L (ref 135–145)
Total Bilirubin: 0.2 mg/dL — ABNORMAL LOW (ref 0.3–1.2)
Total Protein: 7.7 g/dL (ref 6.5–8.1)

## 2021-02-11 LAB — IRON AND TIBC
Iron: 51 ug/dL (ref 45–182)
Saturation Ratios: 10 % — ABNORMAL LOW (ref 17.9–39.5)
TIBC: 505 ug/dL — ABNORMAL HIGH (ref 250–450)
UIBC: 454 ug/dL

## 2021-02-11 LAB — FERRITIN: Ferritin: 9 ng/mL — ABNORMAL LOW (ref 24–336)

## 2021-02-11 NOTE — Progress Notes (Signed)
Pt and wife in for new patient referral from Dr Vicente Males for iron deficiency anemia.  Pt had "given capsule procedure".  Pt also has chronic blood loss unsure where it is coming from per pt and wife.

## 2021-02-11 NOTE — Progress Notes (Signed)
Hematology/Oncology Consult note [facility] [phone] [phone]   Patient Care Team: Adin Hector, MD as PCP - General (Internal Medicine) Kate Sable, MD as PCP - Cardiology (Cardiology)  REFERRING PROVIDER: Jonathon Bellows, MD  CHIEF COMPLAINTS/REASON FOR VISIT:  Evaluation of iron deficiency anemia  HISTORY OF PRESENTING ILLNESS:   Jesse Ray is a  60 y.o.  male with PMH listed below was seen in consultation at the request of  Jonathon Bellows, MD  for evaluation of iron deficiency anemia.  Patient was accompanied by wife today  01/18/2021-01/20/2021, patient was admitted after being found to have abnormal lab work which were done in anticipation of upcoming heart catheterization.  Patient has reported shortness of breath and chest pain. Hemoglobin was 7.1 upon admission down from 10.2 at baseline in September 2021.  He was also found to have a creatinine of 1.8.  Iron panel indicate iron deficiency.  Patient was transfused 2 units of PRBC.  Plavix was held during admission for 5 days.  Aspirin was continued.  Renal ultrasound was obtained during admission was unremarkable.  Patient has a history of CAD, status post PCI with DES in September 2021.  01/24/2021, patient underwent EGD and colonoscopy.  Colonoscopy showed several polyps in the colon which were resected and retrieved.  Pathology showed tubular adenoma, negative high-grade dysplasia or malignancy.  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 02/10/2021, patient underwent capsule study.  Results are pending.  01/23/2021, repeat iron panel confirmed persistently decreased iron deficiency with iron saturation at 8, ferritin 13. Today patient feels fatigued.  Denies any black or bloody stool.  No nausea vomiting diarrhea, no intentional weight loss.  Chest pain has improved after blood transfusion.  Review of Systems  Constitutional:  Positive for  fatigue. Negative for appetite change, chills, fever and unexpected weight change.  HENT:   Negative for hearing loss and voice change.   Eyes:  Negative for eye problems and icterus.  Respiratory:  Positive for shortness of breath. Negative for chest tightness and cough.   Cardiovascular:  Negative for chest pain and leg swelling.  Gastrointestinal:  Negative for abdominal distention and abdominal pain.  Endocrine: Negative for hot flashes.  Genitourinary:  Negative for difficulty urinating, dysuria and frequency.   Musculoskeletal:  Negative for arthralgias.  Skin:  Negative for itching and rash.  Neurological:  Negative for light-headedness and numbness.  Hematological:  Negative for adenopathy. Does not bruise/bleed easily.  Psychiatric/Behavioral:  Negative for confusion.    MEDICAL HISTORY:  Past Medical History:  Diagnosis Date   Anginal pain ([place])    Coronary artery disease    Diabetes mellitus without complication ([place])    HLD (hyperlipidemia) 04/11/2020   Hyperlipidemia    Hypertension    Psoriatic arthritis ([place])    S/P angioplasty with stent 04/10/30 of proximal and mLAD DES and residual LAD stenosis 04/11/2020    SURGICAL HISTORY: Past Surgical History:  Procedure Laterality Date   COLONOSCOPY WITH PROPOFOL N/A 01/23/2021   Procedure: COLONOSCOPY WITH PROPOFOL;  Surgeon: Jonathon Bellows, MD;  Location: [facility] ENDOSCOPY;  Service: Gastroenterology;  Laterality: N/A;   CORONARY STENT INTERVENTION N/A 04/10/2020   Procedure: CORONARY STENT INTERVENTION;  Surgeon: Wellington Hampshire, MD;  Location: [place] CV LAB;  Service: Cardiovascular;  Laterality: N/A;  LAD    ESOPHAGOGASTRODUODENOSCOPY N/A 01/23/2021   Procedure: ESOPHAGOGASTRODUODENOSCOPY (EGD);  Surgeon: Jonathon Bellows, MD;  Location: [facility] ENDOSCOPY;  Service: Gastroenterology;  Laterality: N/A;  GIVENS CAPSULE STUDY N/A 02/10/2021   Procedure: GIVENS CAPSULE STUDY;  Surgeon: Jonathon Bellows, MD;  Location: [facility] ENDOSCOPY;   Service: Gastroenterology;  Laterality: N/A;   INTRAVASCULAR ULTRASOUND/IVUS N/A 04/10/2020   Procedure: Intravascular Ultrasound/IVUS;  Surgeon: Wellington Hampshire, MD;  Location: [place] [facility] LAB;  Service: Cardiovascular;  Laterality: N/A;  LAD   LEFT HEART CATH AND CORONARY ANGIOGRAPHY N/A 04/01/2020   Procedure: LEFT HEART CATH AND CORONARY ANGIOGRAPHY;  Surgeon: Wellington Hampshire, MD;  Location: [place] [facility] LAB;  Service: Cardiovascular;  Laterality: N/A;    SOCIAL HISTORY: Social History   Socioeconomic History   Marital status: Married    Spouse name: Not on file   Number of children: Not on file   Years of education: Not on file   Highest education level: Not on file  Occupational History   Not on file  Tobacco Use   Smoking status: Former    Years: 30.00    Types: Cigarettes    Quit date: 2009    Years since quitting: 13.5   Smokeless tobacco: Never  Vaping Use   Vaping Use: Never used  Substance and Sexual Activity   Alcohol use: Yes    Comment: occasional alcohol intake   Drug use: Never   Sexual activity: Yes  Other Topics Concern   Not on file  Social History Narrative   Not on file   Social Determinants of Health   Financial Resource Strain: Not on file  Food Insecurity: Not on file  Transportation Needs: Not on file  Physical Activity: Not on file  Stress: Not on file  Social Connections: Not on file  Intimate Partner Violence: Not on file    FAMILY HISTORY: Family History  Problem Relation Age of Onset   Dementia Mother    Heart disease Father    Throat cancer Maternal Grandfather     ALLERGIES:  is allergic to atorvastatin.  MEDICATIONS:  Current Outpatient Medications  Medication Sig Dispense Refill   acetaminophen (TYLENOL) 325 MG tablet Take 2 tablets (650 mg total) by mouth every 4 (four) hours as needed for headache or mild pain.     Adalimumab 40 MG/0.8ML PNKT Inject 0.4 mLs into the skin every 14 (fourteen) days.       ascorbic acid (VITAMIN C) 1000 MG tablet Take 1,000 mg by mouth daily.      aspirin 81 MG tablet Take 81 mg by mouth daily.     cetirizine (ZYRTEC) 5 MG tablet Take 10 mg by mouth daily.     Cholecalciferol 25 MCG (1000 UT) tablet Take 1,000 Units by mouth daily.      clobetasol cream (TEMOVATE) 5.32 % Apply 1 application topically 2 (two) times daily as needed (Psoriasis).      exenatide (BYETTA 10 MCG PEN) 10 MCG/0.04ML SOPN injection Inject 10 mcg into the skin 2 (two) times daily with a meal. 2.4 ml=1 pen     glimepiride (AMARYL) 4 MG tablet Take 4 mg by mouth in the morning and at bedtime.     metFORMIN (GLUCOPHAGE) 1000 MG tablet Take 1 tablet (1,000 mg total) by mouth 2 (two) times daily with a meal.     metoprolol succinate (TOPROL-XL) 25 MG 24 hr tablet Take 1 tablet (25 mg total) by mouth daily. 90 tablet 3   nitroGLYCERIN (NITROSTAT) 0.4 MG SL tablet Place 1 tablet (0.4 mg total) under the tongue every 5 (five) minutes as needed for chest pain. 25 tablet 4  pantoprazole (PROTONIX) 40 MG tablet Take 1 tablet (40 mg total) by mouth 2 (two) times daily. 60 tablet 0   rosuvastatin (CRESTOR) 40 MG tablet Take 1 tablet (40 mg total) by mouth at bedtime. 30 tablet 11   vitamin E 180 MG (400 UNITS) capsule Take 400 Units by mouth daily.      Zinc 30 MG TABS Take 25 mg by mouth daily.     albuterol (VENTOLIN HFA) 108 (90 Base) MCG/ACT inhaler SMARTSIG:2 Puff(s) By Mouth Every 6 Hours PRN (Patient not taking: Reported on 02/11/2021)     Current Facility-Administered Medications  Medication Dose Route Frequency Provider Last Rate Last Admin   sodium chloride flush (NS) 0.9 % injection 3 mL  3 mL Intravenous Q12H Kate Sable, MD         PHYSICAL EXAMINATION: ECOG PERFORMANCE STATUS: 1 - Symptomatic but completely ambulatory Vitals:   02/11/21 1001  BP: 121/78  Pulse: 73  Resp: 16  Temp: (!) 96.3 F (35.7 C)  SpO2: 99%   Filed Weights   02/11/21 1001  Weight: 161 lb 11.2 oz  (73.3 kg)    Physical Exam Constitutional:      General: He is not in acute distress. HENT:     Head: Normocephalic and atraumatic.  Eyes:     General: No scleral icterus. Cardiovascular:     Rate and Rhythm: Normal rate and regular rhythm.     Heart sounds: Normal heart sounds.  Pulmonary:     Effort: Pulmonary effort is normal. No respiratory distress.     Breath sounds: No wheezing.  Abdominal:     General: Bowel sounds are normal. There is no distension.     Palpations: Abdomen is soft.  Musculoskeletal:        General: No deformity. Normal range of motion.     Cervical back: Normal range of motion and neck supple.  Skin:    General: Skin is warm and dry.     Findings: No erythema or rash.  Neurological:     Mental Status: He is alert and oriented to person, place, and time. Mental status is at baseline.     Cranial Nerves: No cranial nerve deficit.     Coordination: Coordination normal.  Psychiatric:        Mood and Affect: Mood normal.    LABORATORY DATA:  I have reviewed the data as listed Lab Results  Component Value Date   WBC 6.6 02/11/2021   HGB 10.6 (L) 02/11/2021   HCT 33.5 (L) 02/11/2021   MCV 83.3 02/11/2021   PLT 282 02/11/2021   Recent Labs    03/28/20 1025 03/28/20 1025 04/08/20 1011 04/11/20 0213 04/11/20 0213 05/24/20 1116 01/17/21 0858 01/18/21 1529 01/19/21 0653 01/20/21 0401 02/11/21 1056  NA 141   < > 138 139  --  139   < > 141 140 141 140  K 5.1  --  4.5 4.2  --  4.0   < > 4.7 4.2 4.2 5.4*  CL 104  --  103 105  --  102   < > 112* 112* 111 104  CO2 22  --  26 25  --  27   < > 23 25 26 27   GLUCOSE 260*   < > 255* 311*  --  164*   < > 138* 62* 163* 124*  BUN 24   < > 29* 18  --  20   < > 26* 25* 28* 26*  CREATININE 1.25  --  1.07 1.07  --  1.20   < > 1.88* 1.76* 1.78* 1.53*  CALCIUM 9.8  --  9.2 9.2  --  9.5   < > 8.8* 8.8* 9.1 9.5  GFRNONAA 64  --  >60 >60   < > >60  --  41* 44* 44* 52*  GFRAA 73  --  >60 >60  --   --   --   --    --   --   --   PROT  --   --   --   --   --  7.5  --  7.2  --   --  7.7  ALBUMIN  --   --   --   --   --  4.0  --  3.7  --   --  4.2  AST  --   --   --   --   --  35  --  29  --   --  46*  ALT  --   --   --   --   --  35  --  17  --   --  40  ALKPHOS  --   --   --   --   --  93  --  60  --   --  87  BILITOT  --   --   --   --   --  0.4  --  0.5  --   --  0.2*   < > = values in this interval not displayed.   Iron/TIBC/Ferritin/ %Sat    Component Value Date/Time   IRON 51 02/11/2021 1056   IRON 35 (L) 01/23/2021 1343   TIBC 505 (H) 02/11/2021 1056   TIBC 436 01/23/2021 1343   FERRITIN 9 (L) 02/11/2021 1056   FERRITIN 13 (L) 01/23/2021 1343   IRONPCTSAT 10 (L) 02/11/2021 1056   IRONPCTSAT 8 (LL) 01/23/2021 1343      RADIOGRAPHIC STUDIES: I have personally reviewed the radiological images as listed and agreed with the findings in the report. DG Chest 2 View  Result Date: 01/18/2021 CLINICAL DATA:  Shortness of breath, chest pain EXAM: CHEST - 2 VIEW COMPARISON:  None. FINDINGS: The heart size and mediastinal contours are within normal limits. Both lungs are clear. The visualized skeletal structures are unremarkable. IMPRESSION: No acute abnormality of the lungs. Electronically Signed   By: Eddie Candle M.D.   On: 01/18/2021 15:59   US RENAL  Result Date: 01/18/2021 CLINICAL DATA:  60 year old male with acute renal insufficiency. EXAM: RENAL / URINARY TRACT ULTRASOUND COMPLETE COMPARISON:  None. FINDINGS: Right Kidney: Renal measurements: 11.0 x 4.9 x 5.4 cm = volume: 152 mL. Echogenicity within normal limits. No mass or hydronephrosis visualized. Left Kidney: Renal measurements: 11.6 x 5.9 x 5.2 cm = volume: 188 mL. Echogenicity within normal limits. No mass or hydronephrosis visualized. Bladder: Appears normal for degree of bladder distention. Bilateral ureteral jets noted. Other: The prostate gland measures 4.0 x 3.7 x 4.2 cm for a volume of 32 cc. IMPRESSION: Unremarkable renal  ultrasound. Electronically Signed   By: Anner Crete M.D.   On: 01/18/2021 20:38      ASSESSMENT & PLAN:  1. Iron deficiency anemia due to chronic blood loss   2. Anemia in stage 3a chronic kidney disease (HCC)    Iron deficiency anemia secondary to chronic blood loss. Labs reviewed and discussed with patient. Gust about option of oral iron supplementation vs IV  Venofer treatments. Side effects of oral iron supplementation were discussed with patient. He understands that oral iron supplementation takes longer to improve his blood work and absorption efficiency various individually. Discussed about rationale of IV iron and side effects.  Allergy reactions/infusion reaction including anaphylactic reaction discussed with patient. Other side effects include but not limited to high blood pressure, skin rash, weight gain, leg swelling, etc. patient would like to have blood work rechecked today, further discussed with his family members and then decide between oral iron supplementation versus IV iron treatments.   Check CBC, iron, TIBC ferritin.  Chronic kidney disease, stage IIIa. I will check SPEP In the context of chronic kidney disease, I recommend aggressive iron supplementation to keep ferritin at least above 100, preferably above 200.  This probably needs to be achieved with IV Venofer treatments.  Continue follow-up with GI for small bowel capsule study.   Orders Placed This Encounter  Procedures   CBC with Differential/Platelet    Standing Status:   Future    Number of Occurrences:   1    Standing Expiration Date:   02/11/2022   Ferritin    Standing Status:   Future    Number of Occurrences:   1    Standing Expiration Date:   02/11/2022   Iron and TIBC    Standing Status:   Future    Number of Occurrences:   1    Standing Expiration Date:   02/11/2022   Comprehensive metabolic panel    Standing Status:   Future    Number of Occurrences:   1    Standing Expiration Date:    02/11/2022   Protein electrophoresis, serum    Standing Status:   Future    Number of Occurrences:   1    Standing Expiration Date:   02/11/2022    All questions were answered. The patient knows to call the clinic with any problems questions or concerns.  cc Jonathon Bellows, MD    Return of visit: To be determined.  Depending on patient's decision of iron supplementation options. Thank you for this kind referral and the opportunity to participate in the care of this patient. A copy of today's note is routed to referring provider    Earlie Server, MD, PhD Hematology Oncology [facility] Pager- [phone] 02/11/2021

## 2021-02-12 ENCOUNTER — Institutional Professional Consult (permissible substitution): Payer: BC Managed Care – PPO | Admitting: Internal Medicine

## 2021-02-12 NOTE — Progress Notes (Signed)
Cardiology Office Note:    Date:  02/13/2021   ID:  Jesse Ray, DOB [birth year], MRN [id number]  PCP:  Adin Hector, MD  [facility] Cardiologist:  Kate Sable, MD  [facility] Electrophysiologist:  None   Referring MD: Adin Hector, MD   Chief Complaint: 3-4 week follow-up  History of Present Illness:    Jesse Ray is a 60 y.o. male with a hx of CAD s/p PCI/DES x 2 to the LAD in 03/2020, DM2 with diabetic nephropathy, HTN, HLD, family h/o premature CAD in his father with MI at 15 who is being seen for 3-4 week hospital follow-up.   Patient was seen 01/17/21 for chest pain and sob. She had undergone PCI x2 LAD 03/2020. Cath showed 85% RCA which was not intervened on. Echo showed preserved EF. Since she was high risk and set up for cardiac cath. Before she could undergo cath she was admitted 01/19/2021 found to have anemia given 2units PRBCs seen by cardiology for cardiac pre-procedural risk stratification for EGD/colonoscopy. Plavix was held and Aspirin was continued.   Today, the patient reports he continues to be tired. Denies chest pain and shortness of breath. He underwent EGD/colonoscopy 7/14 which showed several polyps which were resected, pathology showed tubular adenoma. 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 On 8/1 he underwent capsule study, results pending. Has seen the hematologist and he started the iron pills. Most recent Hgb 10.6. Energy slowly getting better.Has not gone back to work, but he is doing things around the house. Might go back to work August 10th. He is eating and drinking normally. Yesterday he vomited after taking metformin. Encouraged he take it with food. BP mildly elevated today, says it's 120/70 at home. He had his medications today. He is taking Toprol 25mg  daily. He is tolerating Crestor. No LLE, palpitations, Orthopnea, or pnd. He will see the hematologist back next week.   Labs from 8/2 showed Cr 1.53, BUN 26, K 5.4,  AST 46. Hgb 10.6  He is not on any potassium supplements.   Past Medical History:  Diagnosis Date   Anginal pain ([place])    Coronary artery disease    Diabetes mellitus without complication ([place])    HLD (hyperlipidemia) 04/11/2020   Hyperlipidemia    Hypertension    IDA (iron deficiency anemia) 02/11/2021   Psoriatic arthritis ([place])    S/P angioplasty with stent 04/10/30 of proximal and mLAD DES and residual LAD stenosis 04/11/2020    Past Surgical History:  Procedure Laterality Date   COLONOSCOPY WITH PROPOFOL N/A 01/23/2021   Procedure: COLONOSCOPY WITH PROPOFOL;  Surgeon: Jonathon Bellows, MD;  Location: [facility] ENDOSCOPY;  Service: Gastroenterology;  Laterality: N/A;   CORONARY STENT INTERVENTION N/A 04/10/2020   Procedure: CORONARY STENT INTERVENTION;  Surgeon: Wellington Hampshire, MD;  Location: [place] CV LAB;  Service: Cardiovascular;  Laterality: N/A;  LAD    ESOPHAGOGASTRODUODENOSCOPY N/A 01/23/2021   Procedure: ESOPHAGOGASTRODUODENOSCOPY (EGD);  Surgeon: Jonathon Bellows, MD;  Location: [facility] ENDOSCOPY;  Service: Gastroenterology;  Laterality: N/A;   GIVENS CAPSULE STUDY N/A 02/10/2021   Procedure: GIVENS CAPSULE STUDY;  Surgeon: Jonathon Bellows, MD;  Location: [facility] ENDOSCOPY;  Service: Gastroenterology;  Laterality: N/A;   INTRAVASCULAR ULTRASOUND/IVUS N/A 04/10/2020   Procedure: Intravascular Ultrasound/IVUS;  Surgeon: Wellington Hampshire, MD;  Location: [place] CV LAB;  Service: Cardiovascular;  Laterality: N/A;  LAD   LEFT HEART CATH AND CORONARY ANGIOGRAPHY N/A 04/01/2020   Procedure: LEFT HEART CATH AND CORONARY ANGIOGRAPHY;  Surgeon: Wellington Hampshire, MD;  Location: [place] CV LAB;  Service: Cardiovascular;  Laterality: N/A;    Current Medications: Current Meds  Medication Sig   acetaminophen (TYLENOL) 325 MG tablet Take 2 tablets (650 mg total) by mouth every 4 (four) hours as needed for headache or mild pain.   Adalimumab 40 MG/0.8ML PNKT Inject 0.4 mLs into the skin every 14  (fourteen) days.    ascorbic acid (VITAMIN C) 1000 MG tablet Take 1,000 mg by mouth daily.    aspirin 81 MG tablet Take 81 mg by mouth daily.   cetirizine (ZYRTEC) 5 MG tablet Take 10 mg by mouth daily.   Cholecalciferol 25 MCG (1000 UT) tablet Take 1,000 Units by mouth daily.    clobetasol cream (TEMOVATE) 4.74 % Apply 1 application topically 2 (two) times daily as needed (Psoriasis).    exenatide (BYETTA 10 MCG PEN) 10 MCG/0.04ML SOPN injection Inject 10 mcg into the skin 2 (two) times daily with a meal. 2.4 ml=1 pen   glimepiride (AMARYL) 4 MG tablet Take 4 mg by mouth in the morning and at bedtime.   metFORMIN (GLUCOPHAGE) 1000 MG tablet Take 1 tablet (1,000 mg total) by mouth 2 (two) times daily with a meal.   metoprolol succinate (TOPROL-XL) 25 MG 24 hr tablet Take 1 tablet (25 mg total) by mouth daily.   nitroGLYCERIN (NITROSTAT) 0.4 MG SL tablet Place 1 tablet (0.4 mg total) under the tongue every 5 (five) minutes as needed for chest pain.   pantoprazole (PROTONIX) 40 MG tablet Take 1 tablet (40 mg total) by mouth 2 (two) times daily.   rosuvastatin (CRESTOR) 40 MG tablet Take 1 tablet (40 mg total) by mouth at bedtime.   vitamin E 180 MG (400 UNITS) capsule Take 400 Units by mouth daily.    Zinc 30 MG TABS Take 25 mg by mouth daily.   Current Facility-Administered Medications for the 02/13/21 encounter (Office Visit) with Kathlen Mody, Arelyn Gauer H, PA-C  Medication   sodium chloride flush (NS) 0.9 % injection 3 mL     Allergies:   Atorvastatin   Social History   Socioeconomic History   Marital status: Married    Spouse name: Not on file   Number of children: Not on file   Years of education: Not on file   Highest education level: Not on file  Occupational History   Not on file  Tobacco Use   Smoking status: Former    Years: 30.00    Types: Cigarettes    Quit date: 2009    Years since quitting: 13.5   Smokeless tobacco: Never  Vaping Use   Vaping Use: Never used  Substance and  Sexual Activity   Alcohol use: Yes    Comment: occasional alcohol intake   Drug use: Never   Sexual activity: Yes  Other Topics Concern   Not on file  Social History Narrative   Not on file   Social Determinants of Health   Financial Resource Strain: Not on file  Food Insecurity: Not on file  Transportation Needs: Not on file  Physical Activity: Not on file  Stress: Not on file  Social Connections: Not on file     Family History: The patient's family history includes Dementia in his mother; Heart disease in his father; Throat cancer in his maternal grandfather.  ROS:   Please see the history of present illness.     All other systems reviewed and are negative.  EKGs/Labs/Other Studies Reviewed:  The following studies were reviewed today:   Cardiac cath 04/10/20 CORONARY STENT INTERVENTION  Intravascular Ultrasound/IVUS      Conclusion     Mid LAD lesion is 85% stenosed. 2nd Mrg lesion is 40% stenosed. Prox RCA lesion is 85% stenosed. Ost LAD lesion is 40% stenosed. 2nd Diag lesion is 60% stenosed. Post intervention, there is a 0% residual stenosis. A drug-eluting stent was successfully placed using a STENT RESOLUTE ONYX 3.0X18. Dist LAD-1 lesion is 30% stenosed. Dist LAD-2 lesion is 70% stenosed. Prox LAD lesion is 85% stenosed. Post intervention, there is a 0% residual stenosis. A drug-eluting stent was successfully placed using a STENT RESOLUTE ONYX 4.0X18.   Successful IVUS, angioplasty and drug-eluting stent placement to the mid and proximal LAD using 2 nonoverlapped stents.  Overall difficult procedure due to calcifications.  Intravascular lithotripsy was attempted but had to be aborted due to malfunction in the device which did not resolve after exchanging the catheter.   Recommendations: Dual antiplatelet therapy for at least 6 months but preferably long-term as tolerated given his residual disease. The patient requires aggressive medical therapy as he  has diffuse calcified disease.   Echo 04/09/20  1. Left ventricular ejection fraction, by estimation, is 65 to 70%. The  left ventricle has normal function. The left ventricle has no regional  wall motion abnormalities. Left ventricular diastolic parameters were  normal.   2. Right ventricular systolic function is normal. The right ventricular  size is normal. Tricuspid regurgitation signal is inadequate for assessing  PA pressure.   3. The mitral valve is normal in structure. No evidence of mitral valve  regurgitation. No evidence of mitral stenosis.   4. The aortic valve is tricuspid. Aortic valve regurgitation is not  visualized. No aortic stenosis is present.   5. The inferior vena cava is normal in size with greater than 50%  respiratory variability, suggesting right atrial pressure of 3 mmHg.   EKG:  EKG is not ordered today.   Recent Labs: 02/11/2021: ALT 40; BUN 26; Creatinine, Ser 1.53; Hemoglobin 10.6; Platelets 282; Potassium 5.4; Sodium 140  Recent Lipid Panel    Component Value Date/Time   CHOL 92 05/24/2020 1116   CHOL 169 03/28/2020 1025   TRIG 59 05/24/2020 1116   HDL 36 (L) 05/24/2020 1116   HDL 39 (L) 03/28/2020 1025   CHOLHDL 2.6 05/24/2020 1116   VLDL 12 05/24/2020 1116   LDLCALC 44 05/24/2020 1116   LDLCALC 96 03/28/2020 1025      Physical Exam:    VS:  BP 140/70 (BP Location: Left Arm, Patient Position: Sitting, Cuff Size: Normal)   Pulse 82   Ht 5\' 7"  (1.702 m)   Wt 162 lb (73.5 kg)   SpO2 98%   BMI 25.37 kg/m     Wt Readings from Last 3 Encounters:  02/13/21 162 lb (73.5 kg)  02/11/21 161 lb 11.2 oz (73.3 kg)  01/23/21 165 lb (74.8 kg)     GEN:  Well nourished, well developed in no acute distress HEENT: Normal NECK: No JVD; No carotid bruits LYMPHATICS: No lymphadenopathy CARDIAC: RRR, no murmurs, rubs, gallops RESPIRATORY:  Clear to auscultation without rales, wheezing or rhonchi  ABDOMEN: Soft, non-tender,  non-distended MUSCULOSKELETAL:  No edema; No deformity  SKIN: Warm and dry NEUROLOGIC:  Alert and oriented x 3 PSYCHIATRIC:  Normal affect   ASSESSMENT:    1. Coronary artery disease involving native coronary artery of native heart, unspecified whether angina present   2.  Hyperlipidemia LDL goal <70   3. Essential hypertension   4. Iron deficiency anemia, unspecified iron deficiency anemia type   5. Hyperkalemia   6. Chronic kidney disease, unspecified CKD stage    PLAN:    In order of problems listed above:  CAD s/p PCIx2 to LAD 03/2020 Patient seen in the office 7/8 for chest pain and sob and was set up for cath. Pre-procedural labs was found Hgb was 7.5 and he was admitted for acute anemia and transfused blood. Plavix was stopped since it had been at least 6 months since stenting. Aspirin was 81mg  continued. Since then he has been following with hematology and GI. He underwent EGD/colonoscopy 7/14 and capsule study 8/1, results are still pending. Most recent Hgb 10.6 and is on iron pills. Since discharge he reports energy is slowly improving, still feels tired in the afternoon. Denies chest pain or shortness of breath. No LLE, orthopnea, pnd, palpitations. He denies black or bloody stools. Continue aspirin, statin, BB. We will see him back in 2 months, once iron/Hgb is better, to reevaluate for further ischemic work-up.   HTN BP mildly elevated in the office, but he says at home it is 120s/70s. Continue toprol 25mg  daily, can titrate up or switch to Coreg if BP continues to be elevated.   HLD Continue Crestor 40mg . Last LDL was 26. AST 46, will re-check LFTs in 2 weeks  Anemia Following closely with hematology. Most recent Hgb 10.6 and he is taking iron pills as above, he may do IV iron. Also awaiting results of capsule study to see if he is still bleeding. Patient denies overt bleeding. He taking Aspirin.   Hyperkalemia CKD stage 3 Recent labs showed fairly stable creatinine and  K+ 5.4. He is not on potassium supplements. Re-check labs in 1-2 weeks.   Disposition: Follow up in 2 month(s) with MD    Signed, Shahid Flori Ninfa Meeker, PA-C  02/13/2021 9:01 AM    [place]

## 2021-02-13 ENCOUNTER — Encounter: Payer: Self-pay | Admitting: Medical

## 2021-02-13 ENCOUNTER — Ambulatory Visit (INDEPENDENT_AMBULATORY_CARE_PROVIDER_SITE_OTHER): Payer: BC Managed Care – PPO | Admitting: Medical

## 2021-02-13 ENCOUNTER — Other Ambulatory Visit: Payer: Self-pay

## 2021-02-13 ENCOUNTER — Encounter: Payer: Self-pay | Admitting: Gastroenterology

## 2021-02-13 VITALS — BP 140/70 | HR 82 | Ht 67.0 in | Wt 162.0 lb

## 2021-02-13 DIAGNOSIS — D509 Iron deficiency anemia, unspecified: Secondary | ICD-10-CM | POA: Diagnosis not present

## 2021-02-13 DIAGNOSIS — E875 Hyperkalemia: Secondary | ICD-10-CM

## 2021-02-13 DIAGNOSIS — I10 Essential (primary) hypertension: Secondary | ICD-10-CM | POA: Diagnosis not present

## 2021-02-13 DIAGNOSIS — I2510 Atherosclerotic heart disease of native coronary artery without angina pectoris: Secondary | ICD-10-CM | POA: Diagnosis not present

## 2021-02-13 DIAGNOSIS — E785 Hyperlipidemia, unspecified: Secondary | ICD-10-CM

## 2021-02-13 DIAGNOSIS — N189 Chronic kidney disease, unspecified: Secondary | ICD-10-CM

## 2021-02-13 LAB — PROTEIN ELECTROPHORESIS, SERUM
A/G Ratio: 1.2 (ref 0.7–1.7)
Albumin ELP: 3.9 g/dL (ref 2.9–4.4)
Alpha-1-Globulin: 0.2 g/dL (ref 0.0–0.4)
Alpha-2-Globulin: 1 g/dL (ref 0.4–1.0)
Beta Globulin: 1.1 g/dL (ref 0.7–1.3)
Gamma Globulin: 1 g/dL (ref 0.4–1.8)
Globulin, Total: 3.3 g/dL (ref 2.2–3.9)
Total Protein ELP: 7.2 g/dL (ref 6.0–8.5)

## 2021-02-13 NOTE — Patient Instructions (Signed)
Medication Instructions:  No changes at this time.  *If you need a refill on your cardiac medications before your next appointment, please call your pharmacy*   Lab Work: CMET to be done in 1-2 weeks (Around 02/20/21 or 02/23/21).   These could be done at the Cancer center if you are having some done there. If not done there you can go to [facility] then go to 1st desk on the right to check in, past the screening table. No appointment is needed for this.  Lab hours: Monday- Friday (7:30 am- 5:30 pm)  If you have labs (blood work) drawn today and your tests are completely normal, you will receive your results only by: [place] (if you have MyChart) OR A paper copy in the mail If you have any lab test that is abnormal or we need to change your treatment, we will call you to review the results.   Testing/Procedures: None   Follow-Up: At [facility], you and your health needs are our priority.  As part of our continuing mission to provide you with exceptional heart care, we have created designated Provider Care Teams.  These Care Teams include your primary Cardiologist (physician) and Advanced Practice Providers (APPs -  Physician Assistants and Nurse Practitioners) who all work together to provide you with the care you need, when you need it.    Your next appointment:   2 month(s)  The format for your next appointment:   In Person  Provider:   Kate Sable, MD

## 2021-02-17 ENCOUNTER — Ambulatory Visit: Payer: BC Managed Care – PPO | Admitting: Physician Assistant

## 2021-02-20 ENCOUNTER — Inpatient Hospital Stay: Payer: BC Managed Care – PPO

## 2021-02-20 VITALS — BP 155/91 | HR 82 | Temp 96.3°F | Resp 18

## 2021-02-20 DIAGNOSIS — D509 Iron deficiency anemia, unspecified: Secondary | ICD-10-CM | POA: Diagnosis not present

## 2021-02-20 DIAGNOSIS — D500 Iron deficiency anemia secondary to blood loss (chronic): Secondary | ICD-10-CM

## 2021-02-20 MED ORDER — SODIUM CHLORIDE 0.9 % IV SOLN: 200.0000 mg | Freq: Once | INTRAVENOUS | Status: DC

## 2021-02-20 MED ORDER — SODIUM CHLORIDE 0.9 % IV SOLN
Freq: Once | INTRAVENOUS | Status: AC
  Filled 2021-02-20: qty 250

## 2021-02-20 MED ORDER — IRON SUCROSE 20 MG/ML IV SOLN
200.0000 mg | Freq: Once | INTRAVENOUS | Status: AC
  Administered 2021-02-20: 200 mg via INTRAVENOUS
  Filled 2021-02-20: qty 10

## 2021-02-20 NOTE — Progress Notes (Signed)
Pt has decided to proceed with Venofer infusions. Pt here for first infusion. Reviewed other infusion appts and plan with pt. Verbalized understanding

## 2021-02-20 NOTE — Patient Instructions (Signed)
[facility] [place] [facility]  Discharge Instructions: Thank you for choosing [facility] to provide your oncology and hematology care.  If you have a lab appointment with the Cancer Center, please go directly to the Cancer Center and check in at the registration area.  Wear comfortable clothing and clothing appropriate for easy access to any Portacath or PICC line.   We strive to give you quality time with your provider. You may need to reschedule your appointment if you arrive late (15 or more minutes).  Arriving late affects you and other patients whose appointments are after yours.  Also, if you miss three or more appointments without notifying the office, you may be dismissed from the clinic at the provider's discretion.      For prescription refill requests, have your pharmacy contact our office and allow 72 hours for refills to be completed.    Today you received the following chemotherapy and/or immunotherapy agents VENOFER      To help prevent nausea and vomiting after your treatment, we encourage you to take your nausea medication as directed.  BELOW ARE SYMPTOMS THAT SHOULD BE REPORTED IMMEDIATELY: *FEVER GREATER THAN 100.4 F (38 C) OR HIGHER *CHILLS OR SWEATING *NAUSEA AND VOMITING THAT IS NOT CONTROLLED WITH YOUR NAUSEA MEDICATION *UNUSUAL SHORTNESS OF BREATH *UNUSUAL BRUISING OR BLEEDING *URINARY PROBLEMS (pain or burning when urinating, or frequent urination) *BOWEL PROBLEMS (unusual diarrhea, constipation, pain near the anus) TENDERNESS IN MOUTH AND THROAT WITH OR WITHOUT PRESENCE OF ULCERS (sore throat, sores in mouth, or a toothache) UNUSUAL RASH, SWELLING OR PAIN  UNUSUAL VAGINAL DISCHARGE OR ITCHING   Items with * indicate a potential emergency and should be followed up as soon as possible or go to the Emergency Department if any problems should occur.  Please show the CHEMOTHERAPY ALERT CARD or IMMUNOTHERAPY ALERT CARD at check-in to  the Emergency Department and triage nurse.  Should you have questions after your visit or need to cancel or reschedule your appointment, please contact [facility] [place] [facility]  [phone] and follow the prompts.  Office hours are 8:00 a.m. to 4:30 p.m. Monday - Friday. Please note that voicemails left after 4:00 p.m. may not be returned until the following business day.  We are closed weekends and major holidays. You have access to a nurse at all times for urgent questions. Please call the main number to the clinic [phone] and follow the prompts.  For any non-urgent questions, you may also contact your provider using MyChart. We now offer e-Visits for anyone 18 and older to request care online for non-urgent symptoms. For details visit [email or web address].   Also download the MyChart app! Go to the app store, search "MyChart", open the app, select [facility], and log in with your MyChart username and password.  Due to Covid, a mask is required upon entering the hospital/clinic. If you do not have a mask, one will be given to you upon arrival. For doctor visits, patients may have 1 support person aged 18 or older with them. For treatment visits, patients cannot have anyone with them due to current Covid guidelines and our immunocompromised population.   Iron Sucrose injection What is this medication? IRON SUCROSE (AHY ern SOO krohs) is an iron complex. Iron is used to make healthy red blood cells, which carry oxygen and nutrients throughout the body. This medicine is used to treat iron deficiency anemia in people with chronickidney disease. This medicine may be used for other   purposes; ask your health care provider orpharmacist if you have questions. COMMON BRAND NAME(S): Venofer What should I tell my care team before I take this medication? They need to know if you have any of these conditions: anemia not caused by low iron levels heart disease high levels of  iron in the blood kidney disease liver disease an unusual or allergic reaction to iron, other medicines, foods, dyes, or preservatives pregnant or trying to get pregnant breast-feeding How should I use this medication? This medicine is for infusion into a vein. It is given by a health careprofessional in a hospital or clinic setting. Talk to your pediatrician regarding the use of this medicine in children. While this drug may be prescribed for children as young as 2 years for selectedconditions, precautions do apply. Overdosage: If you think you have taken too much of this medicine contact apoison control center or emergency room at once. NOTE: This medicine is only for you. Do not share this medicine with others. What if I miss a dose? It is important not to miss your dose. Call your doctor or health careprofessional if you are unable to keep an appointment. What may interact with this medication? Do not take this medicine with any of the following medications: deferoxamine dimercaprol other iron products This medicine may also interact with the following medications: chloramphenicol deferasirox This list may not describe all possible interactions. Give your health care provider a list of all the medicines, herbs, non-prescription drugs, or dietary supplements you use. Also tell them if you smoke, drink alcohol, or use illegaldrugs. Some items may interact with your medicine. What should I watch for while using this medication? Visit your doctor or healthcare professional regularly. Tell your doctor or healthcare professional if your symptoms do not start to get better or if theyget worse. You may need blood work done while you are taking this medicine. You may need to follow a special diet. Talk to your doctor. Foods that contain iron include: whole grains/cereals, dried fruits, beans, or peas, leafy greenvegetables, and organ meats (liver, kidney). What side effects may I notice from  receiving this medication? Side effects that you should report to your doctor or health care professionalas soon as possible: allergic reactions like skin rash, itching or hives, swelling of the face, lips, or tongue breathing problems changes in blood pressure cough fast, irregular heartbeat feeling faint or lightheaded, falls fever or chills flushing, sweating, or hot feelings joint or muscle aches/pains seizures swelling of the ankles or feet unusually weak or tired Side effects that usually do not require medical attention (report to yourdoctor or health care professional if they continue or are bothersome): diarrhea feeling achy headache irritation at site where injected nausea, vomiting stomach upset tiredness This list may not describe all possible side effects. Call your doctor for medical advice about side effects. You may report side effects to FDA at1-800-FDA-1088. Where should I keep my medication? This drug is given in a hospital or clinic and will not be stored at home. NOTE: This sheet is a summary. It may not cover all possible information. If you have questions about this medicine, talk to your doctor, pharmacist, orhealth care provider.  2022 Elsevier/Gold Standard (2011-04-09 17:14:35)  

## 2021-02-21 ENCOUNTER — Telehealth: Payer: Self-pay | Admitting: Gastroenterology

## 2021-02-21 NOTE — Telephone Encounter (Signed)
Pt is wanting to know if you are willing to sign short term disability forms? Please see message below

## 2021-02-21 NOTE — Telephone Encounter (Signed)
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 Will you fill out paperwork to continue his short term disability?

## 2021-02-24 ENCOUNTER — Other Ambulatory Visit: Payer: Self-pay

## 2021-02-24 ENCOUNTER — Ambulatory Visit (INDEPENDENT_AMBULATORY_CARE_PROVIDER_SITE_OTHER): Payer: BC Managed Care – PPO | Admitting: Gastroenterology

## 2021-02-24 ENCOUNTER — Encounter: Payer: Self-pay | Admitting: Gastroenterology

## 2021-02-24 VITALS — BP 159/92 | HR 81 | Temp 97.9°F | Ht 67.0 in | Wt 164.2 lb

## 2021-02-24 DIAGNOSIS — D500 Iron deficiency anemia secondary to blood loss (chronic): Secondary | ICD-10-CM | POA: Diagnosis not present

## 2021-02-24 MED ORDER — PANTOPRAZOLE SODIUM 40 MG PO TBEC: 40.0000 mg | DELAYED_RELEASE_TABLET | Freq: Two times a day (BID) | ORAL | 0 refills | Status: DC

## 2021-02-24 NOTE — Progress Notes (Signed)
Jonathon Bellows MD, MRCP(U.K) [address]  [place]  [place], [place] [address]  Main: [phone]  Fax: [phone]   Primary Care Physician: Adin Hector, MD  Primary Gastroenterologist:  Dr. Jonathon Bellows   Chief Complaint  Patient presents with   Anemia    HPI: Jesse Ray is a 61 y.o. male   Summary of history : He was initially seen at the hospital and admitted on 01/18/2021 by Dr. Allen Norris.  After he was found to be anemic and sent to the ER for abnormal labs when he was seen by his physician for shortness of breath tight chest.  He had had intermittent tarry black stools and was found to have hemoglobin of 7.5 with severe iron deficiency.  Due to blood transfusion.  He had been on Plavix.  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  He subsequently also underwent a capsule study of the small bowel which showed no abnormalities he had been seen by Dr. Tasia Catchings and received IV iron..  02/11/2021 hemoglobin 10.6 g with an MCV of 83.  Creatinine is 1.53 Biopsies of the small bowel showed no abnormalities.  Colon polyps were tubular adenomas   He is doing well no other complaints.  Informed him that the capsule study showed no abnormalities.  Current Outpatient Medications  Medication Sig Dispense Refill   acetaminophen (TYLENOL) 325 MG tablet Take 2 tablets (650 mg total) by mouth every 4 (four) hours as needed for headache or mild pain.     Adalimumab 40 MG/0.8ML PNKT Inject 0.4 mLs into the skin every 14 (fourteen) days.      albuterol (VENTOLIN HFA) 108 (90 Base) MCG/ACT inhaler SMARTSIG:2 Puff(s) By Mouth Every 6 Hours PRN     ascorbic acid (VITAMIN C) 1000 MG tablet Take 1,000 mg by mouth daily.      aspirin 81 MG tablet Take 81 mg by mouth daily.     cetirizine (ZYRTEC) 5 MG tablet Take 10 mg by mouth daily.     Cholecalciferol 25 MCG (1000 UT) tablet Take 1,000  Units by mouth daily.      clobetasol cream (TEMOVATE) 7.98 % Apply 1 application topically 2 (two) times daily as needed (Psoriasis).      exenatide (BYETTA 10 MCG PEN) 10 MCG/0.04ML SOPN injection Inject 10 mcg into the skin 2 (two) times daily with a meal. 2.4 ml=1 pen     glimepiride (AMARYL) 4 MG tablet Take 4 mg by mouth in the morning and at bedtime.     metFORMIN (GLUCOPHAGE) 1000 MG tablet Take 1 tablet (1,000 mg total) by mouth 2 (two) times daily with a meal.     metoprolol succinate (TOPROL-XL) 25 MG 24 hr tablet Take 1 tablet (25 mg total) by mouth daily. 90 tablet 3   nitroGLYCERIN (NITROSTAT) 0.4 MG SL tablet Place 1 tablet (0.4 mg total) under the tongue every 5 (five) minutes as needed for chest pain. 25 tablet 4   pantoprazole (PROTONIX) 40 MG tablet Take 1 tablet (40 mg total) by mouth 2 (two) times daily. 180 tablet 0   rosuvastatin (CRESTOR) 40 MG tablet Take 1 tablet (40 mg total) by mouth at bedtime. 30 tablet 11   vitamin E 180 MG (400 UNITS) capsule Take 400 Units by mouth daily.      Zinc 30 MG TABS Take 25 mg by mouth daily.  Current Facility-Administered Medications  Medication Dose Route Frequency Provider Last Rate Last Admin   sodium chloride flush (NS) 0.9 % injection 3 mL  3 mL Intravenous Q12H Kate Sable, MD        Allergies as of 02/24/2021 - Review Complete 02/24/2021  Allergen Reaction Noted   Atorvastatin  10/18/2017    ROS:  General: Negative for anorexia, weight loss, fever, chills, fatigue, weakness. ENT: Negative for hoarseness, difficulty swallowing , nasal congestion. CV: Negative for chest pain, angina, palpitations, dyspnea on exertion, peripheral edema.  Respiratory: Negative for dyspnea at rest, dyspnea on exertion, cough, sputum, wheezing.  GI: See history of present illness. GU:  Negative for dysuria, hematuria, urinary incontinence, urinary frequency, nocturnal urination.  Endo: Negative for unusual weight change.     Physical Examination:   BP (!) 159/92   Pulse 81   Temp 97.9 F (36.6 C) (Oral)   Ht 5\' 7"  (1.702 m)   Wt 164 lb 3.2 oz (74.5 kg)   BMI 25.72 kg/m   General: Well-nourished, well-developed in no acute distress.  Eyes: No icterus. Conjunctivae pink. Neuro: Alert and oriented x 3.  Grossly intact. Skin: Warm and dry, no jaundice.   Psych: Alert and cooperative, normal mood and affect.   Imaging Studies: No results found.  Assessment and Plan:   Jesse Ray is a 60 y.o. 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  Colonoscopy showed multiple small tubular adenomas that were resected.  Capsule study of the small bowel showed no abnormalities.  Receiving IV iron by Dr. Tasia Catchings in hematology and has noted an improvement in the hemoglobin to 10.3 g.  MCV to has improved.  No recent B12 or folate levels.  From the GI point of view no further evaluation required at this point of time as long as hemoglobin is improving.  Would recommend checking B12 and folate levels.  Very likely had anemia due to bleeding from small bowel AVMs while being on Plavix.    Dr Jonathon Bellows  MD,MRCP [facility]) Follow up in as needed

## 2021-02-24 NOTE — Telephone Encounter (Signed)
Dr. Vicente Males, you see this patient today.

## 2021-02-24 NOTE — Telephone Encounter (Signed)
Called patient and informed him what Dr. Vicente Males stated. He stated that he would let his PCP know about his extreme fatigue. Patient had no further questions.

## 2021-02-25 LAB — B12 AND FOLATE PANEL
Folate: 12 ng/mL (ref >3.0)
Vitamin B-12: 264 pg/mL (ref 232–1245)

## 2021-02-27 ENCOUNTER — Inpatient Hospital Stay: Payer: BC Managed Care – PPO

## 2021-02-27 ENCOUNTER — Other Ambulatory Visit: Payer: Self-pay

## 2021-02-27 VITALS — BP 156/85 | HR 61 | Temp 98.2°F | Resp 18

## 2021-02-27 DIAGNOSIS — D500 Iron deficiency anemia secondary to blood loss (chronic): Secondary | ICD-10-CM

## 2021-02-27 DIAGNOSIS — D509 Iron deficiency anemia, unspecified: Secondary | ICD-10-CM | POA: Diagnosis not present

## 2021-02-27 MED ORDER — SODIUM CHLORIDE 0.9 % IV SOLN: 200.0000 mg | Freq: Once | INTRAVENOUS | Status: DC

## 2021-02-27 MED ORDER — SODIUM CHLORIDE 0.9 % IV SOLN
Freq: Once | INTRAVENOUS | Status: AC
  Filled 2021-02-27: qty 250

## 2021-02-27 MED ORDER — IRON SUCROSE 20 MG/ML IV SOLN
200.0000 mg | Freq: Once | INTRAVENOUS | Status: AC
  Administered 2021-02-27: 200 mg via INTRAVENOUS
  Filled 2021-02-27: qty 10

## 2021-02-27 NOTE — Patient Instructions (Signed)
[facility]  Discharge Instructions: Thank you for choosing [facility] to provide your oncology and hematology care.  If you have a lab appointment with the Cancer Center, please go directly to the Cancer Center and check in at the registration area.  Wear comfortable clothing and clothing appropriate for easy access to any Portacath or PICC line.   We strive to give you quality time with your provider. You may need to reschedule your appointment if you arrive late (15 or more minutes).  Arriving late affects you and other patients whose appointments are after yours.  Also, if you miss three or more appointments without notifying the office, you may be dismissed from the clinic at the provider's discretion.      For prescription refill requests, have your pharmacy contact our office and allow 72 hours for refills to be completed.    Today you received the following chemotherapy and/or immunotherapy agents VENOFER      To help prevent nausea and vomiting after your treatment, we encourage you to take your nausea medication as directed.  BELOW ARE SYMPTOMS THAT SHOULD BE REPORTED IMMEDIATELY: *FEVER GREATER THAN 100.4 F (38 C) OR HIGHER *CHILLS OR SWEATING *NAUSEA AND VOMITING THAT IS NOT CONTROLLED WITH YOUR NAUSEA MEDICATION *UNUSUAL SHORTNESS OF BREATH *UNUSUAL BRUISING OR BLEEDING *URINARY PROBLEMS (pain or burning when urinating, or frequent urination) *BOWEL PROBLEMS (unusual diarrhea, constipation, pain near the anus) TENDERNESS IN MOUTH AND THROAT WITH OR WITHOUT PRESENCE OF ULCERS (sore throat, sores in mouth, or a toothache) UNUSUAL RASH, SWELLING OR PAIN  UNUSUAL VAGINAL DISCHARGE OR ITCHING   Items with * indicate a potential emergency and should be followed up as soon as possible or go to the Emergency Department if any problems should occur.  Please show the CHEMOTHERAPY ALERT CARD or IMMUNOTHERAPY ALERT CARD at check-in to  the Emergency Department and triage nurse.  Should you have questions after your visit or need to cancel or reschedule your appointment, please contact [facility]  [phone] and follow the prompts.  Office hours are 8:00 a.m. to 4:30 p.m. Monday - Friday. Please note that voicemails left after 4:00 p.m. may not be returned until the following business day.  We are closed weekends and major holidays. You have access to a nurse at all times for urgent questions. Please call the main number to the clinic [phone] and follow the prompts.  For any non-urgent questions, you may also contact your provider using MyChart. We now offer e-Visits for anyone 18 and older to request care online for non-urgent symptoms. For details visit [email or web address].   Also download the MyChart app! Go to the app store, search "MyChart", open the app, select [facility], and log in with your MyChart username and password.  Due to Covid, a mask is required upon entering the hospital/clinic. If you do not have a mask, one will be given to you upon arrival. For doctor visits, patients may have 1 support person aged 18 or older with them. For treatment visits, patients cannot have anyone with them due to current Covid guidelines and our immunocompromised population.   Iron Sucrose injection What is this medication? IRON SUCROSE (AHY ern SOO krohs) is an iron complex. Iron is used to make healthy red blood cells, which carry oxygen and nutrients throughout the body. This medicine is used to treat iron deficiency anemia in people with chronickidney disease. This medicine may be used for other   purposes; ask your health care provider orpharmacist if you have questions. COMMON BRAND NAME(S): Venofer What should I tell my care team before I take this medication? They need to know if you have any of these conditions: anemia not caused by low iron levels heart disease high levels of  iron in the blood kidney disease liver disease an unusual or allergic reaction to iron, other medicines, foods, dyes, or preservatives pregnant or trying to get pregnant breast-feeding How should I use this medication? This medicine is for infusion into a vein. It is given by a health careprofessional in a hospital or clinic setting. Talk to your pediatrician regarding the use of this medicine in children. While this drug may be prescribed for children as young as 2 years for selectedconditions, precautions do apply. Overdosage: If you think you have taken too much of this medicine contact apoison control center or emergency room at once. NOTE: This medicine is only for you. Do not share this medicine with others. What if I miss a dose? It is important not to miss your dose. Call your doctor or health careprofessional if you are unable to keep an appointment. What may interact with this medication? Do not take this medicine with any of the following medications: deferoxamine dimercaprol other iron products This medicine may also interact with the following medications: chloramphenicol deferasirox This list may not describe all possible interactions. Give your health care provider a list of all the medicines, herbs, non-prescription drugs, or dietary supplements you use. Also tell them if you smoke, drink alcohol, or use illegaldrugs. Some items may interact with your medicine. What should I watch for while using this medication? Visit your doctor or healthcare professional regularly. Tell your doctor or healthcare professional if your symptoms do not start to get better or if theyget worse. You may need blood work done while you are taking this medicine. You may need to follow a special diet. Talk to your doctor. Foods that contain iron include: whole grains/cereals, dried fruits, beans, or peas, leafy greenvegetables, and organ meats (liver, kidney). What side effects may I notice from  receiving this medication? Side effects that you should report to your doctor or health care professionalas soon as possible: allergic reactions like skin rash, itching or hives, swelling of the face, lips, or tongue breathing problems changes in blood pressure cough fast, irregular heartbeat feeling faint or lightheaded, falls fever or chills flushing, sweating, or hot feelings joint or muscle aches/pains seizures swelling of the ankles or feet unusually weak or tired Side effects that usually do not require medical attention (report to yourdoctor or health care professional if they continue or are bothersome): diarrhea feeling achy headache irritation at site where injected nausea, vomiting stomach upset tiredness This list may not describe all possible side effects. Call your doctor for medical advice about side effects. You may report side effects to FDA at1-800-FDA-1088. Where should I keep my medication? This drug is given in a hospital or clinic and will not be stored at home. NOTE: This sheet is a summary. It may not cover all possible information. If you have questions about this medicine, talk to your doctor, pharmacist, orhealth care provider.  2022 Elsevier/Gold Standard (2011-04-09 17:14:35)  

## 2021-03-06 ENCOUNTER — Inpatient Hospital Stay: Payer: BC Managed Care – PPO

## 2021-03-06 VITALS — BP 116/77 | HR 71 | Temp 96.2°F | Resp 20

## 2021-03-06 DIAGNOSIS — D500 Iron deficiency anemia secondary to blood loss (chronic): Secondary | ICD-10-CM

## 2021-03-06 DIAGNOSIS — D509 Iron deficiency anemia, unspecified: Secondary | ICD-10-CM | POA: Diagnosis not present

## 2021-03-06 MED ORDER — SODIUM CHLORIDE 0.9 % IV SOLN: 200.0000 mg | Freq: Once | INTRAVENOUS | Status: DC

## 2021-03-06 MED ORDER — IRON SUCROSE 20 MG/ML IV SOLN
200.0000 mg | Freq: Once | INTRAVENOUS | Status: AC
  Administered 2021-03-06: 200 mg via INTRAVENOUS
  Filled 2021-03-06: qty 10

## 2021-03-06 MED ORDER — SODIUM CHLORIDE 0.9 % IV SOLN
Freq: Once | INTRAVENOUS | Status: AC
  Filled 2021-03-06: qty 250

## 2021-03-06 NOTE — Patient Instructions (Signed)

## 2021-03-21 ENCOUNTER — Telehealth: Payer: Self-pay

## 2021-03-21 NOTE — Telephone Encounter (Signed)
Called patient but had to leave a detailed message with Dr. Georgeann Oppenheim recommendation. I will also send him a MyChart message.

## 2021-03-21 NOTE — Telephone Encounter (Signed)
-----   Message from Jonathon Bellows, MD sent at 03/20/2021  2:13 PM EDT ----- Herb Grays inform patient that his B12 level is low at 264.  Could be contributing to the anemia.  Commence on B12 oral tablets 1000 mcg/day.  Continue to follow-up with Dr. Tasia Catchings  Dr Jonathon Bellows MD,MRCP [facility]) Gastroenterology/Hepatology Pager: [phone]

## 2021-03-27 ENCOUNTER — Ambulatory Visit: Payer: BC Managed Care – PPO | Admitting: Gastroenterology

## 2021-03-29 ENCOUNTER — Other Ambulatory Visit: Payer: Self-pay | Admitting: Cardiology

## 2021-04-04 ENCOUNTER — Ambulatory Visit: Payer: BC Managed Care – PPO | Admitting: Cardiology

## 2021-04-15 ENCOUNTER — Encounter: Payer: Self-pay | Admitting: Cardiology

## 2021-04-15 ENCOUNTER — Other Ambulatory Visit: Payer: Self-pay

## 2021-04-15 ENCOUNTER — Ambulatory Visit (INDEPENDENT_AMBULATORY_CARE_PROVIDER_SITE_OTHER): Payer: BC Managed Care – PPO | Admitting: Cardiology

## 2021-04-15 DIAGNOSIS — I2510 Atherosclerotic heart disease of native coronary artery without angina pectoris: Secondary | ICD-10-CM | POA: Diagnosis not present

## 2021-04-15 DIAGNOSIS — I10 Essential (primary) hypertension: Secondary | ICD-10-CM | POA: Diagnosis not present

## 2021-04-15 DIAGNOSIS — E785 Hyperlipidemia, unspecified: Secondary | ICD-10-CM | POA: Diagnosis not present

## 2021-04-15 NOTE — Patient Instructions (Signed)
Medication Instructions:  Your physician recommends that you continue on your current medications as directed. Please refer to the Current Medication list given to you today.  *If you need a refill on your cardiac medications before your next appointment, please call your pharmacy*   Lab Work: None ordered If you have labs (blood work) drawn today and your tests are completely normal, you will receive your results only by: [place] (if you have MyChart) OR A paper copy in the mail If you have any lab test that is abnormal or we need to change your treatment, we will call you to review the results.   Testing/Procedures: None ordered   Follow-Up: At [facility], you and your health needs are our priority.  As part of our continuing mission to provide you with exceptional heart care, we have created designated Provider Care Teams.  These Care Teams include your primary Cardiologist (physician) and Advanced Practice Providers (APPs -  Physician Assistants and Nurse Practitioners) who all work together to provide you with the care you need, when you need it.  We recommend signing up for the patient portal called "MyChart".  Sign up information is provided on this After Visit Summary.  MyChart is used to connect with patients for Virtual Visits (Telemedicine).  Patients are able to view lab/test results, encounter notes, upcoming appointments, etc.  Non-urgent messages can be sent to your provider as well.   To learn more about what you can do with MyChart, go to [email or web address].    Your next appointment:   6-12 month(s)  The format for your next appointment:   In Person  Provider:   Kate Sable, MD   Other Instructions

## 2021-04-15 NOTE — Progress Notes (Signed)
Cardiology Office Note:    Date:  04/15/2021   ID:  Jesse Ray, DOB 22-Aug-1960, MRN [id number]  PCP:  Adin Hector, MD  [facility] Cardiologist:  Kate Sable, MD  [place] Electrophysiologist:  None   Referring MD: Adin Hector, MD   Chief Complaint  Patient presents with   Other    2 month follow up -- Meds reviewed verbally with patient.     History of Present Illness:    Jesse Ray is a 60 y.o. male with a hx of CAD s/p PCI x 2 to LAD 03/2020, diabetes, hypertension, hyperlipidemia, prior smoker x20 years, iron deficiency anemia (s/p APC duodenum, colon tubular adenomas) who presents for follow-up.    Last seen with chest discomfort or shortness of breath, left heart cath was originally planned, lab work did not reveal anemia with hemoglobin as low as 7.1.  Admitted to the hospital and transfused.  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  He has since established care with hematology, receiving IV iron therapy.  Last hemoglobin 4 weeks ago improved her 10.9.  Plavix was held, aspirin 81 mg continued.  He denies any dark stools, states feeling much better with improvement in hemoglobin.  Denies chest pain or shortness of breath.   Prior notes Left heart cath 04/10/2020 status post PCI x2 to proximal and mid LAD.  Proximal RCA 85% small nondominant artery, diagonal to 60% Echocardiogram 2/69/4854 normal systolic and diastolic function, EF 62% ETT 06/28/2020 no evidence for inducible ischemia Not tolerant to Imdur due to headaches and worsening shortness of breath.  Past Medical History:  Diagnosis Date   Anginal pain ([place])    Coronary artery disease    Diabetes mellitus without complication ([place])    HLD (hyperlipidemia) 04/11/2020   Hyperlipidemia    Hypertension    IDA (iron deficiency anemia) 02/11/2021    Psoriatic arthritis ([place])    S/P angioplasty with stent 04/10/30 of proximal and mLAD DES and residual LAD stenosis 04/11/2020    Past Surgical History:  Procedure Laterality Date   COLONOSCOPY WITH PROPOFOL N/A 01/23/2021   Procedure: COLONOSCOPY WITH PROPOFOL;  Surgeon: Jonathon Bellows, MD;  Location: [facility] ENDOSCOPY;  Service: Gastroenterology;  Laterality: N/A;   CORONARY STENT INTERVENTION N/A 04/10/2020   Procedure: CORONARY STENT INTERVENTION;  Surgeon: Wellington Hampshire, MD;  Location: [place] CV LAB;  Service: Cardiovascular;  Laterality: N/A;  LAD    ESOPHAGOGASTRODUODENOSCOPY N/A 01/23/2021   Procedure: ESOPHAGOGASTRODUODENOSCOPY (EGD);  Surgeon: Jonathon Bellows, MD;  Location: [facility] ENDOSCOPY;  Service: Gastroenterology;  Laterality: N/A;   GIVENS CAPSULE STUDY N/A 02/10/2021   Procedure: GIVENS CAPSULE STUDY;  Surgeon: Jonathon Bellows, MD;  Location: [facility] ENDOSCOPY;  Service: Gastroenterology;  Laterality: N/A;   INTRAVASCULAR ULTRASOUND/IVUS N/A 04/10/2020   Procedure: Intravascular Ultrasound/IVUS;  Surgeon: Wellington Hampshire, MD;  Location: [place] CV LAB;  Service: Cardiovascular;  Laterality: N/A;  LAD   LEFT HEART CATH AND CORONARY ANGIOGRAPHY N/A 04/01/2020   Procedure: LEFT HEART CATH AND CORONARY ANGIOGRAPHY;  Surgeon: Wellington Hampshire, MD;  Location: [place] CV LAB;  Service: Cardiovascular;  Laterality: N/A;    Current Medications: Current Meds  Medication Sig   acetaminophen (TYLENOL) 325 MG tablet Take 2 tablets (650 mg total) by mouth every 4 (four) hours as needed for headache or mild pain.   Adalimumab 40 MG/0.8ML PNKT Inject 0.4 mLs  into the skin every 14 (fourteen) days.    albuterol (VENTOLIN HFA) 108 (90 Base) MCG/ACT inhaler SMARTSIG:2 Puff(s) By Mouth Every 6 Hours PRN   ascorbic acid (VITAMIN C) 1000 MG tablet Take 1,000 mg by mouth daily.    aspirin 81 MG tablet Take 81 mg by mouth daily.   cetirizine (ZYRTEC) 5 MG tablet Take 10 mg by mouth daily.    Cholecalciferol 25 MCG (1000 UT) tablet Take 1,000 Units by mouth daily.    clobetasol cream (TEMOVATE) 1.61 % Apply 1 application topically 2 (two) times daily as needed (Psoriasis).    exenatide (BYETTA 10 MCG PEN) 10 MCG/0.04ML SOPN injection Inject 10 mcg into the skin 2 (two) times daily with a meal. 2.4 ml=1 pen   glimepiride (AMARYL) 4 MG tablet Take 4 mg by mouth in the morning and at bedtime.   metFORMIN (GLUCOPHAGE) 1000 MG tablet Take 1 tablet (1,000 mg total) by mouth 2 (two) times daily with a meal.   metoprolol succinate (TOPROL-XL) 25 MG 24 hr tablet Take 1 tablet (25 mg total) by mouth daily.   nitroGLYCERIN (NITROSTAT) 0.4 MG SL tablet Place 1 tablet (0.4 mg total) under the tongue every 5 (five) minutes as needed for chest pain.   pantoprazole (PROTONIX) 40 MG tablet Take 1 tablet (40 mg total) by mouth 2 (two) times daily.   rosuvastatin (CRESTOR) 40 MG tablet TAKE 1 TABLET BY MOUTH AT BEDTIME   vitamin E 180 MG (400 UNITS) capsule Take 400 Units by mouth daily.    Zinc 30 MG TABS Take 25 mg by mouth daily.   Current Facility-Administered Medications for the 04/15/21 encounter (Office Visit) with Kate Sable, MD  Medication   sodium chloride flush (NS) 0.9 % injection 3 mL     Allergies:   Atorvastatin   Social History   Socioeconomic History   Marital status: Married    Spouse name: Not on file   Number of children: Not on file   Years of education: Not on file   Highest education level: Not on file  Occupational History   Not on file  Tobacco Use   Smoking status: Former    Years: 30.00    Types: Cigarettes    Quit date: 2009    Years since quitting: 13.7   Smokeless tobacco: Never  Vaping Use   Vaping Use: Never used  Substance and Sexual Activity   Alcohol use: Yes    Comment: occasional alcohol intake   Drug use: Never   Sexual activity: Yes  Other Topics Concern   Not on file  Social History Narrative   Not on file   Social Determinants  of Health   Financial Resource Strain: Not on file  Food Insecurity: Not on file  Transportation Needs: Not on file  Physical Activity: Not on file  Stress: Not on file  Social Connections: Not on file     Family History: Father heart and MI at age 32.  ROS:   Please see the history of present illness.     All other systems reviewed and are negative.  EKGs/Labs/Other Studies Reviewed:    The following studies were reviewed today:   EKG:  EKG is  ordered today.  The ekg ordered today demonstrates normal sinus rhythm, incomplete right bundle branch block  Recent Labs: 02/11/2021: ALT 40; BUN 26; Creatinine, Ser 1.53; Hemoglobin 10.6; Platelets 282; Potassium 5.4; Sodium 140  Recent Lipid Panel    Component Value Date/Time  CHOL 92 05/24/2020 1116   CHOL 169 03/28/2020 1025   TRIG 59 05/24/2020 1116   HDL 36 (L) 05/24/2020 1116   HDL 39 (L) 03/28/2020 1025   CHOLHDL 2.6 05/24/2020 1116   VLDL 12 05/24/2020 1116   LDLCALC 44 05/24/2020 1116   LDLCALC 96 03/28/2020 1025    Physical Exam:    VS:  BP 136/72 (BP Location: Left Arm, Patient Position: Sitting, Cuff Size: Normal)   Pulse 74   Ht 5\' 6"  (1.676 m)   Wt 167 lb (75.8 kg)   SpO2 96%   BMI 26.95 kg/m     Wt Readings from Last 3 Encounters:  04/15/21 167 lb (75.8 kg)  02/24/21 164 lb 3.2 oz (74.5 kg)  02/13/21 162 lb (73.5 kg)     GEN:  Well nourished, well developed in no acute distress HEENT: Normal NECK: No JVD; No carotid bruits LYMPHATICS: No lymphadenopathy CARDIAC: RRR, no murmurs, rubs, gallops RESPIRATORY:  Clear to auscultation without rales, wheezing or rhonchi  ABDOMEN: Soft, non-tender, non-distended MUSCULOSKELETAL:  No edema; No deformity  SKIN: Warm and dry NEUROLOGIC:  Alert and oriented x 3 PSYCHIATRIC:  Normal affect   ASSESSMENT:    1. Coronary artery disease involving native coronary artery of native heart, unspecified whether angina present   2. Primary hypertension   3.  Hyperlipidemia LDL goal <70     PLAN:    In order of problems listed above:  CAD/PCI x2 to LAD 03/2020, RCA 85%, not intervened on, small nondominant RCA.     Continue aspirin, Crestor, Toprol-XL .  Previous chest pain likely anemia induced.  Currently denies chest pain or shortness of breath.  Left heart cath canceled. Hypertension, BP controlled.  Toprol-XL, Exforge Hyperlipidemia, LDL at goal.  Continue Crestor  Follow-up in 6 to 12 months.   Medication Adjustments/Labs and Tests Ordered: Current medicines are reviewed at length with the patient today.  Concerns regarding medicines are outlined above.  Orders Placed This Encounter  Procedures   EKG 12-Lead      No orders of the defined types were placed in this encounter.     Patient Instructions  Medication Instructions:  Your physician recommends that you continue on your current medications as directed. Please refer to the Current Medication list given to you today.  *If you need a refill on your cardiac medications before your next appointment, please call your pharmacy*   Lab Work: None ordered If you have labs (blood work) drawn today and your tests are completely normal, you will receive your results only by: [place] (if you have MyChart) OR A paper copy in the mail If you have any lab test that is abnormal or we need to change your treatment, we will call you to review the results.   Testing/Procedures: None ordered   Follow-Up: At [facility], you and your health needs are our priority.  As part of our continuing mission to provide you with exceptional heart care, we have created designated Provider Care Teams.  These Care Teams include your primary Cardiologist (physician) and Advanced Practice Providers (APPs -  Physician Assistants and Nurse Practitioners) who all work together to provide you with the care you need, when you need it.  We recommend signing up for the patient portal called  "MyChart".  Sign up information is provided on this After Visit Summary.  MyChart is used to connect with patients for Virtual Visits (Telemedicine).  Patients are able to view lab/test results, encounter  notes, upcoming appointments, etc.  Non-urgent messages can be sent to your provider as well.   To learn more about what you can do with MyChart, go to [email or web address].    Your next appointment:   6-12 month(s)  The format for your next appointment:   In Person  Provider:   Kate Sable, MD   Other Instructions    Signed, Kate Sable, MD  04/15/2021 10:33 AM    [place]

## 2021-05-14 ENCOUNTER — Inpatient Hospital Stay: Payer: BC Managed Care – PPO | Attending: Oncology

## 2021-05-14 ENCOUNTER — Other Ambulatory Visit: Payer: Self-pay

## 2021-05-14 DIAGNOSIS — D509 Iron deficiency anemia, unspecified: Secondary | ICD-10-CM | POA: Insufficient documentation

## 2021-05-14 DIAGNOSIS — I10 Essential (primary) hypertension: Secondary | ICD-10-CM | POA: Insufficient documentation

## 2021-05-14 DIAGNOSIS — R5383 Other fatigue: Secondary | ICD-10-CM | POA: Diagnosis not present

## 2021-05-14 DIAGNOSIS — Z8249 Family history of ischemic heart disease and other diseases of the circulatory system: Secondary | ICD-10-CM | POA: Insufficient documentation

## 2021-05-14 DIAGNOSIS — Z808 Family history of malignant neoplasm of other organs or systems: Secondary | ICD-10-CM | POA: Insufficient documentation

## 2021-05-14 DIAGNOSIS — E785 Hyperlipidemia, unspecified: Secondary | ICD-10-CM | POA: Insufficient documentation

## 2021-05-14 DIAGNOSIS — Z79899 Other long term (current) drug therapy: Secondary | ICD-10-CM | POA: Diagnosis not present

## 2021-05-14 DIAGNOSIS — Z818 Family history of other mental and behavioral disorders: Secondary | ICD-10-CM | POA: Diagnosis not present

## 2021-05-14 DIAGNOSIS — N1831 Chronic kidney disease, stage 3a: Secondary | ICD-10-CM | POA: Diagnosis not present

## 2021-05-14 DIAGNOSIS — D500 Iron deficiency anemia secondary to blood loss (chronic): Secondary | ICD-10-CM

## 2021-05-14 DIAGNOSIS — Z87891 Personal history of nicotine dependence: Secondary | ICD-10-CM | POA: Insufficient documentation

## 2021-05-14 LAB — CBC WITH DIFFERENTIAL/PLATELET
Abs Immature Granulocytes: 0.06 10*3/uL (ref 0.00–0.07)
Basophils Absolute: 0.1 10*3/uL (ref 0.0–0.1)
Basophils Relative: 1 %
Eosinophils Absolute: 0.6 10*3/uL — ABNORMAL HIGH (ref 0.0–0.5)
Eosinophils Relative: 8 %
HCT: 35.7 % — ABNORMAL LOW (ref 39.0–52.0)
Hemoglobin: 11.8 g/dL — ABNORMAL LOW (ref 13.0–17.0)
Immature Granulocytes: 1 %
Lymphocytes Relative: 27 %
Lymphs Abs: 2.1 10*3/uL (ref 0.7–4.0)
MCH: 29.3 pg (ref 26.0–34.0)
MCHC: 33.1 g/dL (ref 30.0–36.0)
MCV: 88.6 fL (ref 80.0–100.0)
Monocytes Absolute: 0.7 10*3/uL (ref 0.1–1.0)
Monocytes Relative: 9 %
Neutro Abs: 4.1 10*3/uL (ref 1.7–7.7)
Neutrophils Relative %: 54 %
Platelets: 268 10*3/uL (ref 150–400)
RBC: 4.03 MIL/uL — ABNORMAL LOW (ref 4.22–5.81)
RDW: 14.3 % (ref 11.5–15.5)
WBC: 7.5 10*3/uL (ref 4.0–10.5)
nRBC: 0 % (ref 0.0–0.2)

## 2021-05-14 LAB — IRON AND TIBC
Iron: 51 ug/dL (ref 45–182)
Saturation Ratios: 11 % — ABNORMAL LOW (ref 17.9–39.5)
TIBC: 456 ug/dL — ABNORMAL HIGH (ref 250–450)
UIBC: 405 ug/dL

## 2021-05-14 LAB — FERRITIN: Ferritin: 23 ng/mL — ABNORMAL LOW (ref 24–336)

## 2021-05-16 ENCOUNTER — Ambulatory Visit: Payer: BC Managed Care – PPO

## 2021-05-16 ENCOUNTER — Inpatient Hospital Stay (HOSPITAL_BASED_OUTPATIENT_CLINIC_OR_DEPARTMENT_OTHER): Payer: BC Managed Care – PPO | Admitting: Oncology

## 2021-05-16 ENCOUNTER — Encounter: Payer: Self-pay | Admitting: Oncology

## 2021-05-16 DIAGNOSIS — D631 Anemia in chronic kidney disease: Secondary | ICD-10-CM | POA: Diagnosis not present

## 2021-05-16 DIAGNOSIS — D500 Iron deficiency anemia secondary to blood loss (chronic): Secondary | ICD-10-CM

## 2021-05-16 DIAGNOSIS — N1831 Chronic kidney disease, stage 3a: Secondary | ICD-10-CM | POA: Diagnosis not present

## 2021-05-16 DIAGNOSIS — D509 Iron deficiency anemia, unspecified: Secondary | ICD-10-CM | POA: Diagnosis not present

## 2021-05-16 NOTE — Progress Notes (Signed)
Pt contacted for Mychart visit. No new concerns voiced.  

## 2021-05-16 NOTE — Progress Notes (Signed)
HEMATOLOGY-ONCOLOGY TeleHEALTH VISIT PROGRESS NOTE  I connected with Jesse Ray on 05/16/21  at 10:00 AM EDT by video enabled telemedicine visit and verified that I am speaking with the correct person using two identifiers. I discussed the limitations, risks, security and privacy concerns of performing an evaluation and management service by telemedicine and the availability of in-person appointments. The patient expressed understanding and agreed to proceed.   Other persons participating in the visit and their role in the encounter:  None  Patient's location: Home  Provider's location: office Chief Complaint: Follow-up for iron deficiency anemia.   INTERVAL HISTORY Jesse Ray is a 60 y.o. male who has above history reviewed by me today presents for follow up visit for iron deficiency anemia Patient has received IV Venofer treatments.  Tolerates well.  No new complaints. Fatigue has slightly improved.  Review of Systems  Constitutional:  Positive for fatigue. Negative for appetite change, chills, fever and unexpected weight change.  HENT:   Negative for hearing loss and voice change.   Eyes:  Negative for eye problems and icterus.  Respiratory:  Negative for chest tightness, cough and shortness of breath.   Cardiovascular:  Negative for chest pain and leg swelling.  Gastrointestinal:  Negative for abdominal distention and abdominal pain.  Endocrine: Negative for hot flashes.  Genitourinary:  Negative for difficulty urinating, dysuria and frequency.   Musculoskeletal:  Negative for arthralgias.  Skin:  Negative for itching and rash.  Neurological:  Negative for light-headedness and numbness.  Hematological:  Negative for adenopathy. Does not bruise/bleed easily.  Psychiatric/Behavioral:  Negative for confusion.    Past Medical History:  Diagnosis Date   Anginal pain ([place])    Coronary artery disease    Diabetes mellitus without complication ([place])    HLD (hyperlipidemia)  04/11/2020   Hyperlipidemia    Hypertension    IDA (iron deficiency anemia) 02/11/2021   Psoriatic arthritis ([place])    S/P angioplasty with stent 04/10/30 of proximal and mLAD DES and residual LAD stenosis 04/11/2020   Past Surgical History:  Procedure Laterality Date   COLONOSCOPY WITH PROPOFOL N/A 01/23/2021   Procedure: COLONOSCOPY WITH PROPOFOL;  Surgeon: Jonathon Bellows, MD;  Location: [facility] ENDOSCOPY;  Service: Gastroenterology;  Laterality: N/A;   CORONARY STENT INTERVENTION N/A 04/10/2020   Procedure: CORONARY STENT INTERVENTION;  Surgeon: Wellington Hampshire, MD;  Location: [place] CV LAB;  Service: Cardiovascular;  Laterality: N/A;  LAD    ESOPHAGOGASTRODUODENOSCOPY N/A 01/23/2021   Procedure: ESOPHAGOGASTRODUODENOSCOPY (EGD);  Surgeon: Jonathon Bellows, MD;  Location: [facility] ENDOSCOPY;  Service: Gastroenterology;  Laterality: N/A;   GIVENS CAPSULE STUDY N/A 02/10/2021   Procedure: GIVENS CAPSULE STUDY;  Surgeon: Jonathon Bellows, MD;  Location: [facility] ENDOSCOPY;  Service: Gastroenterology;  Laterality: N/A;   INTRAVASCULAR ULTRASOUND/IVUS N/A 04/10/2020   Procedure: Intravascular Ultrasound/IVUS;  Surgeon: Wellington Hampshire, MD;  Location: [place] CV LAB;  Service: Cardiovascular;  Laterality: N/A;  LAD   LEFT HEART CATH AND CORONARY ANGIOGRAPHY N/A 04/01/2020   Procedure: LEFT HEART CATH AND CORONARY ANGIOGRAPHY;  Surgeon: Wellington Hampshire, MD;  Location: [place] CV LAB;  Service: Cardiovascular;  Laterality: N/A;    Family History  Problem Relation Age of Onset   Dementia Mother    Heart disease Father    Throat cancer Maternal Grandfather     Social History   Socioeconomic History   Marital status: Married    Spouse name: Not on file   Number of children: Not on file   Years  of education: Not on file   Highest education level: Not on file  Occupational History   Not on file  Tobacco Use   Smoking status: Former    Years: 30.00    Types: Cigarettes    Quit date: 2009    Years  since quitting: 13.8   Smokeless tobacco: Never  Vaping Use   Vaping Use: Never used  Substance and Sexual Activity   Alcohol use: Yes    Comment: occasional alcohol intake   Drug use: Never   Sexual activity: Yes  Other Topics Concern   Not on file  Social History Narrative   Not on file   Social Determinants of Health   Financial Resource Strain: Not on file  Food Insecurity: Not on file  Transportation Needs: Not on file  Physical Activity: Not on file  Stress: Not on file  Social Connections: Not on file  Intimate Partner Violence: Not on file    Current Outpatient Medications on File Prior to Visit  Medication Sig Dispense Refill   acetaminophen (TYLENOL) 325 MG tablet Take 2 tablets (650 mg total) by mouth every 4 (four) hours as needed for headache or mild pain.     Adalimumab 40 MG/0.8ML PNKT Inject 0.4 mLs into the skin every 14 (fourteen) days.      albuterol (VENTOLIN HFA) 108 (90 Base) MCG/ACT inhaler SMARTSIG:2 Puff(s) By Mouth Every 6 Hours PRN     ascorbic acid (VITAMIN C) 1000 MG tablet Take 1,000 mg by mouth daily.      aspirin 81 MG tablet Take 81 mg by mouth daily.     cetirizine (ZYRTEC) 5 MG tablet Take 10 mg by mouth daily.     Cholecalciferol 25 MCG (1000 UT) tablet Take 1,000 Units by mouth daily.      clobetasol cream (TEMOVATE) 0.63 % Apply 1 application topically 2 (two) times daily as needed (Psoriasis).      exenatide (BYETTA 10 MCG PEN) 10 MCG/0.04ML SOPN injection Inject 10 mcg into the skin 2 (two) times daily with a meal. 2.4 ml=1 pen     glimepiride (AMARYL) 4 MG tablet Take 4 mg by mouth in the morning and at bedtime.     metFORMIN (GLUCOPHAGE) 1000 MG tablet Take 1 tablet (1,000 mg total) by mouth 2 (two) times daily with a meal.     metoprolol succinate (TOPROL-XL) 25 MG 24 hr tablet Take 1 tablet (25 mg total) by mouth daily. 90 tablet 3   pantoprazole (PROTONIX) 40 MG tablet Take 1 tablet (40 mg total) by mouth 2 (two) times daily. 180  tablet 0   rosuvastatin (CRESTOR) 40 MG tablet TAKE 1 TABLET BY MOUTH AT BEDTIME 90 tablet 2   vitamin E 180 MG (400 UNITS) capsule Take 400 Units by mouth daily.      Zinc 30 MG TABS Take 25 mg by mouth daily.     nitroGLYCERIN (NITROSTAT) 0.4 MG SL tablet Place 1 tablet (0.4 mg total) under the tongue every 5 (five) minutes as needed for chest pain. (Patient not taking: Reported on 05/16/2021) 25 tablet 4   Current Facility-Administered Medications on File Prior to Visit  Medication Dose Route Frequency Provider Last Rate Last Admin   sodium chloride flush (NS) 0.9 % injection 3 mL  3 mL Intravenous Q12H Kate Sable, MD        Allergies  Allergen Reactions   Atorvastatin     Other reaction(s): Muscle Pain       Observations/Objective:  Today's Vitals   05/16/21 0953  PainSc: 0-No pain   There is no height or weight on file to calculate BMI.  Physical Exam Neurological:     Mental Status: He is alert.    CBC    Component Value Date/Time   WBC 7.5 05/14/2021 0813   RBC 4.03 (L) 05/14/2021 0813   HGB 11.8 (L) 05/14/2021 0813   HGB 9.8 (L) 01/23/2021 1343   HCT 35.7 (L) 05/14/2021 0813   HCT 31.8 (L) 01/23/2021 1343   PLT 268 05/14/2021 0813   PLT 281 01/23/2021 1343   MCV 88.6 05/14/2021 0813   MCV 82 01/23/2021 1343   MCH 29.3 05/14/2021 0813   MCHC 33.1 05/14/2021 0813   RDW 14.3 05/14/2021 0813   RDW 16.2 (H) 01/23/2021 1343   LYMPHSABS 2.1 05/14/2021 0813   MONOABS 0.7 05/14/2021 0813   EOSABS 0.6 (H) 05/14/2021 0813   BASOSABS 0.1 05/14/2021 0813    CMP     Component Value Date/Time   NA 140 02/11/2021 1056   NA 146 (H) 01/17/2021 0858   K 5.4 (H) 02/11/2021 1056   CL 104 02/11/2021 1056   CO2 27 02/11/2021 1056   GLUCOSE 124 (H) 02/11/2021 1056   BUN 26 (H) 02/11/2021 1056   BUN 24 01/17/2021 0858   CREATININE 1.53 (H) 02/11/2021 1056   CALCIUM 9.5 02/11/2021 1056   PROT 7.7 02/11/2021 1056   ALBUMIN 4.2 02/11/2021 1056   AST 46 (H)  02/11/2021 1056   ALT 40 02/11/2021 1056   ALKPHOS 87 02/11/2021 1056   BILITOT 0.2 (L) 02/11/2021 1056   GFRNONAA 52 (L) 02/11/2021 1056   GFRAA >60 04/11/2020 0213     Assessment and Plan: 1. Iron deficiency anemia due to chronic blood loss   2. Anemia in stage 3a chronic kidney disease ([place])     Labs reviewed and discussed with patient. Hemoglobin has improved to 11.8, iron panel shows saturation of 11 and a ferritin of 23.  TIBC 456.  Iron panel has improved, still consistent with iron deficiency anemia.  Recommend patient to proceed with IV Venofer weekly x3.  Chronic kidney disease.  Avoid nephrotoxins.  Etiology of iron deficiency, suspect GI bleeding.  Patient has had extensive GI work-up.  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  Colonoscopy showed several polyps and the pathology showed tubular adenoma, negative for cancer. 02/14/2021, capsule study showed normal study of small bowel. Continue close monitor.  Continue follow-up with GI.  Follow Up Instructions:  3 months, lab virtual MD +/- iron treatments.  I discussed the assessment and treatment plan with the patient. The patient was provided an opportunity to ask questions and all were answered. The patient agreed with the plan and demonstrated an understanding of the instructions.  The patient was advised to call back or seek an in-person evaluation if the symptoms worsen or if the condition fails to improve as anticipated.   Earlie Server, MD 05/16/2021 9:03 PM

## 2021-05-19 ENCOUNTER — Other Ambulatory Visit: Payer: Self-pay

## 2021-05-19 ENCOUNTER — Inpatient Hospital Stay: Payer: BC Managed Care – PPO

## 2021-05-19 VITALS — BP 124/76 | HR 72 | Temp 96.0°F | Resp 18

## 2021-05-19 DIAGNOSIS — D509 Iron deficiency anemia, unspecified: Secondary | ICD-10-CM | POA: Diagnosis not present

## 2021-05-19 DIAGNOSIS — D500 Iron deficiency anemia secondary to blood loss (chronic): Secondary | ICD-10-CM

## 2021-05-19 MED ORDER — SODIUM CHLORIDE 0.9 % IV SOLN
Freq: Once | INTRAVENOUS | Status: AC
  Filled 2021-05-19: qty 250

## 2021-05-19 MED ORDER — IRON SUCROSE 20 MG/ML IV SOLN
200.0000 mg | Freq: Once | INTRAVENOUS | Status: AC
  Administered 2021-05-19: 200 mg via INTRAVENOUS
  Filled 2021-05-19: qty 10

## 2021-05-19 MED ORDER — SODIUM CHLORIDE 0.9 % IV SOLN: 200.0000 mg | Freq: Once | INTRAVENOUS | Status: DC

## 2021-05-25 ENCOUNTER — Other Ambulatory Visit: Payer: Self-pay | Admitting: Gastroenterology

## 2021-05-26 ENCOUNTER — Other Ambulatory Visit: Payer: Self-pay

## 2021-05-26 ENCOUNTER — Inpatient Hospital Stay: Payer: BC Managed Care – PPO

## 2021-05-26 VITALS — BP 153/82 | HR 74 | Temp 97.0°F | Resp 18

## 2021-05-26 DIAGNOSIS — D500 Iron deficiency anemia secondary to blood loss (chronic): Secondary | ICD-10-CM

## 2021-05-26 DIAGNOSIS — D509 Iron deficiency anemia, unspecified: Secondary | ICD-10-CM | POA: Diagnosis not present

## 2021-05-26 MED ORDER — SODIUM CHLORIDE 0.9 % IV SOLN: 200.0000 mg | Freq: Once | INTRAVENOUS | Status: DC

## 2021-05-26 MED ORDER — SODIUM CHLORIDE 0.9 % IV SOLN
Freq: Once | INTRAVENOUS | Status: AC
  Filled 2021-05-26: qty 250

## 2021-05-26 MED ORDER — IRON SUCROSE 20 MG/ML IV SOLN
200.0000 mg | Freq: Once | INTRAVENOUS | Status: AC
  Administered 2021-05-26: 200 mg via INTRAVENOUS
  Filled 2021-05-26: qty 10

## 2021-06-02 ENCOUNTER — Inpatient Hospital Stay: Payer: BC Managed Care – PPO

## 2021-06-09 ENCOUNTER — Telehealth: Payer: Self-pay | Admitting: Oncology

## 2021-06-09 NOTE — Telephone Encounter (Signed)
Wife called and states that pt forgot his appt on last week. Please call back at [phone]

## 2021-06-17 ENCOUNTER — Inpatient Hospital Stay: Payer: BC Managed Care – PPO | Attending: Oncology

## 2021-06-17 ENCOUNTER — Other Ambulatory Visit: Payer: Self-pay

## 2021-06-17 VITALS — BP 147/82 | HR 79 | Temp 98.5°F | Resp 20

## 2021-06-17 DIAGNOSIS — N1831 Chronic kidney disease, stage 3a: Secondary | ICD-10-CM | POA: Diagnosis not present

## 2021-06-17 DIAGNOSIS — D500 Iron deficiency anemia secondary to blood loss (chronic): Secondary | ICD-10-CM

## 2021-06-17 DIAGNOSIS — Z79899 Other long term (current) drug therapy: Secondary | ICD-10-CM | POA: Insufficient documentation

## 2021-06-17 DIAGNOSIS — D509 Iron deficiency anemia, unspecified: Secondary | ICD-10-CM | POA: Diagnosis present

## 2021-06-17 MED ORDER — SODIUM CHLORIDE 0.9 % IV SOLN: 200.0000 mg | Freq: Once | INTRAVENOUS | Status: DC

## 2021-06-17 MED ORDER — SODIUM CHLORIDE 0.9 % IV SOLN
INTRAVENOUS | Status: DC
  Filled 2021-06-17: qty 250

## 2021-06-17 MED ORDER — IRON SUCROSE 20 MG/ML IV SOLN
200.0000 mg | Freq: Once | INTRAVENOUS | Status: AC
  Administered 2021-06-17: 200 mg via INTRAVENOUS
  Filled 2021-06-17: qty 10

## 2021-06-28 ENCOUNTER — Other Ambulatory Visit: Payer: Self-pay | Admitting: Gastroenterology

## 2021-08-10 ENCOUNTER — Other Ambulatory Visit: Payer: Self-pay | Admitting: Gastroenterology

## 2021-08-11 ENCOUNTER — Other Ambulatory Visit: Payer: Self-pay | Admitting: *Deleted

## 2021-08-11 DIAGNOSIS — D509 Iron deficiency anemia, unspecified: Secondary | ICD-10-CM

## 2021-08-13 MED ORDER — PANTOPRAZOLE SODIUM 40 MG PO TBEC: 40.0000 mg | DELAYED_RELEASE_TABLET | Freq: Two times a day (BID) | ORAL | 0 refills | Status: DC

## 2021-08-18 ENCOUNTER — Inpatient Hospital Stay: Payer: BC Managed Care – PPO | Attending: Oncology

## 2021-08-18 ENCOUNTER — Other Ambulatory Visit: Payer: Self-pay

## 2021-08-18 DIAGNOSIS — Z7984 Long term (current) use of oral hypoglycemic drugs: Secondary | ICD-10-CM | POA: Diagnosis not present

## 2021-08-18 DIAGNOSIS — D631 Anemia in chronic kidney disease: Secondary | ICD-10-CM | POA: Insufficient documentation

## 2021-08-18 DIAGNOSIS — Z7985 Long-term (current) use of injectable non-insulin antidiabetic drugs: Secondary | ICD-10-CM | POA: Insufficient documentation

## 2021-08-18 DIAGNOSIS — Z7982 Long term (current) use of aspirin: Secondary | ICD-10-CM | POA: Insufficient documentation

## 2021-08-18 DIAGNOSIS — Z79899 Other long term (current) drug therapy: Secondary | ICD-10-CM | POA: Diagnosis not present

## 2021-08-18 DIAGNOSIS — D500 Iron deficiency anemia secondary to blood loss (chronic): Secondary | ICD-10-CM | POA: Diagnosis present

## 2021-08-18 DIAGNOSIS — N1831 Chronic kidney disease, stage 3a: Secondary | ICD-10-CM | POA: Diagnosis not present

## 2021-08-18 DIAGNOSIS — D509 Iron deficiency anemia, unspecified: Secondary | ICD-10-CM

## 2021-08-18 LAB — CBC WITH DIFFERENTIAL/PLATELET
Abs Immature Granulocytes: 0.03 10*3/uL (ref 0.00–0.07)
Basophils Absolute: 0.1 10*3/uL (ref 0.0–0.1)
Basophils Relative: 1 %
Eosinophils Absolute: 0.7 10*3/uL — ABNORMAL HIGH (ref 0.0–0.5)
Eosinophils Relative: 10 %
HCT: 34.7 % — ABNORMAL LOW (ref 39.0–52.0)
Hemoglobin: 11.9 g/dL — ABNORMAL LOW (ref 13.0–17.0)
Immature Granulocytes: 0 %
Lymphocytes Relative: 29 %
Lymphs Abs: 2.1 10*3/uL (ref 0.7–4.0)
MCH: 30.7 pg (ref 26.0–34.0)
MCHC: 34.3 g/dL (ref 30.0–36.0)
MCV: 89.4 fL (ref 80.0–100.0)
Monocytes Absolute: 0.5 10*3/uL (ref 0.1–1.0)
Monocytes Relative: 6 %
Neutro Abs: 3.9 10*3/uL (ref 1.7–7.7)
Neutrophils Relative %: 54 %
Platelets: 230 10*3/uL (ref 150–400)
RBC: 3.88 MIL/uL — ABNORMAL LOW (ref 4.22–5.81)
RDW: 12.5 % (ref 11.5–15.5)
WBC: 7.3 10*3/uL (ref 4.0–10.5)
nRBC: 0 % (ref 0.0–0.2)

## 2021-08-18 LAB — IRON AND TIBC
Iron: 86 ug/dL (ref 45–182)
Saturation Ratios: 25 % (ref 17.9–39.5)
TIBC: 349 ug/dL (ref 250–450)
UIBC: 263 ug/dL

## 2021-08-18 LAB — FERRITIN: Ferritin: 68 ng/mL (ref 24–336)

## 2021-08-19 ENCOUNTER — Encounter: Payer: Self-pay | Admitting: Oncology

## 2021-08-19 ENCOUNTER — Inpatient Hospital Stay (HOSPITAL_BASED_OUTPATIENT_CLINIC_OR_DEPARTMENT_OTHER): Payer: BC Managed Care – PPO | Admitting: Oncology

## 2021-08-19 DIAGNOSIS — D631 Anemia in chronic kidney disease: Secondary | ICD-10-CM | POA: Diagnosis not present

## 2021-08-19 DIAGNOSIS — D500 Iron deficiency anemia secondary to blood loss (chronic): Secondary | ICD-10-CM

## 2021-08-19 DIAGNOSIS — N1831 Chronic kidney disease, stage 3a: Secondary | ICD-10-CM

## 2021-08-19 NOTE — Progress Notes (Signed)
HEMATOLOGY-ONCOLOGY TeleHEALTH VISIT PROGRESS NOTE  I connected with Jesse Ray on 08/19/21  at  2:45 PM EST by video enabled telemedicine visit and verified that I am speaking with the correct person using two identifiers. I discussed the limitations, risks, security and privacy concerns of performing an evaluation and management service by telemedicine and the availability of in-person appointments. The patient expressed understanding and agreed to proceed.   Other persons participating in the visit and their role in the encounter:  None  Patient's location: patient in his vehicle. I confirmed with him that he is ok to talk.  Provider's location: office Chief Complaint: Follow-up for iron deficiency anemia.   INTERVAL HISTORY Jesse Ray is a 61 y.o. male who has above history reviewed by me today presents for follow up visit for iron deficiency anemia I attempted to connect the patient for visual enabled telehealth visit.  Due to the technical difficulties with video,  Patient was transitioned to audio only visit. He reports feeling well. No new complaints.    Review of Systems  Constitutional:  Negative for appetite change, chills, fatigue, fever and unexpected weight change.  HENT:   Negative for hearing loss and voice change.   Eyes:  Negative for eye problems and icterus.  Respiratory:  Negative for chest tightness, cough and shortness of breath.   Cardiovascular:  Negative for chest pain and leg swelling.  Gastrointestinal:  Negative for abdominal distention and abdominal pain.  Endocrine: Negative for hot flashes.  Genitourinary:  Negative for difficulty urinating, dysuria and frequency.   Musculoskeletal:  Negative for arthralgias.  Skin:  Negative for itching and rash.  Neurological:  Negative for light-headedness and numbness.  Hematological:  Negative for adenopathy. Does not bruise/bleed easily.  Psychiatric/Behavioral:  Negative for confusion.    Past Medical  History:  Diagnosis Date   Anginal pain ([place])    Coronary artery disease    Diabetes mellitus without complication ([place])    HLD (hyperlipidemia) 04/11/2020   Hyperlipidemia    Hypertension    IDA (iron deficiency anemia) 02/11/2021   Psoriatic arthritis ([place])    S/P angioplasty with stent 04/10/30 of proximal and mLAD DES and residual LAD stenosis 04/11/2020   Past Surgical History:  Procedure Laterality Date   COLONOSCOPY WITH PROPOFOL N/A 01/23/2021   Procedure: COLONOSCOPY WITH PROPOFOL;  Surgeon: Jonathon Bellows, MD;  Location: [facility] ENDOSCOPY;  Service: Gastroenterology;  Laterality: N/A;   CORONARY STENT INTERVENTION N/A 04/10/2020   Procedure: CORONARY STENT INTERVENTION;  Surgeon: Wellington Hampshire, MD;  Location: [place] CV LAB;  Service: Cardiovascular;  Laterality: N/A;  LAD    ESOPHAGOGASTRODUODENOSCOPY N/A 01/23/2021   Procedure: ESOPHAGOGASTRODUODENOSCOPY (EGD);  Surgeon: Jonathon Bellows, MD;  Location: [facility] ENDOSCOPY;  Service: Gastroenterology;  Laterality: N/A;   GIVENS CAPSULE STUDY N/A 02/10/2021   Procedure: GIVENS CAPSULE STUDY;  Surgeon: Jonathon Bellows, MD;  Location: [facility] ENDOSCOPY;  Service: Gastroenterology;  Laterality: N/A;   INTRAVASCULAR ULTRASOUND/IVUS N/A 04/10/2020   Procedure: Intravascular Ultrasound/IVUS;  Surgeon: Wellington Hampshire, MD;  Location: [place] CV LAB;  Service: Cardiovascular;  Laterality: N/A;  LAD   LEFT HEART CATH AND CORONARY ANGIOGRAPHY N/A 04/01/2020   Procedure: LEFT HEART CATH AND CORONARY ANGIOGRAPHY;  Surgeon: Wellington Hampshire, MD;  Location: [place] CV LAB;  Service: Cardiovascular;  Laterality: N/A;    Family History  Problem Relation Age of Onset   Dementia Mother    Heart disease Father    Throat cancer Maternal Grandfather  Social History   Socioeconomic History   Marital status: Married    Spouse name: Not on file   Number of children: Not on file   Years of education: Not on file   Highest education level: Not on  file  Occupational History   Not on file  Tobacco Use   Smoking status: Former    Years: 30.00    Types: Cigarettes    Quit date: 2009    Years since quitting: 14.1   Smokeless tobacco: Never  Vaping Use   Vaping Use: Never used  Substance and Sexual Activity   Alcohol use: Yes    Comment: occasional alcohol intake   Drug use: Never   Sexual activity: Yes  Other Topics Concern   Not on file  Social History Narrative   Not on file   Social Determinants of Health   Financial Resource Strain: Not on file  Food Insecurity: Not on file  Transportation Needs: Not on file  Physical Activity: Not on file  Stress: Not on file  Social Connections: Not on file  Intimate Partner Violence: Not on file    Current Outpatient Medications on File Prior to Visit  Medication Sig Dispense Refill   acetaminophen (TYLENOL) 325 MG tablet Take 2 tablets (650 mg total) by mouth every 4 (four) hours as needed for headache or mild pain.     Adalimumab 40 MG/0.8ML PNKT Inject 0.4 mLs into the skin every 14 (fourteen) days.      albuterol (VENTOLIN HFA) 108 (90 Base) MCG/ACT inhaler SMARTSIG:2 Puff(s) By Mouth Every 6 Hours PRN     ascorbic acid (VITAMIN C) 1000 MG tablet Take 1,000 mg by mouth daily.      aspirin 81 MG tablet Take 81 mg by mouth daily.     cetirizine (ZYRTEC) 5 MG tablet Take 10 mg by mouth daily.     Cholecalciferol 25 MCG (1000 UT) tablet Take 1,000 Units by mouth daily.      clobetasol cream (TEMOVATE) 6.19 % Apply 1 application topically 2 (two) times daily as needed (Psoriasis).      glimepiride (AMARYL) 4 MG tablet Take 4 mg by mouth in the morning and at bedtime.     metFORMIN (GLUCOPHAGE) 1000 MG tablet Take 1 tablet (1,000 mg total) by mouth 2 (two) times daily with a meal.     metoprolol succinate (TOPROL-XL) 25 MG 24 hr tablet Take 1 tablet (25 mg total) by mouth daily. 90 tablet 3   pantoprazole (PROTONIX) 40 MG tablet Take 1 tablet (40 mg total) by mouth 2 (two) times  daily. 180 tablet 0   rosuvastatin (CRESTOR) 40 MG tablet TAKE 1 TABLET BY MOUTH AT BEDTIME 90 tablet 2   vitamin E 180 MG (400 UNITS) capsule Take 400 Units by mouth daily.      Zinc 30 MG TABS Take 25 mg by mouth daily.     exenatide (BYETTA 10 MCG PEN) 10 MCG/0.04ML SOPN injection Inject 10 mcg into the skin 2 (two) times daily with a meal. 2.4 ml=1 pen (Patient not taking: Reported on 08/19/2021)     nitroGLYCERIN (NITROSTAT) 0.4 MG SL tablet Place 1 tablet (0.4 mg total) under the tongue every 5 (five) minutes as needed for chest pain. (Patient not taking: Reported on 05/16/2021) 25 tablet 4   Current Facility-Administered Medications on File Prior to Visit  Medication Dose Route Frequency Provider Last Rate Last Admin   sodium chloride flush (NS) 0.9 % injection 3 mL  3 mL Intravenous Q12H Kate Sable, MD        Allergies  Allergen Reactions   Atorvastatin     Other reaction(s): Muscle Pain       Observations/Objective: There were no vitals filed for this visit.  There is no height or weight on file to calculate BMI.  Physical Exam  CBC    Component Value Date/Time   WBC 7.3 08/18/2021 0758   RBC 3.88 (L) 08/18/2021 0758   HGB 11.9 (L) 08/18/2021 0758   HGB 9.8 (L) 01/23/2021 1343   HCT 34.7 (L) 08/18/2021 0758   HCT 31.8 (L) 01/23/2021 1343   PLT 230 08/18/2021 0758   PLT 281 01/23/2021 1343   MCV 89.4 08/18/2021 0758   MCV 82 01/23/2021 1343   MCH 30.7 08/18/2021 0758   MCHC 34.3 08/18/2021 0758   RDW 12.5 08/18/2021 0758   RDW 16.2 (H) 01/23/2021 1343   LYMPHSABS 2.1 08/18/2021 0758   MONOABS 0.5 08/18/2021 0758   EOSABS 0.7 (H) 08/18/2021 0758   BASOSABS 0.1 08/18/2021 0758    CMP     Component Value Date/Time   NA 140 02/11/2021 1056   NA 146 (H) 01/17/2021 0858   K 5.4 (H) 02/11/2021 1056   CL 104 02/11/2021 1056   CO2 27 02/11/2021 1056   GLUCOSE 124 (H) 02/11/2021 1056   BUN 26 (H) 02/11/2021 1056   BUN 24 01/17/2021 0858   CREATININE 1.53  (H) 02/11/2021 1056   CALCIUM 9.5 02/11/2021 1056   PROT 7.7 02/11/2021 1056   ALBUMIN 4.2 02/11/2021 1056   AST 46 (H) 02/11/2021 1056   ALT 40 02/11/2021 1056   ALKPHOS 87 02/11/2021 1056   BILITOT 0.2 (L) 02/11/2021 1056   GFRNONAA 52 (L) 02/11/2021 1056   GFRAA >60 04/11/2020 0213     Assessment and Plan: 1. Iron deficiency anemia due to chronic blood loss   2. Anemia in stage 3a chronic kidney disease (HCC)     Iron deficiency anemia/ Anemia in CKD Labs are reviewed and discussed with patient. Hb is stable at 11.9 Ferritin level has normalized, 68. Iron saturation 25 Hold off Venofer.   Etiology of iron deficiency, suspect GI bleeding.  Patient has had extensive GI work-up.  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   I recommend him to follow up in 6 months to ensure stability of his blood level and iron level. If normal, will discharge him.   Follow Up Instructions:  6 months, lab  MD +/- iron treatments.  I discussed the assessment and treatment plan with the patient. The patient was provided an opportunity to ask questions and all were answered. The patient agreed with the plan and demonstrated an understanding of the instructions.  The patient was advised to call back or seek an in-person evaluation if the symptoms worsen or if the condition fails to improve as anticipated.   Earlie Server, MD 08/19/2021 10:48 PM

## 2021-08-20 ENCOUNTER — Inpatient Hospital Stay: Payer: BC Managed Care – PPO

## 2021-09-29 ENCOUNTER — Telehealth: Payer: Self-pay | Admitting: *Deleted

## 2021-09-29 NOTE — Telephone Encounter (Signed)
Patient needs to change appointment with Dr. Tasia Catchings. ?

## 2021-09-30 ENCOUNTER — Encounter: Payer: Self-pay | Admitting: Oncology

## 2021-11-12 ENCOUNTER — Encounter: Payer: Self-pay | Admitting: Cardiology

## 2021-11-13 ENCOUNTER — Encounter: Payer: Self-pay | Admitting: Gastroenterology

## 2021-11-14 ENCOUNTER — Encounter: Payer: Self-pay | Admitting: Gastroenterology

## 2021-11-14 ENCOUNTER — Encounter
Admission: RE | Disposition: A | Discharge status: Home / Self Care | Payer: Self-pay | Source: Ambulatory Visit | Attending: Gastroenterology

## 2021-11-14 ENCOUNTER — Ambulatory Visit: Payer: BC Managed Care – PPO | Admitting: Certified Registered"

## 2021-11-14 ENCOUNTER — Ambulatory Visit
Admission: RE | Admit: 2021-11-14 | Discharge: 2021-11-14 | Disposition: A | Discharge status: Home / Self Care | Payer: BC Managed Care – PPO | Source: Ambulatory Visit | Attending: Gastroenterology | Admitting: Gastroenterology

## 2021-11-14 DIAGNOSIS — K2980 Duodenitis without bleeding: Secondary | ICD-10-CM | POA: Insufficient documentation

## 2021-11-14 DIAGNOSIS — K921 Melena: Secondary | ICD-10-CM | POA: Diagnosis not present

## 2021-11-14 DIAGNOSIS — I2510 Atherosclerotic heart disease of native coronary artery without angina pectoris: Secondary | ICD-10-CM | POA: Insufficient documentation

## 2021-11-14 DIAGNOSIS — E785 Hyperlipidemia, unspecified: Secondary | ICD-10-CM | POA: Diagnosis not present

## 2021-11-14 DIAGNOSIS — K2950 Unspecified chronic gastritis without bleeding: Secondary | ICD-10-CM | POA: Insufficient documentation

## 2021-11-14 DIAGNOSIS — Z87891 Personal history of nicotine dependence: Secondary | ICD-10-CM | POA: Insufficient documentation

## 2021-11-14 DIAGNOSIS — D500 Iron deficiency anemia secondary to blood loss (chronic): Secondary | ICD-10-CM | POA: Insufficient documentation

## 2021-11-14 DIAGNOSIS — K219 Gastro-esophageal reflux disease without esophagitis: Secondary | ICD-10-CM | POA: Insufficient documentation

## 2021-11-14 DIAGNOSIS — E119 Type 2 diabetes mellitus without complications: Secondary | ICD-10-CM | POA: Diagnosis not present

## 2021-11-14 DIAGNOSIS — K3189 Other diseases of stomach and duodenum: Secondary | ICD-10-CM | POA: Diagnosis not present

## 2021-11-14 DIAGNOSIS — Z7982 Long term (current) use of aspirin: Secondary | ICD-10-CM | POA: Diagnosis not present

## 2021-11-14 HISTORY — DX: Gastro-esophageal reflux disease without esophagitis: K21.9

## 2021-11-14 HISTORY — PX: ESOPHAGOGASTRODUODENOSCOPY: SHX5428

## 2021-11-14 LAB — GLUCOSE, CAPILLARY: Glucose-Capillary: 137 mg/dL — ABNORMAL HIGH (ref 70–99)

## 2021-11-14 SURGERY — EGD (ESOPHAGOGASTRODUODENOSCOPY)
Anesthesia: General

## 2021-11-14 MED ORDER — SODIUM CHLORIDE 0.9 % IV SOLN
INTRAVENOUS | Status: DC
  Administered 2021-11-14: 20 mL/h via INTRAVENOUS

## 2021-11-14 MED ORDER — PROPOFOL 10 MG/ML IV BOLUS
INTRAVENOUS | Status: DC | PRN
  Administered 2021-11-14: 40 mg via INTRAVENOUS
  Administered 2021-11-14: 120 mg via INTRAVENOUS

## 2021-11-14 MED ORDER — LIDOCAINE HCL (CARDIAC) PF 100 MG/5ML IV SOSY
PREFILLED_SYRINGE | INTRAVENOUS | Status: DC | PRN
  Administered 2021-11-14: 100 mg via INTRAVENOUS

## 2021-11-14 NOTE — Transfer of Care (Signed)
Immediate Anesthesia Transfer of Care Note ? ?Patient: Jesse Ray ? ?Procedure(s) Performed: ESOPHAGOGASTRODUODENOSCOPY (EGD) ? ?Patient Location: Endoscopy Unit ? ?Anesthesia Type:General ? ?Level of Consciousness: drowsy ? ?Airway & Oxygen Therapy: Patient Spontanous Breathing and Patient connected to nasal cannula oxygen ? ?Post-op Assessment: Report given to RN, Post -op Vital signs reviewed and stable and Patient moving all extremities ? ?Post vital signs: Reviewed and stable ? ?Last Vitals:  ?Vitals Value Taken Time  ?BP 108/73 11/14/21 0846  ?Temp 36.4 ?C 11/14/21 0844  ?Pulse 80 11/14/21 0846  ?Resp 16 11/14/21 0846  ?SpO2 92 % 11/14/21 0846  ? ? ?Last Pain:  ?Vitals:  ? 11/14/21 0844  ?TempSrc: Temporal  ?PainSc: Asleep  ?   ? ?  ? ?Complications: No notable events documented. ?

## 2021-11-14 NOTE — Anesthesia Preprocedure Evaluation (Signed)
Anesthesia Evaluation  ?Patient identified by MRN, date of birth, ID band ?Patient awake ? ? ? ?Reviewed: ?Allergy & Precautions, NPO status , Patient's Chart, lab work & pertinent test results ? ?History of Anesthesia Complications ?Negative for: history of anesthetic complications ? ?Airway ?Mallampati: III ? ?TM Distance: <3 FB ?Neck ROM: full ? ? ? Dental ? ?(+) Chipped, Poor Dentition, Missing ?  ?Pulmonary ?neg shortness of breath, asthma , former smoker,  ?  ?Pulmonary exam normal ? ? ? ? ? ? ? Cardiovascular ?hypertension, (-) angina+ CAD  ?Normal cardiovascular exam ? ? ?  ?Neuro/Psych ?negative neurological ROS ? negative psych ROS  ? GI/Hepatic ?Neg liver ROS, GERD  Controlled,  ?Endo/Other  ?diabetes, Type 2 ? Renal/GU ?Renal disease  ?negative genitourinary ?  ?Musculoskeletal ? ? Abdominal ?  ?Peds ? Hematology ?negative hematology ROS ?(+)   ?Anesthesia Other Findings ?Past Medical History: ?No date: Anginal pain ([place]) ?No date: Coronary artery disease ?No date: Diabetes mellitus without complication ([place]) ?No date: GERD (gastroesophageal reflux disease) ?04/11/2020: HLD (hyperlipidemia) ?No date: Hyperlipidemia ?No date: Hypertension ?02/11/2021: IDA (iron deficiency anemia) ?No date: Psoriatic arthritis ([place]) ?04/11/2020: S/P angioplasty with stent 04/10/30 of proximal and mLAD  ?DES and residual LAD stenosis ? ?Past Surgical History: ?01/23/2021: COLONOSCOPY WITH PROPOFOL; N/A ?    Comment:  Procedure: COLONOSCOPY WITH PROPOFOL;  Surgeon: Vicente Males,  ?             Bailey Mech, MD;  Location: [place];  Service:  ?             Gastroenterology;  Laterality: N/A; ?04/10/2020: CORONARY STENT INTERVENTION; N/A ?    Comment:  Procedure: CORONARY STENT INTERVENTION;  Surgeon: Fletcher Anon, ?             Mertie Clause, MD;  Location: [place] CV LAB;  Service:  ?             Cardiovascular;  Laterality: N/A;  LAD ? ?01/23/2021: ESOPHAGOGASTRODUODENOSCOPY; N/A ?    Comment:  Procedure:  ESOPHAGOGASTRODUODENOSCOPY (EGD);  Surgeon:  ?             Jonathon Bellows, MD;  Location: [facility] ENDOSCOPY;  Service:  ?             Gastroenterology;  Laterality: N/A; ?02/10/2021: GIVENS CAPSULE STUDY; N/A ?    Comment:  Procedure: GIVENS CAPSULE STUDY;  Surgeon: Jonathon Bellows,  ?             MD;  Location: [facility] ENDOSCOPY;  Service:  ?             Gastroenterology;  Laterality: N/A; ?04/10/2020: INTRAVASCULAR ULTRASOUND/IVUS; N/A ?    Comment:  Procedure: Intravascular Ultrasound/IVUS;  Surgeon:  ?             Wellington Hampshire, MD;  Location: [place] CV LAB;   ?             Service: Cardiovascular;  Laterality: N/A;  LAD ?04/01/2020: LEFT HEART CATH AND CORONARY ANGIOGRAPHY; N/A ?    Comment:  Procedure: LEFT HEART CATH AND CORONARY ANGIOGRAPHY;   ?             Surgeon: Wellington Hampshire, MD;  Location: [place]  ?             CV LAB;  Service: Cardiovascular;  Laterality: N/A; ? ?BMI   ? Body Mass Index: 24.43 kg/m?  ?  ? ? Reproductive/Obstetrics ?negative OB ROS ? ?  ? ? ? ? ? ? ? ? ? ? ? ? ? ?  ?  ? ? ? ? ? ? ? ? ?  Anesthesia Physical ?Anesthesia Plan ? ?ASA: 3 ? ?Anesthesia Plan: General  ? ?Post-op Pain Management:   ? ?Induction: Intravenous ? ?PONV Risk Score and Plan: Propofol infusion and TIVA ? ?Airway Management Planned: Natural Airway and Nasal Cannula ? ?Additional Equipment:  ? ?Intra-op Plan:  ? ?Post-operative Plan:  ? ?Informed Consent: I have reviewed the patients History and Physical, chart, labs and discussed the procedure including the risks, benefits and alternatives for the proposed anesthesia with the patient or authorized representative who has indicated his/her understanding and acceptance.  ? ? ? ?Dental Advisory Given ? ?Plan Discussed with: Anesthesiologist, CRNA and Surgeon ? ?Anesthesia Plan Comments: (Patient consented for risks of anesthesia including but not limited to:  ?- adverse reactions to medications ?- risk of airway placement if required ?- damage to eyes, teeth, lips or  other oral mucosa ?- nerve damage due to positioning  ?- sore throat or hoarseness ?- Damage to heart, brain, nerves, lungs, other parts of body or loss of life ? ?Patient voiced understanding.)  ? ? ? ? ? ? ?Anesthesia Quick Evaluation ? ?

## 2021-11-14 NOTE — Anesthesia Postprocedure Evaluation (Signed)
Anesthesia Post Note ? ?Patient: Jesse Ray ? ?Procedure(s) Performed: ESOPHAGOGASTRODUODENOSCOPY (EGD) ? ?Patient location during evaluation: Endoscopy ?Anesthesia Type: General ?Level of consciousness: awake and alert ?Pain management: pain level controlled ?Vital Signs Assessment: post-procedure vital signs reviewed and stable ?Respiratory status: spontaneous breathing, nonlabored ventilation, respiratory function stable and patient connected to nasal cannula oxygen ?Cardiovascular status: blood pressure returned to baseline and stable ?Postop Assessment: no apparent nausea or vomiting ?Anesthetic complications: no ? ? ?No notable events documented. ? ? ?Last Vitals:  ?Vitals:  ? 11/14/21 0846 11/14/21 0854  ?BP: 108/73 105/73  ?Pulse: 80   ?Resp: 16   ?Temp:    ?SpO2: 92%   ?  ?Last Pain:  ?Vitals:  ? 11/14/21 0904  ?TempSrc:   ?PainSc: 0-No pain  ? ? ?  ?  ?  ?  ?  ?  ? ?Precious Haws Amonie Wisser ? ? ? ? ?

## 2021-11-14 NOTE — Op Note (Signed)
[facility] ?Gastroenterology ?Patient Name: Jesse Ray ?Procedure Date: 11/14/2021 8:15 AM ?MRN: [id number] ?Account #: [id number] ?Date of Birth: 1960-09-16 ?Admit Type: Outpatient ?Age: 61 ?Room: [facility] ENDO ROOM 1 ?Gender: Male ?Note Status: Finalized ?Instrument Name: Upper Endoscope [id number] ?Procedure:             Upper GI endoscopy ?Indications:           Melena, Iron deficiency anemia ?Providers:             Annamaria Helling DO, DO ?Referring MD:          Ramonita Lab, MD (Referring MD) ?Medicines:             Monitored Anesthesia Care ?Complications:         No immediate complications. Estimated blood loss:  ?                       Minimal. ?Procedure:             Pre-Anesthesia Assessment: ?                       - Prior to the procedure, a History and Physical was  ?                       performed, and patient medications and allergies were  ?                       reviewed. The patient is competent. The risks and  ?                       benefits of the procedure and the sedation options and  ?                       risks were discussed with the patient. All questions  ?                       were answered and informed consent was obtained.  ?                       Patient identification and proposed procedure were  ?                       verified by the physician, the nurse, the anesthetist  ?                       and the technician in the endoscopy suite. Mental  ?                       Status Examination: alert and oriented. Airway  ?                       Examination: normal oropharyngeal airway and neck  ?                       mobility. Respiratory Examination: clear to  ?                       auscultation. CV Examination: RRR, no murmurs, no S3  ?  or S4. Prophylactic Antibiotics: The patient does not  ?                       require prophylactic antibiotics. Prior  ?                       Anticoagulants: The patient has taken no previous  ?                        anticoagulant or antiplatelet agents. ASA Grade  ?                       Assessment: III - A patient with severe systemic  ?                       disease. After reviewing the risks and benefits, the  ?                       patient was deemed in satisfactory condition to  ?                       undergo the procedure. The anesthesia plan was to use  ?                       monitored anesthesia care (MAC). Immediately prior to  ?                       administration of medications, the patient was  ?                       re-assessed for adequacy to receive sedatives. The  ?                       heart rate, respiratory rate, oxygen saturations,  ?                       blood pressure, adequacy of pulmonary ventilation, and  ?                       response to care were monitored throughout the  ?                       procedure. The physical status of the patient was  ?                       re-assessed after the procedure. ?                       After obtaining informed consent, the endoscope was  ?                       passed under direct vision. Throughout the procedure,  ?                       the patient's blood pressure, pulse, and oxygen  ?                       saturations were monitored continuously. The  ?  Endosonoscope was introduced through the mouth, and  ?                       advanced to the second part of duodenum. The upper GI  ?                       endoscopy was accomplished without difficulty. The  ?                       patient tolerated the procedure well. ?Findings: ?     Localized mildly erythematous mucosa without active bleeding and with no  ?     stigmata of bleeding was found in the duodenal bulb. Biopsies for  ?     histology were taken with a cold forceps for evaluation of celiac  ?     disease. Estimated blood loss was minimal. ?     Localized mild inflammation characterized by erythema and granularity  ?     was found in the gastric antrum.  Biopsies were taken with a cold forceps  ?     for Helicobacter pylori testing. Estimated blood loss was minimal. ?     The exam of the duodenum was otherwise normal. No AVMs were noted. ?     The exam of the stomach was otherwise normal. ?     Esophagogastric landmarks were identified: the gastroesophageal junction  ?     was found at 38 cm from the incisors. ?     The Z-line was regular. Estimated blood loss: none. ?     The exam of the esophagus was otherwise normal. ?Impression:            - Erythematous duodenopathy. Biopsied. ?                       - Gastritis. Biopsied. ?                       - Esophagogastric landmarks identified. ?                       - Z-line regular. ?Recommendation:        - Discharge patient to home. ?                       - Resume previous diet. ?                       - Continue present medications. ?                       - Recommend IV Iron infusions if anemia and iron  ?                       deficiency recur. ?                       Continue proton pump inhibitor therapy ?                       Consider repeat colonoscopy and/or video capsule  ?                       endoscopy. ?  Avoid non-steroidal anti-inflammatories ?                       - Await pathology results. ?                       - Return to GI clinic as previously scheduled. ?                       - The findings and recommendations were discussed with  ?                       the patient. ?Procedure Code(s):     --- Professional --- ?                       [id number], Esophagogastroduodenoscopy, flexible,  ?                       transoral; with biopsy, single or multiple ?Diagnosis Code(s):     --- Professional --- ?                       K31.89, Other diseases of stomach and duodenum ?                       K29.70, Gastritis, unspecified, without bleeding ?                       K92.1, Melena (includes Hematochezia) ?                       D50.9, Iron deficiency anemia, unspecified ?CPT  copyright 2019 American Medical Association. All rights reserved. ?The codes documented in this report are preliminary and upon coder review may  ?be revised to meet current compliance requirements. ?Attending Participation: ?     I personally performed the entire procedure. ?Volney American, DO ?Annamaria Helling DO, DO ?11/14/2021 8:47:22 AM ?This report has been signed electronically. ?Number of Addenda: 0 ?Note Initiated On: 11/14/2021 8:15 AM ?Estimated Blood Loss:  Estimated blood loss was minimal. ?     [facility] ?

## 2021-11-14 NOTE — Interval H&P Note (Signed)
History and Physical Interval Note: Preprocedure H&P from 11/14/21 ? was reviewed and there was no interval change after seeing and examining the patient.  Written consent was obtained from the patient after discussion of risks, benefits, and alternatives. Patient has consented to proceed with Esophagogastroduodenoscopy with possible intervention ? ? ?11/14/2021 ?8:21 AM ? 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  The various methods of treatment have been discussed with the patient and family. After consideration of risks, benefits and other options for treatment, the patient has consented to  Procedure(s): ?ESOPHAGOGASTRODUODENOSCOPY (EGD) (N/A) as a surgical intervention.  The patient's history has been reviewed, patient examined, no change in status, stable for surgery.  I have reviewed the patient's chart and labs.  Questions were answered to the patient's satisfaction.   ? ? ?Jesse Ray ? ? ?

## 2021-11-14 NOTE — H&P (Signed)
? ?Pre-Procedure H&P ?  ?Patient ID: Jesse Ray is a 61 y.o. male. ? ?Gastroenterology Provider: Annamaria Helling, DO ? ?Referring Provider: Dawson Bills, NP ?PCP: Adin Hector, MD ? ?Date: 11/14/2021 ? ?HPI ?[name] is a 61 y.o. male who presents today for Esophagogastroduodenoscopy for Dark stools, anemia, history of GI bleeding. ? 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  Last underwent EGD in July and August 2022 and colonoscopy (July) with polyps noted.  AVMs were treated with APC. Also underwent capsule endoscopy. ? ?Patient has noted dark stool even on aspirin.  After stopping his iron this did improve some, but still experiencing dark stools.  Hemoglobin is downtrended from 12.9-11 with an MCV of 94.7 A1c 9.3 creatinine 1.9 most recent labs.  Most recent iron studies demonstrate ferritin 52 iron sat 18%.  He has since stopped his aspirin as well. ? ?No dysphagia/odynophagia or reflux sx. ? ?Past Medical History:  ?Diagnosis Date  ? Anginal pain ([place])   ? Coronary artery disease   ? Diabetes mellitus without complication ([place])   ? GERD (gastroesophageal reflux disease)   ? HLD (hyperlipidemia) 04/11/2020  ? Hyperlipidemia   ? Hypertension   ? IDA (iron deficiency anemia) 02/11/2021  ? Psoriatic arthritis ([place])   ? S/P angioplasty with stent 04/10/30 of proximal and mLAD DES and residual LAD stenosis 04/11/2020  ? ? ?Past Surgical History:  ?Procedure Laterality Date  ? COLONOSCOPY WITH PROPOFOL N/A 01/23/2021  ? Procedure: COLONOSCOPY WITH PROPOFOL;  Surgeon: Jonathon Bellows, MD;  Location: [facility] ENDOSCOPY;  Service: Gastroenterology;  Laterality: N/A;  ? CORONARY STENT INTERVENTION N/A 04/10/2020  ? Procedure: CORONARY STENT INTERVENTION;  Surgeon: Wellington Hampshire, MD;  Location: [place] CV LAB;  Service: Cardiovascular;  Laterality: N/A;  LAD ?  ? ESOPHAGOGASTRODUODENOSCOPY N/A 01/23/2021  ? Procedure: ESOPHAGOGASTRODUODENOSCOPY (EGD);  Surgeon: Jonathon Bellows,  MD;  Location: [facility] ENDOSCOPY;  Service: Gastroenterology;  Laterality: N/A;  ? GIVENS CAPSULE STUDY N/A 02/10/2021  ? Procedure: GIVENS CAPSULE STUDY;  Surgeon: Jonathon Bellows, MD;  Location: [facility] ENDOSCOPY;  Service: Gastroenterology;  Laterality: N/A;  ? INTRAVASCULAR ULTRASOUND/IVUS N/A 04/10/2020  ? Procedure: Intravascular Ultrasound/IVUS;  Surgeon: Wellington Hampshire, MD;  Location: [place] CV LAB;  Service: Cardiovascular;  Laterality: N/A;  LAD  ? LEFT HEART CATH AND CORONARY ANGIOGRAPHY N/A 04/01/2020  ? Procedure: LEFT HEART CATH AND CORONARY ANGIOGRAPHY;  Surgeon: Wellington Hampshire, MD;  Location: [place] CV LAB;  Service: Cardiovascular;  Laterality: N/A;  ? ? ?Family History ?Father- colon polyps ?No other h/o GI disease or malignancy ? ?Review of Systems  ?Constitutional:  Positive for fatigue. Negative for activity change, appetite change, chills, diaphoresis, fever and unexpected weight change.  ?HENT:  Negative for trouble swallowing and voice change.   ?Respiratory:  Negative for shortness of breath and wheezing.   ?Cardiovascular:  Negative for chest pain, palpitations and leg swelling.  ?Gastrointestinal:  Positive for blood in stool (dark stools). Negative for abdominal distention, abdominal pain, anal bleeding, constipation, diarrhea, nausea and vomiting.  ?Musculoskeletal:  Negative for arthralgias and myalgias.  ?Skin:  Negative for color change and pallor.  ?Neurological:  Positive for weakness. Negative for dizziness and syncope.  ?Psychiatric/Behavioral:  Negative for confusion. The patient is not nervous/anxious.   ?All other systems reviewed and are negative.  ? ?Medications ?No current facility-administered medications on file prior to encounter.  ? ?Current Outpatient Medications on File Prior to Encounter  ?Medication  Sig Dispense Refill  ? acetaminophen (TYLENOL) 325 MG tablet Take 2 tablets (650 mg total) by mouth every 4 (four) hours as needed for headache or mild pain.    ?  Adalimumab 40 MG/0.8ML PNKT Inject 0.4 mLs into the skin every 14 (fourteen) days.     ? albuterol (VENTOLIN HFA) 108 (90 Base) MCG/ACT inhaler SMARTSIG:2 Puff(s) By Mouth Every 6 Hours PRN    ? ascorbic acid (VITAMIN C) 1000 MG tablet Take 1,000 mg by mouth daily.     ? aspirin 81 MG tablet Take 81 mg by mouth daily.    ? cetirizine (ZYRTEC) 5 MG tablet Take 10 mg by mouth daily.    ? Cholecalciferol 25 MCG (1000 UT) tablet Take 1,000 Units by mouth daily.     ? clobetasol cream (TEMOVATE) 8.18 % Apply 1 application topically 2 (two) times daily as needed (Psoriasis).     ? Dulaglutide (TRULICITY) 2.99 BZ/1.6RC SOPN Inject into the skin.    ? glimepiride (AMARYL) 4 MG tablet Take 4 mg by mouth in the morning and at bedtime.    ? metFORMIN (GLUCOPHAGE) 1000 MG tablet Take 1 tablet (1,000 mg total) by mouth 2 (two) times daily with a meal.    ? metoprolol succinate (TOPROL-XL) 25 MG 24 hr tablet Take 1 tablet (25 mg total) by mouth daily. 90 tablet 3  ? pantoprazole (PROTONIX) 40 MG tablet Take 1 tablet (40 mg total) by mouth 2 (two) times daily. 180 tablet 0  ? rosuvastatin (CRESTOR) 40 MG tablet TAKE 1 TABLET BY MOUTH AT BEDTIME 90 tablet 2  ? Semaglutide,0.25 or 0.'5MG'$ /DOS, (OZEMPIC, 0.25 OR 0.5 MG/DOSE,) 2 MG/3ML SOPN Inject into the skin.    ? vitamin E 180 MG (400 UNITS) capsule Take 400 Units by mouth daily.     ? Zinc 30 MG TABS Take 25 mg by mouth daily.    ? exenatide (BYETTA 10 MCG PEN) 10 MCG/0.04ML SOPN injection Inject 10 mcg into the skin 2 (two) times daily with a meal. 2.4 ml=1 pen (Patient not taking: Reported on 08/19/2021)    ? nitroGLYCERIN (NITROSTAT) 0.4 MG SL tablet Place 1 tablet (0.4 mg total) under the tongue every 5 (five) minutes as needed for chest pain. (Patient not taking: Reported on 05/16/2021) 25 tablet 4  ? ? ?Pertinent medications related to GI and procedure were reviewed by me with the patient prior to the procedure ? ? ?Current Facility-Administered Medications:  ?  0.9 %  sodium  chloride infusion, , Intravenous, Continuous, Annamaria Helling, DO ? sodium chloride    ?  ?  ? ?Allergies  ?Allergen Reactions  ? Atorvastatin   ?  Other reaction(s): Muscle Pain  ? ?Allergies were reviewed by me prior to the procedure ? ?Objective  ? ?Body mass index is 24.43 kg/m?. ?Vitals:  ? 11/14/21 0811  ?BP: 118/86  ?Pulse: 71  ?Resp: 20  ?Temp: (!) 96.2 ?F (35.7 ?C)  ?TempSrc: Temporal  ?SpO2: 97%  ?Weight: 70.8 kg  ?Height: '5\' 7"'$  (1.702 m)  ? ? ?  ?Physical Exam ?Vitals and nursing note reviewed.  ?Constitutional:   ?   General: He is not in acute distress. ?   Appearance: Normal appearance. He is not ill-appearing, toxic-appearing or diaphoretic.  ?HENT:  ?   Head: Normocephalic and atraumatic.  ?   Nose: Nose normal.  ?   Mouth/Throat:  ?   Mouth: Mucous membranes are moist.  ?   Pharynx: Oropharynx is clear.  ?  Eyes:  ?   General: No scleral icterus. ?   Extraocular Movements: Extraocular movements intact.  ?Cardiovascular:  ?   Rate and Rhythm: Normal rate and regular rhythm.  ?   Heart sounds: Normal heart sounds. No murmur heard. ?  No friction rub. No gallop.  ?Pulmonary:  ?   Effort: Pulmonary effort is normal. No respiratory distress.  ?   Breath sounds: Normal breath sounds. No wheezing, rhonchi or rales.  ?Abdominal:  ?   General: Bowel sounds are normal. There is no distension.  ?   Palpations: Abdomen is soft.  ?   Tenderness: There is no abdominal tenderness. There is no guarding or rebound.  ?Musculoskeletal:  ?   Cervical back: Neck supple.  ?   Right lower leg: No edema.  ?   Left lower leg: No edema.  ?Skin: ?   General: Skin is warm and dry.  ?   Coloration: Skin is not jaundiced or pale.  ?   Findings: Bruising (along forearms) present.  ?Neurological:  ?   General: No focal deficit present.  ?   Mental Status: He is alert and oriented to person, place, and time. Mental status is at baseline.  ?Psychiatric:     ?   Mood and Affect: Mood normal.     ?   Behavior: Behavior normal.      ?   Thought Content: Thought content normal.     ?   Judgment: Judgment normal.  ? ? ? ?Assessment:  ?[name] is a 61 y.o. male  who presents today for Esophagogastroduodenoscopy for Dark stool

## 2021-11-17 ENCOUNTER — Encounter: Payer: Self-pay | Admitting: Gastroenterology

## 2021-11-17 LAB — SURGICAL PATHOLOGY

## 2021-12-05 ENCOUNTER — Ambulatory Visit: Payer: BC Managed Care – PPO | Admitting: Nurse Practitioner

## 2021-12-05 ENCOUNTER — Encounter: Payer: Self-pay | Admitting: Nurse Practitioner

## 2021-12-05 VITALS — BP 110/68 | HR 67 | Ht 67.0 in | Wt 164.0 lb

## 2021-12-05 DIAGNOSIS — I10 Essential (primary) hypertension: Secondary | ICD-10-CM

## 2021-12-05 DIAGNOSIS — I2510 Atherosclerotic heart disease of native coronary artery without angina pectoris: Secondary | ICD-10-CM

## 2021-12-05 DIAGNOSIS — N183 Chronic kidney disease, stage 3 unspecified: Secondary | ICD-10-CM | POA: Diagnosis not present

## 2021-12-05 DIAGNOSIS — E785 Hyperlipidemia, unspecified: Secondary | ICD-10-CM | POA: Diagnosis not present

## 2021-12-05 DIAGNOSIS — K921 Melena: Secondary | ICD-10-CM

## 2021-12-05 MED ORDER — AMLODIPINE BESYLATE 10 MG PO TABS: 10.0000 mg | ORAL_TABLET | Freq: Every day | ORAL | 3 refills | Status: AC

## 2021-12-05 NOTE — Patient Instructions (Signed)
Medication Instructions:  No changes at this time.   *If you need a refill on your cardiac medications before your next appointment, please call your pharmacy*   Lab Work: None  If you have labs (blood work) drawn today and your tests are completely normal, you will receive your results only by: [place] (if you have MyChart) OR A paper copy in the mail If you have any lab test that is abnormal or we need to change your treatment, we will call you to review the results.   Testing/Procedures: None   Follow-Up: At [facility], you and your health needs are our priority.  As part of our continuing mission to provide you with exceptional heart care, we have created designated Provider Care Teams.  These Care Teams include your primary Cardiologist (physician) and Advanced Practice Providers (APPs -  Physician Assistants and Nurse Practitioners) who all work together to provide you with the care you need, when you need it.   Your next appointment:   6 month(s)  The format for your next appointment:   In Person  Provider:   Kate Sable, MD or Murray Hodgkins, NP       Important Information About Sugar

## 2021-12-05 NOTE — Progress Notes (Signed)
Office Visit    Patient Name: Jesse Ray Date of Encounter: 12/05/2021  Primary Care Provider:  Adin Hector, MD Primary Cardiologist:  Jesse Sable, MD  Chief Complaint    61 year old male with a history of CAD status post LAD stenting, diabetes, hypertension, hyperlipidemia, GERD, stage III chronic kidney disease, arteriovenous malformations, iron deficiency anemia, and psoriatic arthritis, who presents for follow-up of CAD.  Past Medical History    Past Medical History:  Diagnosis Date   CKD (chronic kidney disease), stage III ([place])    Coronary artery disease    a. 03/2020 Cardiac CT: Ca2+ 1424 (99th%'ile); b. 03/2020 Cath/PCI: LM nl, LAD 40ost/p, 85p (4.0x18 Resolute Onyx DES), 68m(3.0x18 Resolute Onyx DES), LCX min irregs, OM2 40, RCA 85p - nondominant; c. 06/2020 ETT: ex 7:21. No ECG changes. HTN response; d. 11/2020 MV: No isch/inf. EF 74%.   Di[place]    GERD (gastroesophageal reflux disease)    Hyperlipidemia LDL goal <70 04/11/2020   Hypertension    IDA (iron deficiency anemia) 02/11/2021   Localized arteriovenous malformations of intestinal tract    a. 01/2021 s/p APC; b. 10/2021 recurrent melena/anemia-->EGD localized mildly erythematous mucosa without active bleeding, and no stigmata of bleeding in duodenal bulb.   [place]    S/P angioplasty with stent 04/10/20 of proximal and mLAD DES and residual LAD stenosis 04/11/2020   Tubular adenoma    Past Surgical History:  Procedure Laterality Date   COLONOSCOPY WITH PROPOFOL N/A 01/23/2021   Procedure: COLONOSCOPY WITH PROPOFOL;  Surgeon: AJonathon Bellows MD;  [facility];  Service: Gastroenterology;  Laterality: N/A;   CORONARY STENT INTERVENTION N/A 04/10/2020   Procedure: CORONARY STENT INTERVENTION;  Surgeon: AWellington Hampshire MD;  [facility] [place] [place];  Service: Cardiovascular;  Laterality: N/A;  LAD    ESOPHAGOGASTRODUODENOSCOPY N/A  01/23/2021   Procedure: ESOPHAGOGASTRODUODENOSCOPY (EGD);  Surgeon: AJonathon Bellows MD;  [facility]  Service: Gastroenterology;  Laterality: N/A;   ESOPHAGOGASTRODUODENOSCOPY N/A 11/14/2021   Procedure: ESOPHAGOGASTRODUODENOSCOPY (EGD);  Surgeon: RAnnamaria Helling DO;  [facility];  Service: Gastroenterology;  Laterality: N/A;   GIVENS CAPSULE STUDY N/A 02/10/2021   Procedure: GIVENS CAPSULE STUDY;  Surgeon: AJonathon Bellows MD;  [facility];  Service: Gastroenterology;  Laterality: N/A;   INTRAVASCULAR ULTRASOUND/IVUS N/A 04/10/2020   Procedure: Intravascular Ultrasound/IVUS;  Surgeon: AWellington Hampshire MD;  [place] [facility];  Service: Cardiovascular;  Laterality: N/A;  LAD   LEFT HEART CATH AND CORONARY ANGIOGRAPHY N/A 04/01/2020   Procedure: LEFT HEART CATH AND CORONARY ANGIOGRAPHY;  Surgeon: AWellington Hampshire MD;  [facility];  Service: Cardiovascular;  Laterality: N/A;    Allergies  Allergies  Allergen Reactions   Atorvastatin     Other reaction(s): Muscle Pain    History of Present Illness    61year old male with the above past medical history including CAD, hypertension, hyperlipidemia, diabetes, stage III chronic kidney disease, GERD, arteriovenous malformations, iron deficiency anemia, and psoriatic arthritis.  In September 2021, he underwent coronary calcium scoring which was elevated at 1424 (99th percentile).  He established care with Dr. AGaren Lahand also reported exertional chest discomfort.  Diagnostic catheterization was performed and showed severe proximal and mid LAD disease as well as severe disease in a small, nondominant right coronary artery.  [facility]-eluting stent placement to the  proximal and mid LAD.  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  In May 2022, he reported a several month history of exertional chest pressure, especially when walking and carrying objects.  Isosorbide mononitrate was added to his regimen and a Lexiscan Myoview was performed, and was low risk without evidence of ischemia or infarct.  Unfortunately, he did not tolerate long-acting nitrate therapy and he continued to have symptoms.  There was initial plan for diagnostic catheterization however, precath labs revealed anemia with a hemoglobin of 7.1.  He was subsequently admitted to the hospital and transfused.  He was evaluated by GI and diagnosed with arteriovenous malformations in the duodenum APC.  Colonoscopy showed several polyps, which were resected, and pathology showed tubular adenomas.  He has since establish care with hematology and has received intravenous iron therapy.  Plavix was discontinued at the time of finding his anemia.  [name] was last seen in cardiology clinic in October 2022, at which time he was not having chest pain.  Unfortunately, he recently started noticing dark stools and was seen by his primary care provider.  Labs performed on April 28 showed a drop in H&H 10.7 and 32.4.  Aspirin was discontinued and follow-up labs on May 3 showed improvement in H&H to 11.0 and 34.2.  EGD on May 5, showed localized mildly erythematous mucosa without active bleeding or stigmata in the duodenal bulb.  Labs were stable on May 17 with an H&H of 11.2 and 32.4.  He has since been placed on plavix 75 mg daily w/ plan for periodic lab surveillance.  From a cardiac standpoint, [name] has felt well.  He remains relatively active though does not routinely exercise.  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  He denies chest pain, dyspnea, palpitations, PND, orthopnea, dizziness, syncope, edema, or early satiety.  Home Medications    Prior to Admission medications   Medication Sig Start Date End Date  Taking? Authorizing Provider  acetaminophen (TYLENOL) 325 MG tablet Take 2 tablets (650 mg total) by mouth every 4 (four) hours as needed for headache or mild pain. 04/11/20  Yes Isaiah Serge, NP  Adalimumab 40 MG/0.8ML PNKT Inject 0.4 mLs into the skin every 14 (fourteen) days.    Yes [provider]  albuterol (VENTOLIN HFA) 108 (90 Base) MCG/ACT inhaler SMARTSIG:2 Puff(s) By Mouth Every 6 Hours PRN 01/11/21  Yes [provider]  ascorbic acid (VITAMIN C) 1000 MG tablet Take 1,000 mg by mouth daily.    Yes [provider]  cetirizine (ZYRTEC) 5 MG tablet Take 10 mg by mouth daily.   Yes [provider]  Cholecalciferol 25 MCG (1000 UT) tablet Take 1,000 Units by mouth daily.    Yes [provider]  clobetasol cream (TEMOVATE) 1.61 % Apply 1 application topically 2 (two) times daily as needed (Psoriasis).  01/27/20  Yes [provider]  clopidogrel (PLAVIX) 75 MG tablet Take 75 mg by mouth daily. 12/03/21  Yes [provider]  Dulaglutide (TRULICITY) 0.96 EA/5.4UJ SOPN Inject into the skin.   Yes [provider]  glimepiride (AMARYL) 4 MG tablet Take 4 mg by mouth in the morning and at bedtime.   Yes [provider]  metFORMIN (GLUCOPHAGE) 1000 MG tablet Take 1 tablet (1,000 mg total) by mouth 2 (two) times daily with a meal. 04/13/20  Yes Isaiah Serge, NP  metoprolol succinate (TOPROL-XL) 25 MG 24 hr tablet Take 1 tablet (25 mg total) by mouth daily. 01/17/21  Yes Agbor-Etang, Aaron Edelman, MD  nitroGLYCERIN (NITROSTAT) 0.4 MG SL tablet Place 1 tablet (0.4 mg total) under the tongue every 5 (five) minutes as needed for chest pain. 04/11/20  Yes Isaiah Serge, NP  pantoprazole (PROTONIX) 40 MG tablet Take 1 tablet (40 mg total) by mouth 2 (two) times daily. 08/13/21  Yes Jonathon Bellows, MD  rosuvastatin (CRESTOR) 40 MG tablet TAKE 1 TABLET BY MOUTH AT BEDTIME 03/31/21  Yes Crenshaw, Denice Bors, MD  SYMBICORT 160-4.5 MCG/ACT inhaler  Inhale into the lungs as needed. 12/01/21  Yes [provider]  vitamin E 180 MG (400 UNITS) capsule Take 400 Units by mouth daily.    Yes [provider]  Zinc 30 MG TABS Take 25 mg by mouth daily.   Yes [provider]  amLODipine (NORVASC) 10 MG tablet Take 1 tablet (10 mg total) by mouth daily. 12/05/21   Theora Gianotti, NP       Review of Systems    Recent melena which has resolved.  He denies chest pain, palpitations, dyspnea, pnd, orthopnea, n, v, dizziness, syncope, edema, weight gain, or early satiety.  All other systems reviewed and are otherwise negative except as noted above.    Physical Exam    VS:  BP 110/68   Pulse 67   Ht '5\' 7"'$  (1.702 m)   Wt 164 lb (74.4 kg)   SpO2 97%   BMI 25.69 kg/m  , BMI Body mass index is 25.69 kg/m.     GEN: Well nourished, well developed, in no acute distress. HEENT: normal. Neck: Supple, no JVD, carotid bruits, or masses. Cardiac: RRR, no murmurs, rubs, or gallops. No clubbing, cyanosis, edema.  Radials/PT 2+ and equal bilaterally.  Respiratory:  Respirations regular and unlabored, clear to auscultation bilaterally. GI: Soft, nontender, nondistended, BS + x 4. MS: no deformity or atrophy. Skin: warm and dry, no rash. Neuro:  Strength and sensation are intact. Psych: Normal affect.  Accessory Clinical Findings    ECG personally reviewed by me today -regular sinus rhythm, 67- no acute changes.  Labs from care everywhere dated Nov 26, 2021  Hemoglobin 11.2, hematocrit 34.5, WBC 10.8, platelets 251 Sodium 141, potassium 4.5, chloride 106, CO2 26.5, BUN 29, creatinine 2.0, glucose 103 Calcium 9.6, albumin 4.2, total protein 7.6 Total bilirubin 0.7, alkaline phosphatase 73, AST 17, ALT 20 Hemoglobin A1c 7.1 Total cholesterol 84, triglycerides 81, HDL 35.4, LDL 32  Assessment & Plan    1.  Coronary artery disease: Status post drug-eluting stent placement x2 to the LAD with known residual small  vessel RCA disease, which has been managed medically.  Most recent Myoview May 2022 was without ischemia.  He has been doing well without chest pain or dyspnea and remains on beta-blocker and statin therapy.  Aspirin was recently discontinued in the setting of melena and anemia.  EGD did not show any evidence of bleeding and Plavix 75 mg daily was just resumed yesterday with plan for follow-up H&H in a few weeks.  Agree with remaining off of aspirin.  2.  Melena/GI bleed/iron deficiency anemia: Patient with prior GI bleeding and anemia requiring transfusion in July 2022 with finding of AVMs at that time.  Plavix was stopped at that time and he has been maintained on aspirin however, recently, he noted recurrent melena with slight drop in H&H.  Aspirin was discontinued and he was  seen by GI and underwent EGD showing erythematous gastric mucosa without bleeding or stigmata.  No AVMs were identified.  H&H has since improved to 11.2 and 34.5 respectively.  Plavix was just resumed yesterday with plan for follow-up H&H in a few weeks.  He remains on PPI therapy twice a day.  3.  Essential hypertension: Stable on beta-blocker and amlodipine.  4.  Hyperlipidemia: Recent LDL of 32 with normal LFTs.  Continue statin therapy.  5.  Stage III chronic kidney disease: Creatinine relatively stable at 2.0 earlier this month.  6.  Disposition: Patient is arrangement for follow-up labs with PCP in the next couple of weeks.  His primary care follow-up scheduled for 3 months we will follow-up here in 6 months or sooner if necessary.   Murray Hodgkins, NP 12/05/2021, 1:11 PM

## 2021-12-28 ENCOUNTER — Other Ambulatory Visit: Payer: Self-pay | Admitting: Cardiology

## 2022-01-22 ENCOUNTER — Other Ambulatory Visit: Payer: Self-pay | Admitting: Nephrology

## 2022-01-22 DIAGNOSIS — N1832 Chronic kidney disease, stage 3b: Secondary | ICD-10-CM

## 2022-01-22 DIAGNOSIS — E1122 Type 2 diabetes mellitus with diabetic chronic kidney disease: Secondary | ICD-10-CM

## 2022-01-30 ENCOUNTER — Ambulatory Visit: Payer: BC Managed Care – PPO

## 2022-02-06 ENCOUNTER — Ambulatory Visit
Admission: RE | Admit: 2022-02-06 | Discharge: 2022-02-06 | Disposition: A | Discharge status: Home / Self Care | Payer: BC Managed Care – PPO | Source: Ambulatory Visit | Attending: Nephrology | Admitting: Nephrology

## 2022-02-06 DIAGNOSIS — E1122 Type 2 diabetes mellitus with diabetic chronic kidney disease: Secondary | ICD-10-CM | POA: Insufficient documentation

## 2022-02-06 DIAGNOSIS — N1832 Chronic kidney disease, stage 3b: Secondary | ICD-10-CM | POA: Diagnosis present

## 2022-02-16 ENCOUNTER — Other Ambulatory Visit: Payer: BC Managed Care – PPO

## 2022-02-18 ENCOUNTER — Ambulatory Visit: Payer: BC Managed Care – PPO

## 2022-02-18 ENCOUNTER — Ambulatory Visit: Payer: BC Managed Care – PPO | Admitting: Oncology

## 2022-02-19 IMAGING — CT CT CARDIAC CORONARY ARTERY CALCIUM SCORE
3 series · 14 of 20 positions shown, 16 images · non-contrast
Comparison: None.
COMPARISON: None.

Addendum:
EXAM:
OVER-READ INTERPRETATION  CT CHEST

The following report is an over-read performed by radiologist Dr.
Esen Laz [REDACTED] on 03/22/2020. This
over-read does not include interpretation of cardiac or coronary
anatomy or pathology. The coronary calcium score interpretation by
the cardiologist is attached.
CLINICAL DATA: Risk stratification
Coronary Calcium Score
TECHNIQUE: The patient was scanned on a Siemens go.Top Scanner. Axial
non-contrast 3 mm slices were carried out through the heart. The
data set was analyzed on a dedicated work station and scored using
the Agatson method.

[Series 2: sa36 calcium scoring 3.00 · axial · 0.39mm/px · z∈[-1175,-1094]mm · 4 of 45 slices shown]
[im 9/45  vessel]
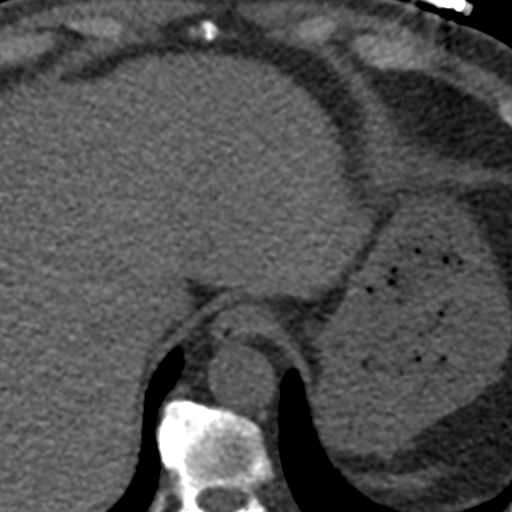
[im 18/45  vessel]
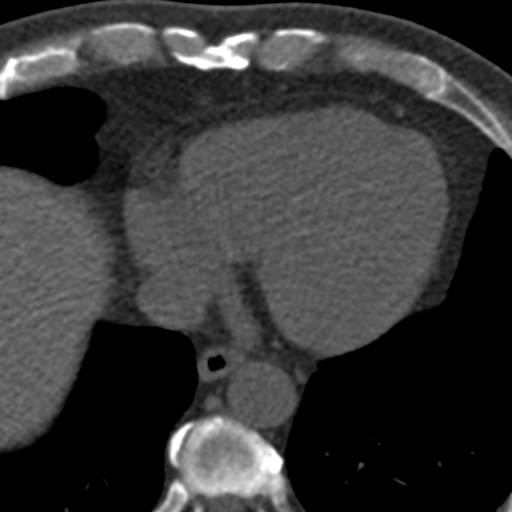
[im 27/45  vessel]
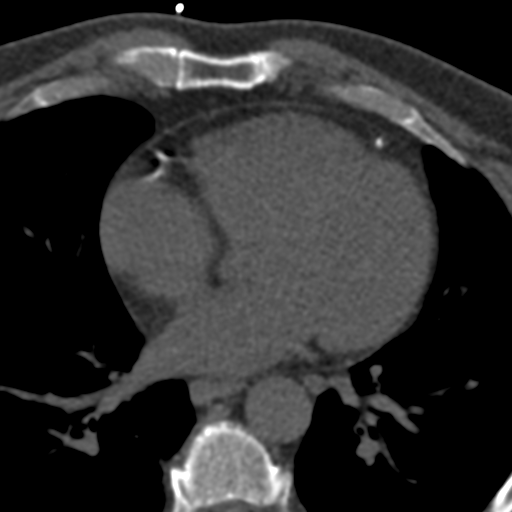
[im 36/45  vessel]
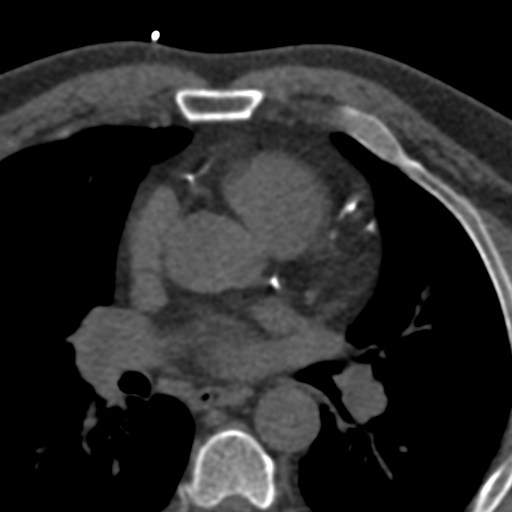

[Series 5: full fov st calcium scoring 3.00 · axial · 0.75mm/px · z∈[-1178,-1091]mm · 5 of 45 slices shown, 7 images]
[im 8/45  vessel]
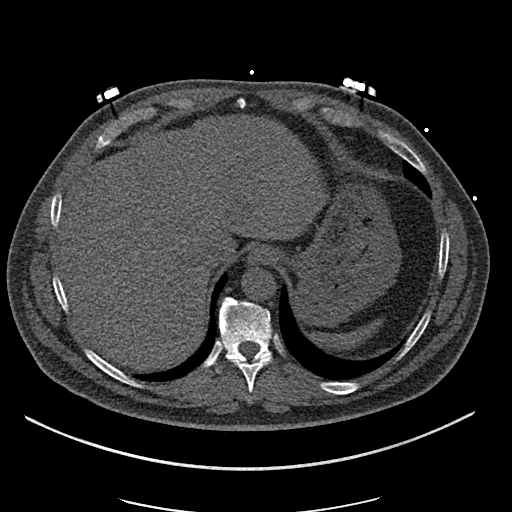
[im 8/45  lung]
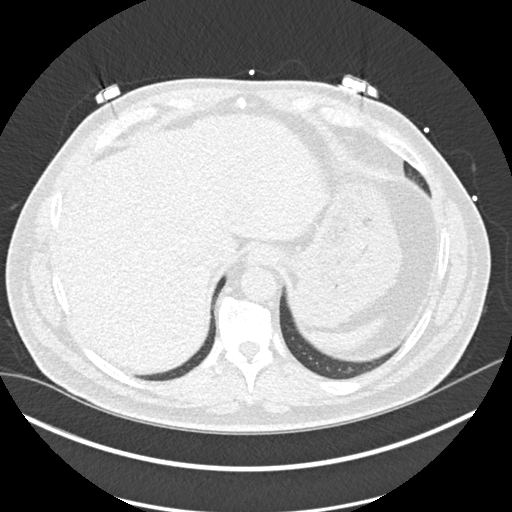
[im 15/45  vessel]
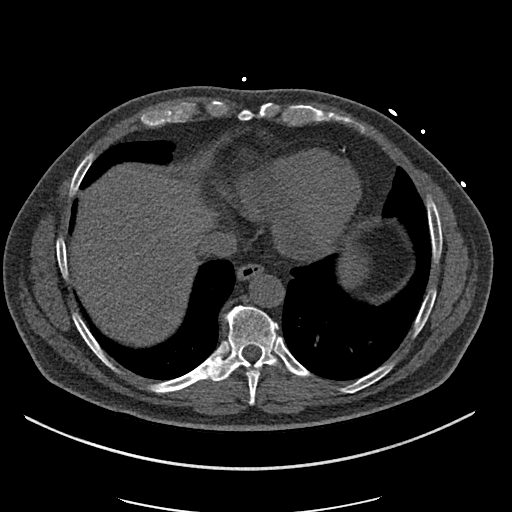
[im 23/45  vessel]
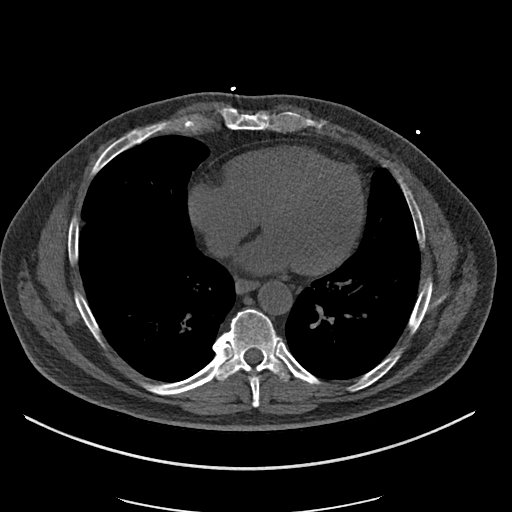
[im 30/45  vessel]
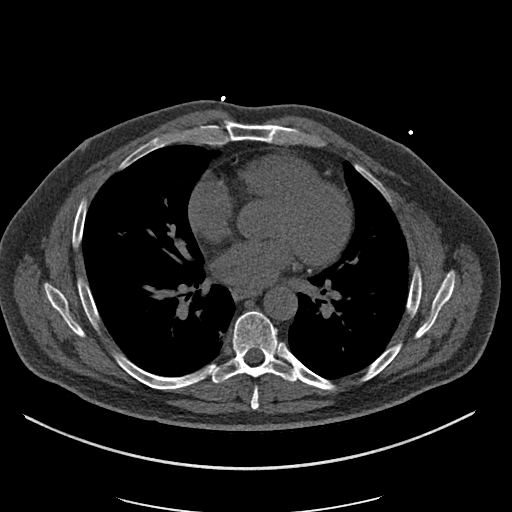
[im 37/45  vessel]
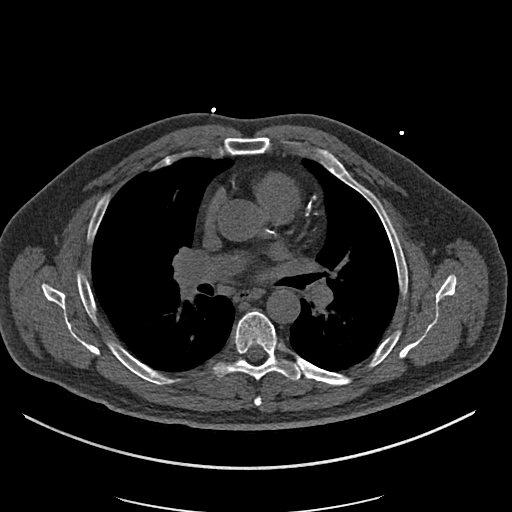
[im 37/45  lung]
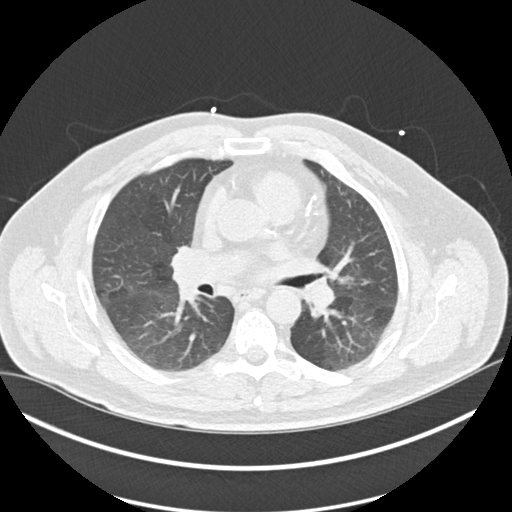

[Series 10: full fov lungs calcium scoring 3.00 ax · axial · 0.53mm/px · z∈[-1179,-1092]mm · 5 of 45 slices shown]
[im 8/45  vessel]
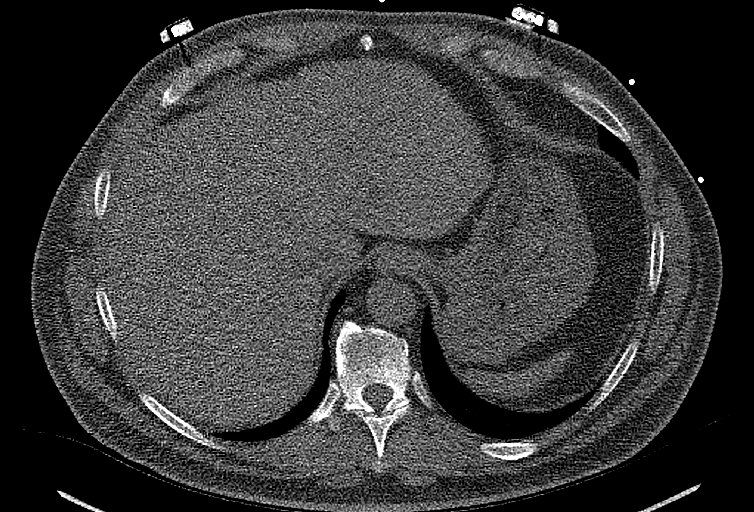
[im 15/45  vessel]
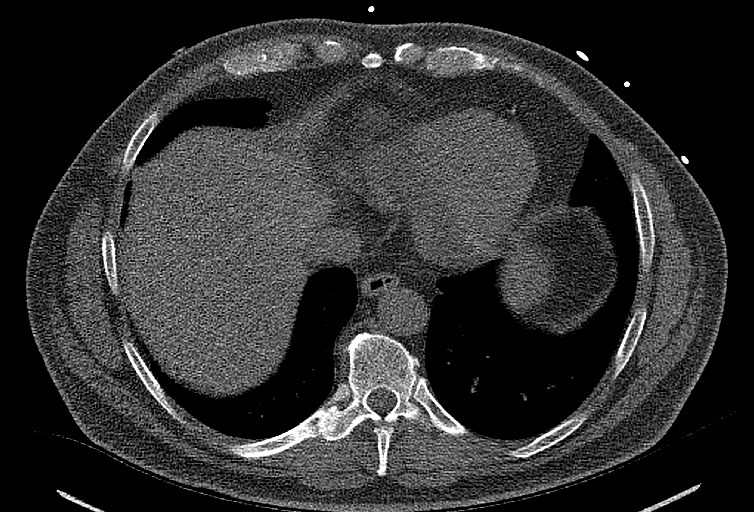
[im 23/45  vessel]
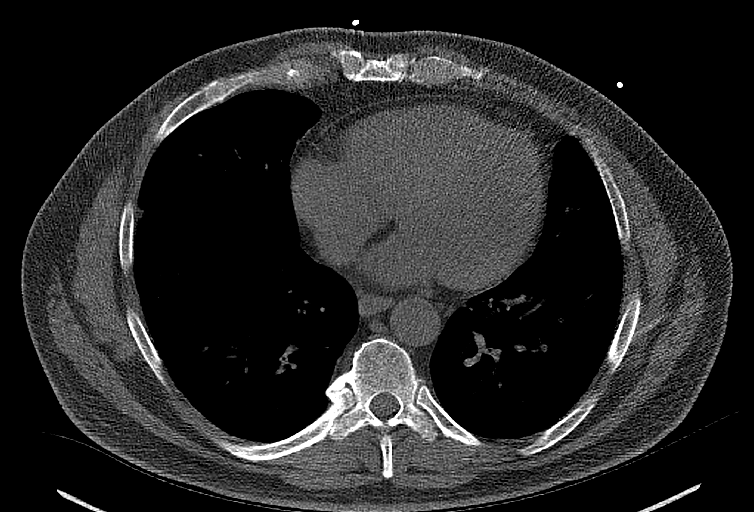
[im 30/45  vessel]
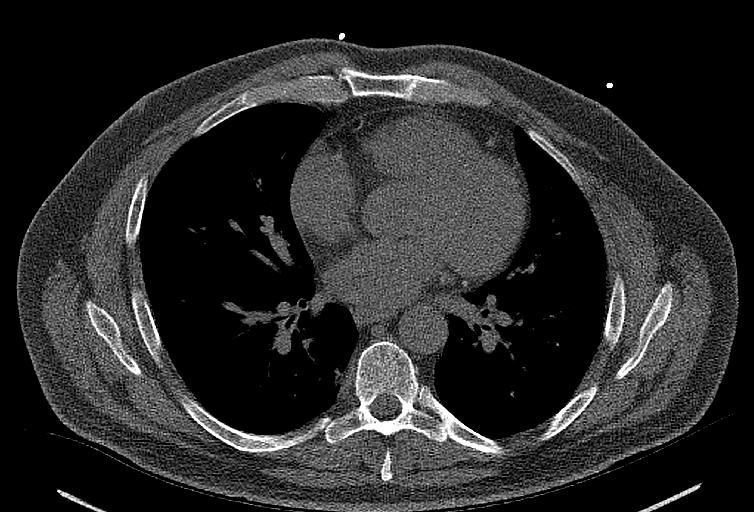
[im 37/45  vessel]
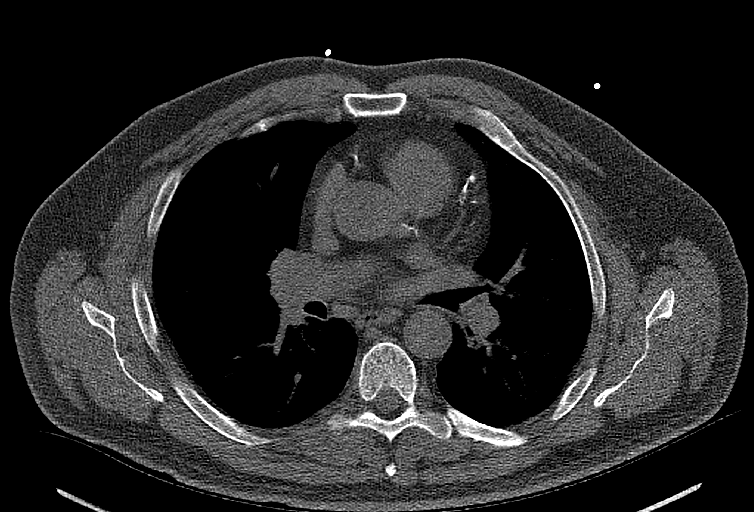

[14 of 20 positions shown; findings below may reference images not displayed]

FINDINGS: Aortic atherosclerosis. Mosaic attenuation throughout the lung
parenchyma suggesting air trapping within the visualized portions of
the thorax there are no suspicious appearing pulmonary nodules or
masses, there is no acute consolidative airspace disease, no pleural
effusions, no pneumothorax and no lymphadenopathy. Visualized
portions of the upper abdomen are unremarkable. There are no
aggressive appearing lytic or blastic lesions noted in the
visualized portions of the skeleton.
IMPRESSION: 1.  Aortic Atherosclerosis (3DJQR-8WP.P).
2. Probable air trapping in the lungs, suggestive of small airways
disease.
FINDINGS: Non-cardiac: See separate report from [REDACTED].

Ascending Aorta: Normal size

Pericardium: Normal

Coronary arteries: Normal origin of left and right coronary
arteries. Distribution of arterial calcifications if present, as
noted below;

LM 0

LCx 103

RCA 130

Total 7444
IMPRESSION: 1. High Coronary calcium score of 7444 . This was 99th percentile
for age and sex matched control.

2. Coronary calcifications involving all 3 coronary branches

3. Recommend asa and statin if no contraindications.

Floridalma Dun

*** End of Addendum ***
EXAM:
OVER-READ INTERPRETATION  CT CHEST

The following report is an over-read performed by radiologist Dr.
Esen Laz [REDACTED] on 03/22/2020. This
over-read does not include interpretation of cardiac or coronary
anatomy or pathology. The coronary calcium score interpretation by
the cardiologist is attached.
FINDINGS: Aortic atherosclerosis. Mosaic attenuation throughout the lung
parenchyma suggesting air trapping within the visualized portions of
the thorax there are no suspicious appearing pulmonary nodules or
masses, there is no acute consolidative airspace disease, no pleural
effusions, no pneumothorax and no lymphadenopathy. Visualized
portions of the upper abdomen are unremarkable. There are no
aggressive appearing lytic or blastic lesions noted in the
visualized portions of the skeleton.
IMPRESSION: 1.  Aortic Atherosclerosis (3DJQR-8WP.P).
2. Probable air trapping in the lungs, suggestive of small airways
disease.

## 2022-03-09 ENCOUNTER — Other Ambulatory Visit: Payer: Self-pay | Admitting: Cardiology

## 2022-03-09 ENCOUNTER — Other Ambulatory Visit: Payer: BC Managed Care – PPO

## 2022-03-11 ENCOUNTER — Ambulatory Visit: Payer: BC Managed Care – PPO | Admitting: Oncology

## 2022-03-11 ENCOUNTER — Ambulatory Visit: Payer: BC Managed Care – PPO

## 2022-03-17 ENCOUNTER — Inpatient Hospital Stay: Payer: BC Managed Care – PPO | Attending: Internal Medicine

## 2022-03-17 DIAGNOSIS — D500 Iron deficiency anemia secondary to blood loss (chronic): Secondary | ICD-10-CM | POA: Insufficient documentation

## 2022-03-17 DIAGNOSIS — N1831 Chronic kidney disease, stage 3a: Secondary | ICD-10-CM | POA: Diagnosis not present

## 2022-03-17 DIAGNOSIS — Z79899 Other long term (current) drug therapy: Secondary | ICD-10-CM | POA: Diagnosis not present

## 2022-03-17 DIAGNOSIS — E1122 Type 2 diabetes mellitus with diabetic chronic kidney disease: Secondary | ICD-10-CM | POA: Diagnosis not present

## 2022-03-17 DIAGNOSIS — I129 Hypertensive chronic kidney disease with stage 1 through stage 4 chronic kidney disease, or unspecified chronic kidney disease: Secondary | ICD-10-CM | POA: Insufficient documentation

## 2022-03-17 LAB — CBC WITH DIFFERENTIAL/PLATELET
Abs Immature Granulocytes: 0.02 10*3/uL (ref 0.00–0.07)
Basophils Absolute: 0 10*3/uL (ref 0.0–0.1)
Basophils Relative: 1 %
Eosinophils Absolute: 0.4 10*3/uL (ref 0.0–0.5)
Eosinophils Relative: 6 %
HCT: 34.8 % — ABNORMAL LOW (ref 39.0–52.0)
Hemoglobin: 11.7 g/dL — ABNORMAL LOW (ref 13.0–17.0)
Immature Granulocytes: 0 %
Lymphocytes Relative: 23 %
Lymphs Abs: 1.5 10*3/uL (ref 0.7–4.0)
MCH: 30.8 pg (ref 26.0–34.0)
MCHC: 33.6 g/dL (ref 30.0–36.0)
MCV: 91.6 fL (ref 80.0–100.0)
Monocytes Absolute: 0.5 10*3/uL (ref 0.1–1.0)
Monocytes Relative: 7 %
Neutro Abs: 4.2 10*3/uL (ref 1.7–7.7)
Neutrophils Relative %: 63 %
Platelets: 230 10*3/uL (ref 150–400)
RBC: 3.8 MIL/uL — ABNORMAL LOW (ref 4.22–5.81)
RDW: 12.8 % (ref 11.5–15.5)
WBC: 6.6 10*3/uL (ref 4.0–10.5)
nRBC: 0 % (ref 0.0–0.2)

## 2022-03-17 LAB — IRON AND TIBC
Iron: 63 ug/dL (ref 45–182)
Saturation Ratios: 16 % — ABNORMAL LOW (ref 17.9–39.5)
TIBC: 400 ug/dL (ref 250–450)
UIBC: 337 ug/dL

## 2022-03-17 LAB — FERRITIN: Ferritin: 11 ng/mL — ABNORMAL LOW (ref 24–336)

## 2022-03-18 ENCOUNTER — Other Ambulatory Visit: Payer: Self-pay | Admitting: Gastroenterology

## 2022-03-20 ENCOUNTER — Inpatient Hospital Stay (HOSPITAL_BASED_OUTPATIENT_CLINIC_OR_DEPARTMENT_OTHER): Payer: BC Managed Care – PPO | Admitting: Medical Oncology

## 2022-03-20 ENCOUNTER — Encounter: Payer: Self-pay | Admitting: Medical Oncology

## 2022-03-20 ENCOUNTER — Telehealth: Payer: Self-pay | Admitting: Medical Oncology

## 2022-03-20 ENCOUNTER — Inpatient Hospital Stay: Payer: BC Managed Care – PPO

## 2022-03-20 VITALS — BP 111/78 | HR 78 | Temp 98.7°F | Resp 20 | Wt 161.1 lb

## 2022-03-20 DIAGNOSIS — N1831 Chronic kidney disease, stage 3a: Secondary | ICD-10-CM | POA: Diagnosis not present

## 2022-03-20 DIAGNOSIS — D500 Iron deficiency anemia secondary to blood loss (chronic): Secondary | ICD-10-CM

## 2022-03-20 DIAGNOSIS — D631 Anemia in chronic kidney disease: Secondary | ICD-10-CM

## 2022-03-20 MED ORDER — SODIUM CHLORIDE 0.9 % IV SOLN
INTRAVENOUS | Status: DC
  Filled 2022-03-20: qty 250

## 2022-03-20 MED ORDER — SODIUM CHLORIDE 0.9 % IV SOLN
200.0000 mg | Freq: Once | INTRAVENOUS | Status: AC
  Administered 2022-03-20: 200 mg via INTRAVENOUS
  Filled 2022-03-20: qty 200

## 2022-03-20 NOTE — Telephone Encounter (Signed)
LOS from 03/20/22 Weekly venofer x5  RTC 3 months with me, labs 1-7 days prior (CBC, Ferritin, iron/TIBC, cmp  Pt is going to call back to schedule. 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

## 2022-03-20 NOTE — Progress Notes (Signed)
Hematology/Oncology Consult note [facility] [phone] [phone]   Patient Care Team: Adin Hector, MD as PCP - General (Internal Medicine) Kate Sable, MD as PCP - Cardiology (Cardiology)  REFERRING PROVIDER: Adin Hector, MD  CHIEF COMPLAINTS/REASON FOR VISIT:  Evaluation of iron deficiency anemia  HISTORY OF PRESENTING ILLNESS:   Jesse Ray is a  61 y.o.  male with PMH listed below was seen in consultation at the request of  Adin Hector, MD  for evaluation of iron deficiency anemia.  Patient was accompanied by wife today  01/18/2021-01/20/2021, patient was admitted after being found to have abnormal lab work which were done in anticipation of upcoming heart catheterization.  Patient has reported shortness of breath and chest pain. Hemoglobin was 7.1 upon admission down from 10.2 at baseline in September 2021.  He was also found to have a creatinine of 1.8.  Iron panel indicate iron deficiency.  Patient was transfused 2 units of PRBC.  Plavix was held during admission for 5 days.  Aspirin was continued.  Renal ultrasound was obtained during admission was unremarkable.  Patient has a history of CAD, status post PCI with DES in September 2021.  01/24/2021, patient underwent EGD and colonoscopy.  Colonoscopy showed several polyps in the colon which were resected and retrieved.  Pathology showed tubular adenoma, negative high-grade dysplasia or malignancy.  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 02/10/2021, patient underwent capsule study.  Results are pending.  01/23/2021, repeat iron panel confirmed persistently decreased iron deficiency with iron saturation at 8, ferritin 13. Today patient feels fatigued.  Denies any black or bloody stool.  No nausea vomiting diarrhea, no intentional weight loss.  Chest pain has improved after blood transfusion.  Interval History: Since his last visit with  our office he has undergone an EGD which showed gastritis and duodenitis. No active bleeding noted. He reports that he is feeling well but chronically tired. No constipation, bloody stools, night sweats, unintentional weight loss. Not taking oral iron supplementation because he had been on IV Venofer (Last infusion was on 06/17/2021.) Tolerated well.   Review of Systems  Constitutional:  Positive for fatigue. Negative for appetite change, chills, fever and unexpected weight change.  HENT:   Negative for hearing loss and voice change.   Eyes:  Negative for eye problems and icterus.  Respiratory:  Negative for chest tightness, cough and shortness of breath.   Cardiovascular:  Negative for chest pain and leg swelling.  Gastrointestinal:  Negative for abdominal distention and abdominal pain.  Endocrine: Negative for hot flashes.  Genitourinary:  Negative for difficulty urinating, dysuria and frequency.   Musculoskeletal:  Negative for arthralgias.  Skin:  Negative for itching and rash.  Neurological:  Negative for light-headedness and numbness.  Hematological:  Negative for adenopathy. Does not bruise/bleed easily.  Psychiatric/Behavioral:  Negative for confusion.     MEDICAL HISTORY:  Past Medical History:  Diagnosis Date   CKD (chronic kidney disease), stage III ([place])    Coronary artery disease    a. 03/2020 Cardiac CT: Ca2+ 1424 (99th%'ile); b. 03/2020 Cath/PCI: LM nl, LAD 40ost/p, 85p (4.0x18 Resolute Onyx DES), 19m(3.0x18 Resolute Onyx DES), LCX min irregs, OM2 40, RCA 85p - nondominant; c. 06/2020 ETT: ex 7:21. No ECG changes. HTN response; d. 11/2020 MV: No isch/inf. EF 74%.   Diabetes mellitus without complication (HCC)    GERD (gastroesophageal reflux disease)    Hyperlipidemia LDL goal <70 04/11/2020  Hypertension    IDA (iron deficiency anemia) 02/11/2021   Localized arteriovenous malformations of intestinal tract    a. 01/2021 s/p APC; b. 10/2021 recurrent melena/anemia-->EGD  localized mildly erythematous mucosa without active bleeding, and no stigmata of bleeding in duodenal bulb.   Psoriatic arthritis ([place])    S/P angioplasty with stent 04/10/20 of proximal and mLAD DES and residual LAD stenosis 04/11/2020   Tubular adenoma     SURGICAL HISTORY: Past Surgical History:  Procedure Laterality Date   COLONOSCOPY WITH PROPOFOL N/A 01/23/2021   Procedure: COLONOSCOPY WITH PROPOFOL;  Surgeon: Jonathon Bellows, MD;  Location: [facility] ENDOSCOPY;  Service: Gastroenterology;  Laterality: N/A;   CORONARY STENT INTERVENTION N/A 04/10/2020   Procedure: CORONARY STENT INTERVENTION;  Surgeon: Wellington Hampshire, MD;  Location: [place] CV LAB;  Service: Cardiovascular;  Laterality: N/A;  LAD    ESOPHAGOGASTRODUODENOSCOPY N/A 01/23/2021   Procedure: ESOPHAGOGASTRODUODENOSCOPY (EGD);  Surgeon: Jonathon Bellows, MD;  Location[facility] ENDOSCOPY;  Service: Gastroenterology;  Laterality: N/A;   ESOPHAGOGASTRODUODENOSCOPY N/A 11/14/2021   Procedure: ESOPHAGOGASTRODUODENOSCOPY (EGD);  Surgeon: Annamaria Helling, DO;  Location: [facility] ENDOSCOPY;  Service: Gastroenterology;  Laterality: N/A;   GIVENS CAPSULE STUDY N/A 02/10/2021   Procedure: GIVENS CAPSULE STUDY;  Surgeon: Jonathon Bellows, MD;  Location: [facility] ENDOSCOPY;  Service: Gastroenterology;  Laterality: N/A;   INTRAVASCULAR ULTRASOUND/IVUS N/A 04/10/2020   Procedure: Intravascular Ultrasound/IVUS;  Surgeon: Wellington Hampshire, MD;  Location: [place] CV LAB;  Service: Cardiovascular;  Laterality: N/A;  LAD   LEFT HEART CATH AND CORONARY ANGIOGRAPHY N/A 04/01/2020   Procedure: LEFT HEART CATH AND CORONARY ANGIOGRAPHY;  Surgeon: Wellington Hampshire, MD;  Location: [place] CV LAB;  Service: Cardiovascular;  Laterality: N/A;    SOCIAL HISTORY: Social History   Socioeconomic History   Marital status: Married    Spouse name: Not on file   Number of children: Not on file   Years of education: Not on file   Highest education level: Not on file   Occupational History   Not on file  Tobacco Use   Smoking status: Former    Years: 30.00    Types: Cigarettes    Quit date: 2009    Years since quitting: 14.6   Smokeless tobacco: Never  Vaping Use   Vaping Use: Never used  Substance and Sexual Activity   Alcohol use: Yes    Comment: occasional alcohol intake   Drug use: Never   Sexual activity: Yes  Other Topics Concern   Not on file  Social History Narrative   Not on file   Social Determinants of Health   Financial Resource Strain: Not on file  Food Insecurity: Not on file  Transportation Needs: Not on file  Physical Activity: Not on file  Stress: Not on file  Social Connections: Not on file  Intimate Partner Violence: Not on file    FAMILY HISTORY: Family History  Problem Relation Age of Onset   Dementia Mother    Heart disease Father    Throat cancer Maternal Grandfather     ALLERGIES:  is allergic to atorvastatin.  MEDICATIONS:  Current Outpatient Medications  Medication Sig Dispense Refill   acetaminophen (TYLENOL) 325 MG tablet Take 2 tablets (650 mg total) by mouth every 4 (four) hours as needed for headache or mild pain.     Adalimumab 40 MG/0.8ML PNKT Inject 0.4 mLs into the skin every 14 (fourteen) days.      albuterol (VENTOLIN HFA) 108 (90 Base) MCG/ACT inhaler SMARTSIG:2 Puff(s)  By Mouth Every 6 Hours PRN     amLODipine (NORVASC) 10 MG tablet Take 1 tablet (10 mg total) by mouth daily. 90 tablet 3   ascorbic acid (VITAMIN C) 1000 MG tablet Take 1,000 mg by mouth daily.      cetirizine (ZYRTEC) 5 MG tablet Take 10 mg by mouth daily.     Cholecalciferol 25 MCG (1000 UT) tablet Take 1,000 Units by mouth daily.      clobetasol cream (TEMOVATE) 7.84 % Apply 1 application topically 2 (two) times daily as needed (Psoriasis).      clopidogrel (PLAVIX) 75 MG tablet Take 75 mg by mouth daily.     cyanocobalamin 1000 MCG tablet Take by mouth.     Dulaglutide (TRULICITY) 6.96 EX/5.2WU SOPN Inject into the  skin.     FARXIGA 10 MG TABS tablet Take 10 mg by mouth every morning.     glimepiride (AMARYL) 4 MG tablet Take 4 mg by mouth in the morning and at bedtime.     losartan (COZAAR) 25 MG tablet Take 25 mg by mouth daily.     metFORMIN (GLUCOPHAGE) 1000 MG tablet Take 1 tablet (1,000 mg total) by mouth 2 (two) times daily with a meal.     metoprolol succinate (TOPROL-XL) 25 MG 24 hr tablet Take 1 tablet (25 mg total) by mouth daily. 90 tablet 3   nitroGLYCERIN (NITROSTAT) 0.4 MG SL tablet Place 1 tablet (0.4 mg total) under the tongue every 5 (five) minutes as needed for chest pain. 25 tablet 4   pantoprazole (PROTONIX) 40 MG tablet Take 1 tablet by mouth twice daily 180 tablet 0   rosuvastatin (CRESTOR) 40 MG tablet TAKE 1 TABLET BY MOUTH AT BEDTIME 90 tablet 0   SYMBICORT 160-4.5 MCG/ACT inhaler Inhale into the lungs as needed.     vitamin E 180 MG (400 UNITS) capsule Take 400 Units by mouth daily.      Zinc 30 MG TABS Take 25 mg by mouth daily.     Current Facility-Administered Medications  Medication Dose Route Frequency Provider Last Rate Last Admin   sodium chloride flush (NS) 0.9 % injection 3 mL  3 mL Intravenous Q12H Agbor-Etang, Aaron Edelman, MD       Facility-Administered Medications Ordered in Other Visits  Medication Dose Route Frequency Provider Last Rate Last Admin   0.9 %  sodium chloride infusion   Intravenous Continuous Earlie Server, MD 10 mL/hr at 03/20/22 1402 New Bag at 03/20/22 1402   iron sucrose (VENOFER) 200 mg in sodium chloride 0.9 % 100 mL IVPB  200 mg Intravenous Once Earlie Server, MD 440 mL/hr at 03/20/22 1403 200 mg at 03/20/22 1403     PHYSICAL EXAMINATION: ECOG PERFORMANCE STATUS: 1 - Symptomatic but completely ambulatory Vitals:   03/20/22 1304  BP: 111/78  Pulse: 78  Resp: 20  Temp: 98.7 F (37.1 C)  SpO2: 99%   Filed Weights   03/20/22 1304  Weight: 161 lb 1.6 oz (73.1 kg)    Physical Exam Constitutional:      General: He is not in acute distress. HENT:      Head: Normocephalic and atraumatic.  Eyes:     General: No scleral icterus. Cardiovascular:     Rate and Rhythm: Normal rate and regular rhythm.     Heart sounds: Normal heart sounds.  Pulmonary:     Effort: Pulmonary effort is normal. No respiratory distress.     Breath sounds: No wheezing.  Abdominal:  General: Bowel sounds are normal. There is no distension.     Palpations: Abdomen is soft.  Musculoskeletal:        General: No deformity. Normal range of motion.     Cervical back: Normal range of motion and neck supple.  Skin:    General: Skin is warm and dry.     Findings: No erythema or rash.  Neurological:     Mental Status: He is alert and oriented to person, place, and time. Mental status is at baseline.     Cranial Nerves: No cranial nerve deficit.     Coordination: Coordination normal.  Psychiatric:        Mood and Affect: Mood normal.     LABORATORY DATA:  I have reviewed the data as listed Lab Results  Component Value Date   WBC 6.6 03/17/2022   HGB 11.7 (L) 03/17/2022   HCT 34.8 (L) 03/17/2022   MCV 91.6 03/17/2022   PLT 230 03/17/2022   No results for input(s): "NA", "K", "CL", "CO2", "GLUCOSE", "BUN", "CREATININE", "CALCIUM", "GFRNONAA", "GFRAA", "PROT", "ALBUMIN", "AST", "ALT", "ALKPHOS", "BILITOT", "BILIDIR", "IBILI" in the last 8760 hours.  Iron/TIBC/Ferritin/ %Sat    Component Value Date/Time   IRON 63 03/17/2022 0814   IRON 35 (L) 01/23/2021 1343   TIBC 400 03/17/2022 0814   TIBC 436 01/23/2021 1343   FERRITIN 11 (L) 03/17/2022 0814   FERRITIN 13 (L) 01/23/2021 1343   IRONPCTSAT 16 (L) 03/17/2022 0814   IRONPCTSAT 8 (LL) 01/23/2021 1343      RADIOGRAPHIC STUDIES: I have personally reviewed the radiological images as listed and agreed with the findings in the report. No results found.    ASSESSMENT & PLAN:  No diagnosis found.  Iron deficiency anemia secondary to chronic blood loss: Previously due to Plavix. Now back on plavix  and hgb had been stable. No known recent bleeding. Low iron intake through diet which may be contributing along with chronic gastritis..Not on iron supplementation. See below.   Chronic kidney disease, stage IIIa: Chronic and stable. Per Dr. Tasia Catchings "In the context of chronic kidney disease, I recommend aggressive iron supplementation to keep ferritin at least above 100, preferably above 200.  This probably needs to be achieved with IV Venofer treatments." He has tolerated this well previously. We will restart his Venofer treatments today. Weekly x 5 total. Following his 5th infusion he will start gentle oral iron as a trial.   Continue follow-up with GI  Venofer today Venover weekly x 5 weeks total  RTC 3 months with me, labs 1-7 days prior (CBC, Ferritin, iron/TIBC, cmp)-[place]  No orders of the defined types were placed in this encounter.   All questions were answered. The patient knows to call the clinic with any problems questions or concerns.  cc Adin Hector, MD   Nelwyn Salisbury PA-C [place] [facility] 03/20/2022

## 2022-03-24 ENCOUNTER — Inpatient Hospital Stay: Payer: BC Managed Care – PPO

## 2022-03-24 ENCOUNTER — Other Ambulatory Visit: Payer: Self-pay

## 2022-03-24 VITALS — BP 139/74 | HR 76 | Temp 96.9°F | Resp 18

## 2022-03-24 DIAGNOSIS — D500 Iron deficiency anemia secondary to blood loss (chronic): Secondary | ICD-10-CM

## 2022-03-24 MED ORDER — METOPROLOL SUCCINATE ER 25 MG PO TB24: 25.0000 mg | ORAL_TABLET | Freq: Every day | ORAL | 0 refills | Status: DC

## 2022-03-24 MED ORDER — SODIUM CHLORIDE 0.9 % IV SOLN
200.0000 mg | Freq: Once | INTRAVENOUS | Status: AC
  Administered 2022-03-24: 200 mg via INTRAVENOUS
  Filled 2022-03-24: qty 200

## 2022-03-24 MED ORDER — SODIUM CHLORIDE 0.9 % IV SOLN
INTRAVENOUS | Status: DC
  Filled 2022-03-24: qty 250

## 2022-03-24 NOTE — Patient Instructions (Signed)
[facility]  Discharge Instructions: Thank you for choosing [facility] to provide your oncology and hematology care.  If you have a lab appointment with the Cancer Center, please go directly to the Cancer Center and check in at the registration area.  Wear comfortable clothing and clothing appropriate for easy access to any Portacath or PICC line.   We strive to give you quality time with your provider. You may need to reschedule your appointment if you arrive late (15 or more minutes).  Arriving late affects you and other patients whose appointments are after yours.  Also, if you miss three or more appointments without notifying the office, you may be dismissed from the clinic at the provider's discretion.      For prescription refill requests, have your pharmacy contact our office and allow 72 hours for refills to be completed.    Today you received the following chemotherapy and/or immunotherapy agents VENOFER      To help prevent nausea and vomiting after your treatment, we encourage you to take your nausea medication as directed.  BELOW ARE SYMPTOMS THAT SHOULD BE REPORTED IMMEDIATELY: *FEVER GREATER THAN 100.4 F (38 C) OR HIGHER *CHILLS OR SWEATING *NAUSEA AND VOMITING THAT IS NOT CONTROLLED WITH YOUR NAUSEA MEDICATION *UNUSUAL SHORTNESS OF BREATH *UNUSUAL BRUISING OR BLEEDING *URINARY PROBLEMS (pain or burning when urinating, or frequent urination) *BOWEL PROBLEMS (unusual diarrhea, constipation, pain near the anus) TENDERNESS IN MOUTH AND THROAT WITH OR WITHOUT PRESENCE OF ULCERS (sore throat, sores in mouth, or a toothache) UNUSUAL RASH, SWELLING OR PAIN  UNUSUAL VAGINAL DISCHARGE OR ITCHING   Items with * indicate a potential emergency and should be followed up as soon as possible or go to the Emergency Department if any problems should occur.  Please show the CHEMOTHERAPY ALERT CARD or IMMUNOTHERAPY ALERT CARD at check-in to the  Emergency Department and triage nurse.  Should you have questions after your visit or need to cancel or reschedule your appointment, please contact [facility]  [phone] and follow the prompts.  Office hours are 8:00 a.m. to 4:30 p.m. Monday - Friday. Please note that voicemails left after 4:00 p.m. may not be returned until the following business day.  We are closed weekends and major holidays. You have access to a nurse at all times for urgent questions. Please call the main number to the clinic [phone] and follow the prompts.  For any non-urgent questions, you may also contact your provider using MyChart. We now offer e-Visits for anyone 18 and older to request care online for non-urgent symptoms. For details visit [email or web address].   Also download the MyChart app! Go to the app store, search "MyChart", open the app, select [facility], and log in with your MyChart username and password.  Masks are optional in the cancer centers. If you would like for your care team to wear a mask while they are taking care of you, please let them know. For doctor visits, patients may have with them one support person who is at least 59 years old. At this time, visitors are not allowed in the infusion area.  Iron Sucrose Injection What is this medication? IRON SUCROSE (EYE ern SOO krose) treats low levels of iron (iron deficiency anemia) in people with kidney disease. Iron is a mineral that plays an important role in making red blood cells, which carry oxygen from your lungs to the rest of your body. This medicine may be   used for other purposes; ask your health care provider or pharmacist if you have questions. COMMON BRAND NAME(S): Venofer What should I tell my care team before I take this medication? They need to know if you have any of these conditions: Anemia not caused by low iron levels Heart disease High levels of iron in the blood Kidney disease Liver  disease An unusual or allergic reaction to iron, other medications, foods, dyes, or preservatives Pregnant or trying to get pregnant Breast-feeding How should I use this medication? This medication is for infusion into a vein. It is given in a hospital or clinic setting. Talk to your care team about the use of this medication in children. While this medication may be prescribed for children as young as 2 years for selected conditions, precautions do apply. Overdosage: If you think you have taken too much of this medicine contact a poison control center or emergency room at once. NOTE: This medicine is only for you. Do not share this medicine with others. What if I miss a dose? It is important not to miss your dose. Call your care team if you are unable to keep an appointment. What may interact with this medication? Do not take this medication with any of the following: Deferoxamine Dimercaprol Other iron products This medication may also interact with the following: Chloramphenicol Deferasirox This list may not describe all possible interactions. Give your health care provider a list of all the medicines, herbs, non-prescription drugs, or dietary supplements you use. Also tell them if you smoke, drink alcohol, or use illegal drugs. Some items may interact with your medicine. What should I watch for while using this medication? Visit your care team regularly. Tell your care team if your symptoms do not start to get better or if they get worse. You may need blood work done while you are taking this medication. You may need to follow a special diet. Talk to your care team. Foods that contain iron include: whole grains/cereals, dried fruits, beans, or peas, leafy green vegetables, and organ meats (liver, kidney). What side effects may I notice from receiving this medication? Side effects that you should report to your care team as soon as possible: Allergic reactions--skin rash, itching, hives,  swelling of the face, lips, tongue, or throat Low blood pressure--dizziness, feeling faint or lightheaded, blurry vision Shortness of breath Side effects that usually do not require medical attention (report to your care team if they continue or are bothersome): Flushing Headache Joint pain Muscle pain Nausea Pain, redness, or irritation at injection site This list may not describe all possible side effects. Call your doctor for medical advice about side effects. You may report side effects to FDA at 1-800-FDA-1088. Where should I keep my medication? This medication is given in a hospital or clinic and will not be stored at home. NOTE: This sheet is a summary. It may not cover all possible information. If you have questions about this medicine, talk to your doctor, pharmacist, or health care provider.  2023 Elsevier/Gold Standard (2007-08-20 00:00:00)   

## 2022-03-30 MED FILL — Iron Sucrose Inj 20 MG/ML (Fe Equiv): INTRAVENOUS | Qty: 10 | Status: AC

## 2022-03-31 ENCOUNTER — Inpatient Hospital Stay: Payer: BC Managed Care – PPO

## 2022-03-31 VITALS — BP 137/68 | HR 77 | Temp 96.3°F | Resp 18

## 2022-03-31 DIAGNOSIS — D500 Iron deficiency anemia secondary to blood loss (chronic): Secondary | ICD-10-CM | POA: Diagnosis not present

## 2022-03-31 MED ORDER — SODIUM CHLORIDE 0.9 % IV SOLN
INTRAVENOUS | Status: DC
  Filled 2022-03-31: qty 250

## 2022-03-31 MED ORDER — SODIUM CHLORIDE 0.9 % IV SOLN
200.0000 mg | Freq: Once | INTRAVENOUS | Status: AC
  Administered 2022-03-31: 200 mg via INTRAVENOUS
  Filled 2022-03-31: qty 200

## 2022-03-31 NOTE — Patient Instructions (Signed)
[facility]  Discharge Instructions: Thank you for choosing [facility] to provide your oncology and hematology care.  If you have a lab appointment with the Cancer Center, please go directly to the Cancer Center and check in at the registration area.  Wear comfortable clothing and clothing appropriate for easy access to any Portacath or PICC line.   We strive to give you quality time with your provider. You may need to reschedule your appointment if you arrive late (15 or more minutes).  Arriving late affects you and other patients whose appointments are after yours.  Also, if you miss three or more appointments without notifying the office, you may be dismissed from the clinic at the provider's discretion.      For prescription refill requests, have your pharmacy contact our office and allow 72 hours for refills to be completed.    Today you received the following chemotherapy and/or immunotherapy agents VENOFER      To help prevent nausea and vomiting after your treatment, we encourage you to take your nausea medication as directed.  BELOW ARE SYMPTOMS THAT SHOULD BE REPORTED IMMEDIATELY: *FEVER GREATER THAN 100.4 F (38 C) OR HIGHER *CHILLS OR SWEATING *NAUSEA AND VOMITING THAT IS NOT CONTROLLED WITH YOUR NAUSEA MEDICATION *UNUSUAL SHORTNESS OF BREATH *UNUSUAL BRUISING OR BLEEDING *URINARY PROBLEMS (pain or burning when urinating, or frequent urination) *BOWEL PROBLEMS (unusual diarrhea, constipation, pain near the anus) TENDERNESS IN MOUTH AND THROAT WITH OR WITHOUT PRESENCE OF ULCERS (sore throat, sores in mouth, or a toothache) UNUSUAL RASH, SWELLING OR PAIN  UNUSUAL VAGINAL DISCHARGE OR ITCHING   Items with * indicate a potential emergency and should be followed up as soon as possible or go to the Emergency Department if any problems should occur.  Please show the CHEMOTHERAPY ALERT CARD or IMMUNOTHERAPY ALERT CARD at check-in to the  Emergency Department and triage nurse.  Should you have questions after your visit or need to cancel or reschedule your appointment, please contact [facility]  [phone] and follow the prompts.  Office hours are 8:00 a.m. to 4:30 p.m. Monday - Friday. Please note that voicemails left after 4:00 p.m. may not be returned until the following business day.  We are closed weekends and major holidays. You have access to a nurse at all times for urgent questions. Please call the main number to the clinic [phone] and follow the prompts.  For any non-urgent questions, you may also contact your provider using MyChart. We now offer e-Visits for anyone 18 and older to request care online for non-urgent symptoms. For details visit [email or web address].   Also download the MyChart app! Go to the app store, search "MyChart", open the app, select [facility], and log in with your MyChart username and password.  Masks are optional in the cancer centers. If you would like for your care team to wear a mask while they are taking care of you, please let them know. For doctor visits, patients may have with them one support person who is at least 59 years old. At this time, visitors are not allowed in the infusion area.  Iron Sucrose Injection What is this medication? IRON SUCROSE (EYE ern SOO krose) treats low levels of iron (iron deficiency anemia) in people with kidney disease. Iron is a mineral that plays an important role in making red blood cells, which carry oxygen from your lungs to the rest of your body. This medicine may be   used for other purposes; ask your health care provider or pharmacist if you have questions. COMMON BRAND NAME(S): Venofer What should I tell my care team before I take this medication? They need to know if you have any of these conditions: Anemia not caused by low iron levels Heart disease High levels of iron in the blood Kidney disease Liver  disease An unusual or allergic reaction to iron, other medications, foods, dyes, or preservatives Pregnant or trying to get pregnant Breast-feeding How should I use this medication? This medication is for infusion into a vein. It is given in a hospital or clinic setting. Talk to your care team about the use of this medication in children. While this medication may be prescribed for children as young as 2 years for selected conditions, precautions do apply. Overdosage: If you think you have taken too much of this medicine contact a poison control center or emergency room at once. NOTE: This medicine is only for you. Do not share this medicine with others. What if I miss a dose? It is important not to miss your dose. Call your care team if you are unable to keep an appointment. What may interact with this medication? Do not take this medication with any of the following: Deferoxamine Dimercaprol Other iron products This medication may also interact with the following: Chloramphenicol Deferasirox This list may not describe all possible interactions. Give your health care provider a list of all the medicines, herbs, non-prescription drugs, or dietary supplements you use. Also tell them if you smoke, drink alcohol, or use illegal drugs. Some items may interact with your medicine. What should I watch for while using this medication? Visit your care team regularly. Tell your care team if your symptoms do not start to get better or if they get worse. You may need blood work done while you are taking this medication. You may need to follow a special diet. Talk to your care team. Foods that contain iron include: whole grains/cereals, dried fruits, beans, or peas, leafy green vegetables, and organ meats (liver, kidney). What side effects may I notice from receiving this medication? Side effects that you should report to your care team as soon as possible: Allergic reactions--skin rash, itching, hives,  swelling of the face, lips, tongue, or throat Low blood pressure--dizziness, feeling faint or lightheaded, blurry vision Shortness of breath Side effects that usually do not require medical attention (report to your care team if they continue or are bothersome): Flushing Headache Joint pain Muscle pain Nausea Pain, redness, or irritation at injection site This list may not describe all possible side effects. Call your doctor for medical advice about side effects. You may report side effects to FDA at 1-800-FDA-1088. Where should I keep my medication? This medication is given in a hospital or clinic and will not be stored at home. NOTE: This sheet is a summary. It may not cover all possible information. If you have questions about this medicine, talk to your doctor, pharmacist, or health care provider.  2023 Elsevier/Gold Standard (2007-08-20 00:00:00)   

## 2022-04-06 MED FILL — Iron Sucrose Inj 20 MG/ML (Fe Equiv): INTRAVENOUS | Qty: 10 | Status: AC

## 2022-04-07 ENCOUNTER — Inpatient Hospital Stay: Payer: BC Managed Care – PPO

## 2022-04-07 VITALS — BP 127/75 | HR 72 | Temp 97.1°F | Resp 16 | Wt 161.1 lb

## 2022-04-07 DIAGNOSIS — D500 Iron deficiency anemia secondary to blood loss (chronic): Secondary | ICD-10-CM

## 2022-04-07 MED ORDER — SODIUM CHLORIDE 0.9 % IV SOLN
200.0000 mg | Freq: Once | INTRAVENOUS | Status: AC
  Administered 2022-04-07: 200 mg via INTRAVENOUS
  Filled 2022-04-07: qty 200

## 2022-04-07 MED ORDER — SODIUM CHLORIDE 0.9 % IV SOLN
Freq: Once | INTRAVENOUS | Status: AC
  Filled 2022-04-07: qty 250

## 2022-04-13 MED FILL — Iron Sucrose Inj 20 MG/ML (Fe Equiv): INTRAVENOUS | Qty: 10 | Status: AC

## 2022-04-14 ENCOUNTER — Inpatient Hospital Stay: Payer: BC Managed Care – PPO | Attending: Internal Medicine

## 2022-04-14 VITALS — BP 127/69 | HR 73 | Temp 97.2°F | Resp 16

## 2022-04-14 DIAGNOSIS — Z7901 Long term (current) use of anticoagulants: Secondary | ICD-10-CM | POA: Diagnosis not present

## 2022-04-14 DIAGNOSIS — D500 Iron deficiency anemia secondary to blood loss (chronic): Secondary | ICD-10-CM | POA: Diagnosis present

## 2022-04-14 DIAGNOSIS — N1831 Chronic kidney disease, stage 3a: Secondary | ICD-10-CM | POA: Diagnosis not present

## 2022-04-14 MED ORDER — SODIUM CHLORIDE 0.9 % IV SOLN
Freq: Once | INTRAVENOUS | Status: AC
  Filled 2022-04-14: qty 250

## 2022-04-14 MED ORDER — SODIUM CHLORIDE 0.9 % IV SOLN
200.0000 mg | Freq: Once | INTRAVENOUS | Status: AC
  Administered 2022-04-14: 200 mg via INTRAVENOUS
  Filled 2022-04-14: qty 200

## 2022-04-20 MED FILL — Iron Sucrose Inj 20 MG/ML (Fe Equiv): INTRAVENOUS | Qty: 10 | Status: AC

## 2022-04-21 ENCOUNTER — Inpatient Hospital Stay: Payer: BC Managed Care – PPO

## 2022-04-21 VITALS — BP 117/76 | HR 75 | Temp 96.2°F | Resp 18

## 2022-04-21 DIAGNOSIS — D500 Iron deficiency anemia secondary to blood loss (chronic): Secondary | ICD-10-CM | POA: Diagnosis not present

## 2022-04-21 MED ORDER — SODIUM CHLORIDE 0.9 % IV SOLN
INTRAVENOUS | Status: DC
  Filled 2022-04-21: qty 250

## 2022-04-21 MED ORDER — SODIUM CHLORIDE 0.9 % IV SOLN
200.0000 mg | Freq: Once | INTRAVENOUS | Status: AC
  Administered 2022-04-21: 200 mg via INTRAVENOUS
  Filled 2022-04-21: qty 200

## 2022-04-21 NOTE — Patient Instructions (Signed)
[facility]  Discharge Instructions: Thank you for choosing [facility] to provide your oncology and hematology care.  If you have a lab appointment with the Cancer Center, please go directly to the Cancer Center and check in at the registration area.  Wear comfortable clothing and clothing appropriate for easy access to any Portacath or PICC line.   We strive to give you quality time with your provider. You may need to reschedule your appointment if you arrive late (15 or more minutes).  Arriving late affects you and other patients whose appointments are after yours.  Also, if you miss three or more appointments without notifying the office, you may be dismissed from the clinic at the provider's discretion.      For prescription refill requests, have your pharmacy contact our office and allow 72 hours for refills to be completed.    Today you received the following chemotherapy and/or immunotherapy agents Venofer.      To help prevent nausea and vomiting after your treatment, we encourage you to take your nausea medication as directed.  BELOW ARE SYMPTOMS THAT SHOULD BE REPORTED IMMEDIATELY: *FEVER GREATER THAN 100.4 F (38 C) OR HIGHER *CHILLS OR SWEATING *NAUSEA AND VOMITING THAT IS NOT CONTROLLED WITH YOUR NAUSEA MEDICATION *UNUSUAL SHORTNESS OF BREATH *UNUSUAL BRUISING OR BLEEDING *URINARY PROBLEMS (pain or burning when urinating, or frequent urination) *BOWEL PROBLEMS (unusual diarrhea, constipation, pain near the anus) TENDERNESS IN MOUTH AND THROAT WITH OR WITHOUT PRESENCE OF ULCERS (sore throat, sores in mouth, or a toothache) UNUSUAL RASH, SWELLING OR PAIN  UNUSUAL VAGINAL DISCHARGE OR ITCHING   Items with * indicate a potential emergency and should be followed up as soon as possible or go to the Emergency Department if any problems should occur.  Please show the CHEMOTHERAPY ALERT CARD or IMMUNOTHERAPY ALERT CARD at check-in to  the Emergency Department and triage nurse.  Should you have questions after your visit or need to cancel or reschedule your appointment, please contact [facility]  [phone] and follow the prompts.  Office hours are 8:00 a.m. to 4:30 p.m. Monday - Friday. Please note that voicemails left after 4:00 p.m. may not be returned until the following business day.  We are closed weekends and major holidays. You have access to a nurse at all times for urgent questions. Please call the main number to the clinic [phone] and follow the prompts.  For any non-urgent questions, you may also contact your provider using MyChart. We now offer e-Visits for anyone 18 and older to request care online for non-urgent symptoms. For details visit [email or web address].   Also download the MyChart app! Go to the app store, search "MyChart", open the app, select [facility], and log in with your MyChart username and password.  Masks are optional in the cancer centers. If you would like for your care team to wear a mask while they are taking care of you, please let them know. For doctor visits, patients may have with them one support person who is at least 59 years old. At this time, visitors are not allowed in the infusion area.   

## 2022-06-09 ENCOUNTER — Encounter: Payer: Self-pay | Admitting: Nurse Practitioner

## 2022-06-09 ENCOUNTER — Ambulatory Visit: Payer: BC Managed Care – PPO | Attending: Nurse Practitioner | Admitting: Nurse Practitioner

## 2022-06-09 VITALS — BP 132/68 | HR 65 | Ht 67.0 in | Wt 166.6 lb

## 2022-06-09 DIAGNOSIS — D500 Iron deficiency anemia secondary to blood loss (chronic): Secondary | ICD-10-CM | POA: Diagnosis not present

## 2022-06-09 DIAGNOSIS — N183 Chronic kidney disease, stage 3 unspecified: Secondary | ICD-10-CM

## 2022-06-09 DIAGNOSIS — I10 Essential (primary) hypertension: Secondary | ICD-10-CM | POA: Diagnosis not present

## 2022-06-09 DIAGNOSIS — G4730 Sleep apnea, unspecified: Secondary | ICD-10-CM

## 2022-06-09 DIAGNOSIS — I2510 Atherosclerotic heart disease of native coronary artery without angina pectoris: Secondary | ICD-10-CM

## 2022-06-09 DIAGNOSIS — E785 Hyperlipidemia, unspecified: Secondary | ICD-10-CM

## 2022-06-09 NOTE — Progress Notes (Signed)
Office Visit    Patient Name: Jesse Ray Date of Encounter: 06/09/2022  Primary Care Provider:  Adin Hector, MD Primary Cardiologist:  Kate Sable, MD  Chief Complaint    61 year old male with a history of CAD status post LAD stenting, diabetes, hypertension, hyperlipidemia, GERD, stage III chronic kidney disease, arteriovenous malformations, iron deficiency anemia, and psoriatic arthritis, who presents for CAD follow-up.  Past Medical History    Past Medical History:  Diagnosis Date   CKD (chronic kidney disease), stage III ([place])    Coronary artery disease    a. 03/2020 Cardiac CT: Ca2+ 1424 (99th%'ile); b. 03/2020 Cath/PCI: LM nl, LAD 40ost/p, 85p (4.0x18 Resolute Onyx DES), 54m(3.0x18 Resolute Onyx DES), LCX min irregs, OM2 40, RCA 85p - nondominant; c. 06/2020 ETT: ex 7:21. No ECG changes. HTN response; d. 11/2020 MV: No isch/inf. EF 74%.   Diabetes mellitus without complication ([place]    GERD (gastroesophageal reflux disease)    Hyperlipidemia LDL goal <70 04/11/2020   Hypertension    IDA (iron deficiency anemia) 02/11/2021   Localized arteriovenous malformations of intestinal tract    a. 01/2021 s/p APC; b. 10/2021 recurrent melena/anemia-->EGD localized mildly erythematous mucosa without active bleeding, and no stigmata of bleeding in duodenal bulb.   Psoriatic arthritis ([place]    S/P angioplasty with stent 04/10/20 of proximal and mLAD DES and residual LAD stenosis 04/11/2020   Tubular adenoma    Past Surgical History:  Procedure Laterality Date   COLONOSCOPY WITH PROPOFOL N/A 01/23/2021   Procedure: COLONOSCOPY WITH PROPOFOL;  Surgeon: AJonathon Bellows MD;  [facility];  Service: Gastroenterology;  Laterality: N/A;   CORONARY STENT INTERVENTION N/A 04/10/2020   Procedure: CORONARY STENT INTERVENTION;  Surgeon: AWellington Hampshire MD;  [facility];  Service: Cardiovascular;  Laterality: N/A;  LAD    ESOPHAGOGASTRODUODENOSCOPY N/A  01/23/2021   Procedure: ESOPHAGOGASTRODUODENOSCOPY (EGD);  Surgeon: AJonathon Bellows MD;  [facility];  Service: Gastroenterology;  Laterality: N/A;   ESOPHAGOGASTRODUODENOSCOPY N/A 11/14/2021   Procedure: ESOPHAGOGASTRODUODENOSCOPY (EGD);  Surgeon: RAnnamaria Helling DO;  [facility];  Service: Gastroenterology;  Laterality: N/A;   GIVENS CAPSULE STUDY N/A 02/10/2021   Procedure: GIVENS CAPSULE STUDY;  Surgeon: AJonathon Bellows MD;  [facility];  Service: Gastroenterology;  Laterality: N/A;   INTRAVASCULAR ULTRASOUND/IVUS N/A 04/10/2020   Procedure: Intravascular Ultrasound/IVUS;  Surgeon: AWellington Hampshire MD;  [facility];  Service: Cardiovascular;  Laterality: N/A;  LAD   LEFT HEART CATH AND CORONARY ANGIOGRAPHY N/A 04/01/2020   Procedure: LEFT HEART CATH AND CORONARY ANGIOGRAPHY;  Surgeon: AWellington Hampshire MD;  [facility];  Service: Cardiovascular;  Laterality: N/A;    Allergies  Allergies  Allergen Reactions   Atorvastatin Other (See Comments)    Other reaction(s): Muscle Pain    History of Present Illness    61year old male with above past medical history including CAD, hypertension, hyperlipidemia, diabetes, stage III chronic kidney disease, GERD, AVMs, iron deficiency anemia, and psoriatic arthritis.  In September 2021, he underwent coronary calcium scoring, which was elevated at 1424 (99th percentile).  He establish care with Dr. AGaren Lahand also reported exertional chest discomfort.  Diagnostic catheterization was performed and showed severe proximal and mid LAD disease as well as severe disease in a small, nondominant right coronary artery.  [facility] with drug-eluting stent placement to the proximal  and mid LAD.  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  In May 2022, he reported a several month history of exertional chest pressure.  Isosorbide mononitrate was added and a Lexiscan Myoview was performed, and was low risk without evidence of ischemia or infarct.  Unfortunately, he did not tolerate long-acting nitrate therapy and continued to have symptoms.  There was initial plan for diagnostic catheterization however, precath labs revealed anemia with a hemoglobin of 7.1.  He was subsequently admitted to the hospital and transfused.  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  APC was performed.  Colonoscopy showed several polyps, which were resected, and pathology showed tubular adenomas.  He has since establish care with hematology and has received intravenous iron infusions.  Plavix was discontinued in the setting of anemia.  Mr. Rosten had recurrent dark stools in the spring 2023 with drop in H&H to 10.7/32.4.  Aspirin was initially discontinued with slight improvement in H&H.  EGD on Nov 14, 2021, showed localized mild erythematous mucosa without active bleeding or stigmata in the duodenal bulb.  Labs were stable in mid May and he was placed back on Plavix 75 mg daily.  He was last seen in cardiology clinic in May 2023, at which time he was doing well.  He has continued to do well over the past 6 months.  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  He does note some daytime sleepiness.  He is not sure if he snores but does not always wake up feeling very refreshed.  Willing to consider sleep evaluation.  He does not routinely exercise but notes that he is active at work.  He denies chest pain, dyspnea, palpitations, PND, orthopnea, dizziness, syncope, edema, or early satiety.  Home Medications    Current Outpatient Medications  Medication Sig Dispense Refill   acetaminophen (TYLENOL) 325 MG tablet Take 2 tablets (650 mg total) by mouth every 4 (four) hours as needed for headache or mild pain.     Adalimumab 40 MG/0.8ML PNKT  Inject 0.4 mLs into the skin every 14 (fourteen) days.      albuterol (VENTOLIN HFA) 108 (90 Base) MCG/ACT inhaler SMARTSIG:2 Puff(s) By Mouth Every 6 Hours PRN     amLODipine (NORVASC) 10 MG tablet Take 1 tablet (10 mg total) by mouth daily. 90 tablet 3   ascorbic acid (VITAMIN C) 1000 MG tablet Take 1,000 mg by mouth daily.      cetirizine (ZYRTEC) 5 MG tablet Take 10 mg by mouth daily.     Cholecalciferol 25 MCG (1000 UT) tablet Take 1,000 Units by mouth daily.      clobetasol cream (TEMOVATE) 5.63 % Apply 1 application topically 2 (two) times daily as needed (Psoriasis).      clopidogrel (PLAVIX) 75 MG tablet Take 75 mg by mouth daily.     cyanocobalamin 1000 MCG tablet Take by mouth.     Dulaglutide (TRULICITY) 8.75 IE/3.3IR SOPN Inject 1.5 mg into the skin once a week.     FARXIGA 10 MG TABS tablet Take 10 mg by mouth every morning.     Ginkgo Biloba (GINKOBA PO) Take 120 mg by mouth daily.     glimepiride (AMARYL) 4 MG tablet Take 4 mg by mouth in the morning and at bedtime.     losartan (COZAAR) 25 MG tablet Take 25 mg by mouth daily.     metoprolol succinate (TOPROL-XL) 25 MG  24 hr tablet Take 1 tablet (25 mg total) by mouth daily. 90 tablet 0   nitroGLYCERIN (NITROSTAT) 0.4 MG SL tablet Place 1 tablet (0.4 mg total) under the tongue every 5 (five) minutes as needed for chest pain. 25 tablet 4   pantoprazole (PROTONIX) 40 MG tablet Take 1 tablet by mouth twice daily 180 tablet 0   rosuvastatin (CRESTOR) 40 MG tablet TAKE 1 TABLET BY MOUTH AT BEDTIME 90 tablet 0   SYMBICORT 160-4.5 MCG/ACT inhaler Inhale into the lungs as needed.     vitamin E 180 MG (400 UNITS) capsule Take 400 Units by mouth daily.      Zinc 30 MG TABS Take 25 mg by mouth daily.     metFORMIN (GLUCOPHAGE) 1000 MG tablet Take 1 tablet (1,000 mg total) by mouth 2 (two) times daily with a meal. (Patient not taking: Reported on 06/09/2022)     Current Facility-Administered Medications  Medication Dose Route Frequency  Provider Last Rate Last Admin   sodium chloride flush (NS) 0.9 % injection 3 mL  3 mL Intravenous Q12H Kate Sable, MD         Review of Systems    See daytime somnolence.  He denies chest pain, palpitations, dyspnea, pnd, orthopnea, n, v, dizziness, syncope, edema, weight gain, or early satiety.  All other systems reviewed and are otherwise negative except as noted above.    Physical Exam    VS:  BP 132/68   Pulse 65   Ht '5\' 7"'$  (1.702 m)   Wt 166 lb 9.6 oz (75.6 kg)   SpO2 97%   BMI 26.09 kg/m  , BMI Body mass index is 26.09 kg/m.  Today's Vitals   06/09/22 0810 06/09/22 0831  BP: (!) 140/70 132/68  Pulse: 65   SpO2: 97%   Weight: 166 lb 9.6 oz (75.6 kg)   Height: '5\' 7"'$  (1.702 m)    Body mass index is 26.09 kg/m. s STOP-Bang Score:  4      GEN: Well nourished, well developed, in no acute distress. HEENT: normal. Neck: Supple, no JVD, carotid bruits, or masses. Cardiac: RRR, no rubs or gallops. 2/6 syst murmur @ LLSB. No clubbing, cyanosis, edema.  Radials/DP/PT 2+ and equal bilaterally.  Respiratory:  Respirations regular and unlabored, clear to auscultation bilaterally. GI: Soft, nontender, nondistended, BS + x 4. MS: no deformity or atrophy. Skin: warm and dry, no rash. Neuro:  Strength and sensation are intact. Psych: Normal affect.  Accessory Clinical Findings    ECG personally reviewed by me today - RSR, 65, baseline artifact, septal infarct - no acute changes.  Labs dated March 27, 2022 from care everywhere:  Hemoglobin 11.3, hematocrit 34.6, WBC 6.3, platelets 249 Sodium 144, potassium 4.3, chloride 109, CO2 28.5, BUN 30, creatinine 2.3, glucose 155 Calcium 9.5, albumin 4.4, total protein 7.0 Total bilirubin 0.4, alkaline phosphatase 73, AST 19, ALT 23 Hemoglobin A1c 7.1 Total cholesterol 82, triglycerides 115, HDL 36.1, LDL 23  Assessment & Plan    1.  Coronary artery disease: Status post drug-eluting stent placement x2 the LAD with known  residual small vessel RCA disease, which has been medically managed.  He had a Myoview in May 2022, which was nonischemic.  He has been doing well without chest pain or dyspnea remains on beta-blocker, statin, and Plavix therapy.  No aspirin in the setting of AVMs and GI bleeding.  He will be due for an ETT in May for DOT clearance.  2.  Essential hypertension:  Blood pressure initially elevated today but improved on repeat relation.  Continue amlodipine, ARB, and beta-blocker therapy.  3.  Hyperlipidemia: LDL of 23 in September with normal LFTs at that time.  Continue statin therapy.  4.  Stage III chronic kidney disease: Creatinine of 2.3 in September.  Looks like most recent trends have been 2 and above.  He remains on ARB therapy and is followed by nephrology.  5.  Iron deficiency anemia/GI bleed in the setting of arteriovenous malformations: He had been receiving intravenous iron infusions and is due for follow-up lab work in a few weeks.  H&H was stable at 11.3 and 34.6 respectively, in September.  6.  Sleep disordered breathing: Patient notes that about 2 hours after awakening, he becomes fairly tired and can easily fall back to sleep.  His stop bang calculates to 4.  Referring to pulmonology for sleep evaluation.  7.  Disposition: Follow-up in 6 months or sooner if necessary.   Murray Hodgkins, NP 06/09/2022, 12:48 PM

## 2022-06-09 NOTE — Patient Instructions (Signed)
Referral placed for you to see Pulmonary office.   Medication Instructions:  No changes at this time.   *If you need a refill on your cardiac medications before your next appointment, please call your pharmacy*   Lab Work: None  If you have labs (blood work) drawn today and your tests are completely normal, you will receive your results only by: [place] (if you have MyChart) OR A paper copy in the mail If you have any lab test that is abnormal or we need to change your treatment, we will call you to review the results.   Testing/Procedures: None   Follow-Up: At [facility], you and your health needs are our priority.  As part of our continuing mission to provide you with exceptional heart care, we have created designated Provider Care Teams.  These Care Teams include your primary Cardiologist (physician) and Advanced Practice Providers (APPs -  Physician Assistants and Nurse Practitioners) who all work together to provide you with the care you need, when you need it.   Your next appointment:   5 month(s)  The format for your next appointment:   In Person  Provider:   Kate Sable, MD or Murray Hodgkins, NP      Important Information About Sugar

## 2022-06-12 ENCOUNTER — Encounter: Payer: Self-pay | Admitting: Oncology

## 2022-06-19 ENCOUNTER — Ambulatory Visit (INDEPENDENT_AMBULATORY_CARE_PROVIDER_SITE_OTHER): Payer: BC Managed Care – PPO | Admitting: Acute Care

## 2022-06-19 ENCOUNTER — Encounter: Payer: Self-pay | Admitting: Acute Care

## 2022-06-19 ENCOUNTER — Other Ambulatory Visit: Payer: BC Managed Care – PPO

## 2022-06-19 VITALS — BP 112/70 | HR 73 | Temp 98.2°F | Ht 67.0 in | Wt 167.8 lb

## 2022-06-19 DIAGNOSIS — R400 Somnolence: Secondary | ICD-10-CM

## 2022-06-19 DIAGNOSIS — R0683 Snoring: Secondary | ICD-10-CM

## 2022-06-19 MED FILL — Iron Sucrose Inj 20 MG/ML (Fe Equiv): INTRAVENOUS | Qty: 10 | Status: AC

## 2022-06-19 NOTE — Progress Notes (Signed)
History of Present Illness Jesse Ray is a 61 y.o. male with PMH of Hypertension, Asthma, Diabetes,Allergies, and kidney disease. He has    06/19/2022 Pt. Presents for sleep consult. He has daytime fatigue.This has been ongoing for about 12 months.  He initially had iron infusions which helped at first. Blood levels increased.  He then had repeat infusions within the last 2 months as his iron levels dropped. . This is no longer helping. He was seen by cardiology who asked him if he snores. Per the patient he does not, but his wife endorses soft snoring. He is followed by cardiology for CAD. He had 2 stents placed 01/2020. He has one vessel that they are unable to stent. Cardiology wanted him to be evaluated for his chronic daytime sleepiness as they have concern for OSA. Marland KitchenHe is on beta blocker , and the initiation of BB does coincide with fatigue.  He states he feels tired 2 hours after waking up.  He endorses daytime sleepiness . He drives a truck for [employer]. He denies getting sleepy when he drives, but states he needs to be evaluated per work requirements. . He denies any morning headaches.  He has no trouble falling asleep at night.     06/19/2022   12:00 PM  Results of the Epworth flowsheet  Sitting and reading 1  Watching TV 1  Sitting, inactive in a public place (e.g. a theatre or a meeting) 2  As a passenger in a car for an hour without a break 2  Lying down to rest in the afternoon when circumstances permit 2  Sitting and talking to someone 0  Sitting quietly after a lunch without alcohol 1  In a car, while stopped for a few minutes in traffic 0  Total score 9    Denies loud snoring, choking or struggling for breath , denies stopping breathing, or restless sleep, shirt collar is < 17 inches.  He endorses sleepiness during the day with inactivity   Test Results:     No data to display              No data to display              No data to display               Latest Ref Rng & Units 03/17/2022    8:14 AM 08/18/2021    7:58 AM 05/14/2021    8:13 AM  CBC  WBC 4.0 - 10.5 K/uL 6.6  7.3  7.5   Hemoglobin 13.0 - 17.0 g/dL 11.7  11.9  11.8   Hematocrit 39.0 - 52.0 % 34.8  34.7  35.7   Platelets 150 - 400 K/uL 230  230  268        Latest Ref Rng & Units 02/11/2021   10:56 AM 01/20/2021    4:01 AM 01/19/2021    6:53 AM  BMP  Glucose 70 - 99 mg/dL 124  163  62   BUN 6 - 20 mg/dL '26  28  25   '$ Creatinine 0.61 - 1.24 mg/dL 1.53  1.78  1.76   Sodium 135 - 145 mmol/L 140  141  140   Potassium 3.5 - 5.1 mmol/L 5.4  4.2  4.2   Chloride 98 - 111 mmol/L 104  111  112   CO2 22 - 32 mmol/L '27  26  25   '$ Calcium 8.9 - 10.3 mg/dL 9.5  9.1  8.8  BNP No results found for: "BNP"  ProBNP No results found for: "PROBNP"  PFT No results found for: "FEV1PRE", "FEV1POST", "FVCPRE", "FVCPOST", "TLC", "DLCOUNC", "PREFEV1FVCRT", "PSTFEV1FVCRT"  No results found.   Past medical hx Past Medical History:  Diagnosis Date   CKD (chronic kidney disease), stage III ([place])    Coronary artery disease    a. 03/2020 Cardiac CT: Ca2+ 1424 (99th%'ile); b. 03/2020 Cath/PCI: LM nl, LAD 40ost/p, 85p (4.0x18 Resolute Onyx DES), 71m(3.0x18 Resolute Onyx DES), LCX min irregs, OM2 40, RCA 85p - nondominant; c. 06/2020 ETT: ex 7:21. No ECG changes. HTN response; d. 11/2020 MV: No isch/inf. EF 74%.   Diabetes mellitus without complication ([place]    GERD (gastroesophageal reflux disease)    Hyperlipidemia LDL goal <70 04/11/2020   Hypertension    IDA (iron deficiency anemia) 02/11/2021   Localized arteriovenous malformations of intestinal tract    a. 01/2021 s/p APC; b. 10/2021 recurrent melena/anemia-->EGD localized mildly erythematous mucosa without active bleeding, and no stigmata of bleeding in duodenal bulb.   [place]atic [place]    S/P angioplasty with stent 04/10/20 of proximal and mLAD DES and residual LAD stenosis 04/11/2020   Tubular adenoma      Social History    Tobacco Use   Smoking status: Former    Years: 30.00    Types: Cigarettes    Quit date: 2009    Years since quitting: 14.9   Smokeless tobacco: Never  Vaping Use   Vaping Use: Never used  Substance Use Topics   Alcohol use: Yes    Comment: occasional alcohol intake   Drug use: Never    Mr.Miyasaki reports that he quit smoking about 14 years ago. His smoking use included cigarettes. He has never used smokeless tobacco. He reports current alcohol use. He reports that he does not use drugs.  Tobacco Cessation: Former smoker , Quit 2009 with a 30 pack year smoking history   Past surgical hx, Family hx, Social hx all reviewed.  Current Outpatient Medications on File Prior to Visit  Medication Sig   acetaminophen (TYLENOL) 325 MG tablet Take 2 tablets (650 mg total) by mouth every 4 (four) hours as needed for headache or mild pain.   Adalimumab 40 MG/0.8ML PNKT Inject 0.4 mLs into the skin every 14 (fourteen) days.    albuterol (VENTOLIN HFA) 108 (90 Base) MCG/ACT inhaler SMARTSIG:2 Puff(s) By Mouth Every 6 Hours PRN   amLODipine (NORVASC) 10 MG tablet Take 1 tablet (10 mg total) by mouth daily.   ascorbic acid (VITAMIN C) 1000 MG tablet Take 1,000 mg by mouth daily.    cetirizine (ZYRTEC) 5 MG tablet Take 10 mg by mouth daily.   Cholecalciferol 25 MCG (1000 UT) tablet Take 1,000 Units by mouth daily.    clobetasol cream (TEMOVATE) 04.58% Apply 1 application topically 2 (two) times daily as needed (Psoriasis).    clopidogrel (PLAVIX) 75 MG tablet Take 75 mg by mouth daily.   cyanocobalamin 1000 MCG tablet Take by mouth.   Dulaglutide (TRULICITY) 00.99MIP/3.8SNSOPN Inject 1.5 mg into the skin once a week.   FARXIGA 10 MG TABS tablet Take 10 mg by mouth every morning.   Ginkgo Biloba (GINKOBA PO) Take 120 mg by mouth daily.   glimepiride (AMARYL) 4 MG tablet Take 4 mg by mouth in the morning and at bedtime.   losartan (COZAAR) 25 MG tablet Take 25 mg by mouth daily.   metoprolol  succinate (TOPROL-XL) 25 MG 24 hr tablet  Take 1 tablet (25 mg total) by mouth daily.   nitroGLYCERIN (NITROSTAT) 0.4 MG SL tablet Place 1 tablet (0.4 mg total) under the tongue every 5 (five) minutes as needed for chest pain.   pantoprazole (PROTONIX) 40 MG tablet Take 1 tablet by mouth twice daily   rosuvastatin (CRESTOR) 40 MG tablet TAKE 1 TABLET BY MOUTH AT BEDTIME   SYMBICORT 160-4.5 MCG/ACT inhaler Inhale into the lungs as needed.   Zinc 30 MG TABS Take 25 mg by mouth daily.   metFORMIN (GLUCOPHAGE) 1000 MG tablet Take 1 tablet (1,000 mg total) by mouth 2 (two) times daily with a meal. (Patient not taking: Reported on 06/09/2022)   vitamin E 180 MG (400 UNITS) capsule Take 400 Units by mouth daily.    Current Facility-Administered Medications on File Prior to Visit  Medication   sodium chloride flush (NS) 0.9 % injection 3 mL     Allergies  Allergen Reactions   Atorvastatin Other (See Comments)    Other reaction(s): Muscle Pain    Review Of Systems:  Constitutional:   No  weight loss, night sweats,  Fevers, chills, fatigue, or  lassitude.  HEENT:   No headaches,  Difficulty swallowing,  Tooth/dental problems, or  Sore throat,                No sneezing, itching, ear ache, nasal congestion, post nasal drip,   CV:  No chest pain,  Orthopnea, PND, swelling in lower extremities, anasarca, dizziness, palpitations, syncope.   GI  No heartburn, indigestion, abdominal pain, nausea, vomiting, diarrhea, change in bowel habits, loss of appetite, bloody stools.   Resp: No shortness of breath with exertion or at rest.  No excess mucus, no productive cough,  No non-productive cough,  No coughing up of blood.  No change in color of mucus.  No wheezing.  No chest wall deformity  Skin: no rash or lesions.  GU: no dysuria, change in color of urine, no urgency or frequency.  No flank pain, no hematuria   MS:  No joint pain or swelling.  No decreased range of motion.  No back pain.  Psych:   No change in mood or affect. No depression or anxiety.  No memory loss.   Vital Signs BP 112/70 (BP Location: Right Arm, Cuff Size: Normal)   Pulse 73   Temp 98.2 F (36.8 C) (Oral)   Ht '5\' 7"'$  (1.702 m)   Wt 167 lb 12.8 oz (76.1 kg)   SpO2 98%   BMI 26.28 kg/m    Physical Exam:  General- No distress,  A&Ox3, pleasant ENT: No sinus tenderness, TM clear, pale nasal mucosa, no oral exudate,no post nasal drip, No LAD Cardiac: S1, S2, regular rate and rhythm, no murmur Chest: No wheeze/ rales/ dullness; no accessory muscle use, no nasal flaring, no sternal retractions, diminished per bases Abd.: Soft Non-tender, ND, BS +, Body mass index is 26.28 kg/m.  Ext: No clubbing cyanosis, edema Neuro:  normal strength, MAE x 4, A&O x 3 Skin: No rashes, warm and dry, no lesions  Psych: normal mood and behavior    I spent 35 minutes dedicated to the care of this patient on the date of this encounter to include pre-visit review of records, face-to-face time with the patient discussing conditions above, post visit ordering of testing, clinical documentation with the electronic health record, making appropriate referrals as documented, and communicating necessary information to the patient's healthcare team.     Magdalen Spatz, NP  06/19/2022  8:36 AM

## 2022-06-19 NOTE — Patient Instructions (Addendum)
It is good to see you today. We will order a home sleep study to evaluate for Obstructive sleep apnea.  You will get a call to pick up your sleep test machine.  You will pick it up in [place]. They will teach you how to use the device to do the test.  Remember to establish a good bedtime routine, and work on sleep hygiene.  Limit daytime naps , avoid stimulants such as caffeine and nicotine close to bedtime, exercise daily to promote sleep quality, avoid heavy , spicy, fried , or rich foods before bed. Ensure adequate exposure to natural light during the day,establish a relaxing bedtime routine with a pleasant sleep environment ( Bedroom between 60 and 67 degrees, turn off bright lights , TV or device screens screens , consider black out curtains or white noise machines) Do not drive if sleepy. Remember to clean mask, tubing, filter, and reservoir once weekly with soapy water.  Follow up video visit with Judson Roch NP after home  sleep study  Please contact office for sooner follow up if symptoms do not improve or worsen or seek emergency care

## 2022-06-22 ENCOUNTER — Other Ambulatory Visit: Payer: Self-pay

## 2022-06-22 ENCOUNTER — Inpatient Hospital Stay: Payer: BC Managed Care – PPO | Attending: Internal Medicine

## 2022-06-22 DIAGNOSIS — Z79899 Other long term (current) drug therapy: Secondary | ICD-10-CM | POA: Diagnosis not present

## 2022-06-22 DIAGNOSIS — D509 Iron deficiency anemia, unspecified: Secondary | ICD-10-CM | POA: Diagnosis present

## 2022-06-22 DIAGNOSIS — N184 Chronic kidney disease, stage 4 (severe): Secondary | ICD-10-CM | POA: Insufficient documentation

## 2022-06-22 DIAGNOSIS — D500 Iron deficiency anemia secondary to blood loss (chronic): Secondary | ICD-10-CM

## 2022-06-22 DIAGNOSIS — I129 Hypertensive chronic kidney disease with stage 1 through stage 4 chronic kidney disease, or unspecified chronic kidney disease: Secondary | ICD-10-CM | POA: Insufficient documentation

## 2022-06-22 LAB — COMPREHENSIVE METABOLIC PANEL
ALT: 57 U/L — ABNORMAL HIGH (ref 0–44)
AST: 36 U/L (ref 15–41)
Albumin: 4.2 g/dL (ref 3.5–5.0)
Alkaline Phosphatase: 79 U/L (ref 38–126)
Anion gap: 9 (ref 5–15)
BUN: 29 mg/dL — ABNORMAL HIGH (ref 6–20)
CO2: 24 mmol/L (ref 22–32)
Calcium: 9.1 mg/dL (ref 8.9–10.3)
Chloride: 105 mmol/L (ref 98–111)
Creatinine, Ser: 2.49 mg/dL — ABNORMAL HIGH (ref 0.61–1.24)
GFR, Estimated: 29 mL/min — ABNORMAL LOW (ref >60)
Glucose, Bld: 353 mg/dL — ABNORMAL HIGH (ref 70–99)
Potassium: 4.4 mmol/L (ref 3.5–5.1)
Sodium: 138 mmol/L (ref 135–145)
Total Bilirubin: 0.7 mg/dL (ref 0.3–1.2)
Total Protein: 7.6 g/dL (ref 6.5–8.1)

## 2022-06-22 LAB — CBC WITH DIFFERENTIAL/PLATELET
Abs Immature Granulocytes: 0.04 10*3/uL (ref 0.00–0.07)
Basophils Absolute: 0.1 10*3/uL (ref 0.0–0.1)
Basophils Relative: 1 %
Eosinophils Absolute: 0.3 10*3/uL (ref 0.0–0.5)
Eosinophils Relative: 5 %
HCT: 37.5 % — ABNORMAL LOW (ref 39.0–52.0)
Hemoglobin: 13 g/dL (ref 13.0–17.0)
Immature Granulocytes: 1 %
Lymphocytes Relative: 26 %
Lymphs Abs: 1.6 10*3/uL (ref 0.7–4.0)
MCH: 31.3 pg (ref 26.0–34.0)
MCHC: 34.7 g/dL (ref 30.0–36.0)
MCV: 90.1 fL (ref 80.0–100.0)
Monocytes Absolute: 0.5 10*3/uL (ref 0.1–1.0)
Monocytes Relative: 8 %
Neutro Abs: 3.8 10*3/uL (ref 1.7–7.7)
Neutrophils Relative %: 59 %
Platelets: 225 10*3/uL (ref 150–400)
RBC: 4.16 MIL/uL — ABNORMAL LOW (ref 4.22–5.81)
RDW: 12.1 % (ref 11.5–15.5)
WBC: 6.3 10*3/uL (ref 4.0–10.5)
nRBC: 0 % (ref 0.0–0.2)

## 2022-06-22 LAB — IRON AND TIBC
Iron: 98 ug/dL (ref 45–182)
Saturation Ratios: 28 % (ref 17.9–39.5)
TIBC: 349 ug/dL (ref 250–450)
UIBC: 251 ug/dL

## 2022-06-22 LAB — FERRITIN: Ferritin: 124 ng/mL (ref 24–336)

## 2022-06-26 ENCOUNTER — Encounter: Payer: Self-pay | Admitting: Oncology

## 2022-06-26 ENCOUNTER — Inpatient Hospital Stay: Payer: BC Managed Care – PPO

## 2022-06-26 ENCOUNTER — Inpatient Hospital Stay (HOSPITAL_BASED_OUTPATIENT_CLINIC_OR_DEPARTMENT_OTHER): Payer: BC Managed Care – PPO | Admitting: Oncology

## 2022-06-26 VITALS — BP 120/79 | Temp 96.0°F | Wt 164.9 lb

## 2022-06-26 DIAGNOSIS — D500 Iron deficiency anemia secondary to blood loss (chronic): Secondary | ICD-10-CM

## 2022-06-26 DIAGNOSIS — N184 Chronic kidney disease, stage 4 (severe): Secondary | ICD-10-CM | POA: Diagnosis not present

## 2022-06-26 DIAGNOSIS — D509 Iron deficiency anemia, unspecified: Secondary | ICD-10-CM | POA: Diagnosis not present

## 2022-06-26 NOTE — Progress Notes (Signed)
Patient states that he did not notice much difference after his last iron treatment compared to the others. He states that he has no trouble sleeping but never seems to feel rested.

## 2022-06-26 NOTE — Progress Notes (Signed)
Hematology/Oncology Progress note [phone] [phone]      ASSESSMENT & PLAN:   IDA (iron deficiency anemia) Patient has had extensive GI work-up. 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   Labs are reviewed and discussed with patient. No need for Venofer.  Continue oral iron supplementation 2-3 time per week for maintenance if he can tolerate.   CKD (chronic kidney disease) stage 4, GFR 15-29 ml/min [facility]) Patient will see his nephrologist next week.  Avoid nephrotoxins and encourage oral hydration.    Orders Placed This Encounter  Procedures   CBC with Differential/Platelet    Standing Status:   Future    Standing Expiration Date:   06/27/2023   Iron and TIBC    Standing Status:   Future    Standing Expiration Date:   06/27/2023   Ferritin    Standing Status:   Future    Standing Expiration Date:   06/27/2023   Retic Panel    Standing Status:   Future    Standing Expiration Date:   06/27/2023   Follow up in  6 months.  All questions were answered. The patient knows to call the clinic with any problems, questions or concerns.  Earlie Server, MD, PhD [facility] Hematology Oncology 06/26/2022     CHIEF COMPLAINTS/REASON FOR VISIT:  Evaluation of iron deficiency anemia  HISTORY OF PRESENTING ILLNESS:   Jesse Ray is a  61 y.o.  male presents for iron deficiency anemia and CKD 01/18/2021-01/20/2021, patient was admitted after being found to have abnormal lab work which were done in anticipation of upcoming heart catheterization.  Patient has reported shortness of breath and chest pain. Hemoglobin was 7.1 upon admission down from 10.2 at baseline in September 2021.  He was also found to have a creatinine of 1.8.  Iron panel indicate iron deficiency.  Patient was  transfused 2 units of PRBC.  Plavix was held during admission for 5 days.  Aspirin was continued.  Renal ultrasound was obtained during admission was unremarkable.  Patient has a history of CAD, status post PCI with DES in September 2021.  01/24/2021, patient underwent EGD and colonoscopy.  Colonoscopy showed several polyps in the colon which were resected and retrieved.  Pathology showed tubular adenoma, negative high-grade dysplasia or malignancy.  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 02/10/2021, patient underwent capsule study.  Results are pending.  01/23/2021, repeat iron panel confirmed persistently decreased iron deficiency with iron saturation at 8, ferritin 13. Today patient feels fatigued.  Denies any black or bloody stool.  No nausea vomiting diarrhea, no intentional weight loss.  Chest pain has improved after blood transfusion.   INTERVAL HISTORY Jesse Ray is a 61 y.o. male who has above history reviewed by me today presents for follow up visit for iron deficiency anemia,   Patient reports feeling well. No new complaints.   Review of Systems  Constitutional:  Negative for appetite change, chills, fatigue, fever and unexpected weight change.  HENT:   Negative for hearing loss and voice change.   Eyes:  Negative for eye problems and icterus.  Respiratory:  Negative for chest tightness, cough and shortness of breath.   Cardiovascular:  Negative for chest pain and leg swelling.  Gastrointestinal:  Negative for abdominal distention and abdominal pain.  Endocrine: Negative for hot flashes.  Genitourinary:  Negative for difficulty urinating, dysuria and frequency.   Musculoskeletal:  Negative for arthralgias.  Skin:  Negative for itching and rash.  Neurological:  Negative for light-headedness and numbness.  Hematological:  Negative for adenopathy. Does not bruise/bleed easily.  Psychiatric/Behavioral:  Negative for confusion.     MEDICAL  HISTORY:  Past Medical History:  Diagnosis Date   CKD (chronic kidney disease), stage III ([place])    Coronary artery disease    a. 03/2020 Cardiac CT: Ca2+ 1424 (99th%'ile); b. 03/2020 Cath/PCI: LM nl, LAD 40ost/p, 85p (4.0x18 Resolute Onyx DES), 63m(3.0x18 Resolute Onyx DES), LCX min irregs, OM2 40, RCA 85p - nondominant; c. 06/2020 ETT: ex 7:21. No ECG changes. HTN response; d. 11/2020 MV: No isch/inf. EF 74%.   Diabetes mellitus without complication ([place]    GERD (gastroesophageal reflux disease)    Hyperlipidemia LDL goal <70 04/11/2020   Hypertension    IDA (iron deficiency anemia) 02/11/2021   Localized arteriovenous malformations of intestinal tract    a. 01/2021 s/p APC; b. 10/2021 recurrent melena/anemia-->EGD localized mildly erythematous mucosa without active bleeding, and no stigmata of bleeding in duodenal bulb.   Psoriatic arthritis ([place]    S/P angioplasty with stent 04/10/20 of proximal and mLAD DES and residual LAD stenosis 04/11/2020   Tubular adenoma     SURGICAL HISTORY: Past Surgical History:  Procedure Laterality Date   COLONOSCOPY WITH PROPOFOL N/A 01/23/2021   Procedure: COLONOSCOPY WITH PROPOFOL;  Surgeon: AJonathon Bellows MD;  [facility];  Service: Gastroenterology;  Laterality: N/A;   CORONARY STENT INTERVENTION N/A 04/10/2020   Procedure: CORONARY STENT INTERVENTION;  Surgeon: AWellington Hampshire MD;  [facility];  Service: Cardiovascular;  Laterality: N/A;  LAD    ESOPHAGOGASTRODUODENOSCOPY N/A 01/23/2021   Procedure: ESOPHAGOGASTRODUODENOSCOPY (EGD);  Surgeon: AJonathon Bellows MD;  [facility];  Service: Gastroenterology;  Laterality: N/A;   ESOPHAGOGASTRODUODENOSCOPY N/A 11/14/2021   Procedure: ESOPHAGOGASTRODUODENOSCOPY (EGD);  Surgeon: RAnnamaria Helling DO;  [facility];  Service: Gastroenterology;  Laterality: N/A;   GIVENS CAPSULE STUDY N/A 02/10/2021   Procedure: GIVENS CAPSULE STUDY;  Surgeon: AJonathon Bellows MD;   [facility];  Service: Gastroenterology;  Laterality: N/A;   INTRAVASCULAR ULTRASOUND/IVUS N/A 04/10/2020   Procedure: Intravascular Ultrasound/IVUS;  Surgeon: AWellington Hampshire MD;  [place] [facility];  Service: Cardiovascular;  Laterality: N/A;  LAD   LEFT HEART CATH AND CORONARY ANGIOGRAPHY N/A 04/01/2020   Procedure: LEFT HEART CATH AND CORONARY ANGIOGRAPHY;  Surgeon: AWellington Hampshire MD;  [place];  Service: Cardiovascular;  Laterality: N/A;    SOCIAL HISTORY: Social History   Socioeconomic History   Marital status: Married    Spouse name: Not on file   Number of children: Not on file   Years of education: Not on file   Highest education level: Not on file  Occupational History   Not on file  Tobacco Use   Smoking status: Former    Years: 30.00    Types: Cigarettes    Quit date: 2009    Years since quitting: 14.9   Smokeless tobacco: Never  Vaping Use   Vaping Use: Never used  Substance and Sexual Activity   Alcohol use: Yes    Comment: occasional alcohol intake   Drug use: Never   Sexual activity: Yes  Other Topics Concern   Not on file  Social History Narrative   Not on file   Social Determinants of Health  Financial Resource Strain: Not on file  Food Insecurity: Not on file  Transportation Needs: Not on file  Physical Activity: Not on file  Stress: Not on file  Social Connections: Not on file  Intimate Partner Violence: Not on file    FAMILY HISTORY: Family History  Problem Relation Age of Onset   Dementia Mother    Heart disease Father    Throat cancer Maternal Grandfather     ALLERGIES:  is allergic to atorvastatin.  MEDICATIONS:  Current Outpatient Medications  Medication Sig Dispense Refill   acetaminophen (TYLENOL) 325 MG tablet Take 2 tablets (650 mg total) by mouth every 4 (four) hours as needed for headache or mild pain.     Adalimumab 40 MG/0.8ML PNKT Inject 0.4 mLs into the skin every 14  (fourteen) days.      albuterol (VENTOLIN HFA) 108 (90 Base) MCG/ACT inhaler SMARTSIG:2 Puff(s) By Mouth Every 6 Hours PRN     amLODipine (NORVASC) 10 MG tablet Take 1 tablet (10 mg total) by mouth daily. 90 tablet 3   ascorbic acid (VITAMIN C) 1000 MG tablet Take 1,000 mg by mouth daily.      cetirizine (ZYRTEC) 5 MG tablet Take 10 mg by mouth daily.     Cholecalciferol 25 MCG (1000 UT) tablet Take 1,000 Units by mouth daily.      clobetasol cream (TEMOVATE) 6.75 % Apply 1 application topically 2 (two) times daily as needed (Psoriasis).      clopidogrel (PLAVIX) 75 MG tablet Take 75 mg by mouth daily.     cyanocobalamin 1000 MCG tablet Take by mouth.     Dulaglutide (TRULICITY) 9.16 BW/4.6KZ SOPN Inject 1.5 mg into the skin once a week.     FARXIGA 10 MG TABS tablet Take 10 mg by mouth every morning.     Ginkgo Biloba (GINKOBA PO) Take 120 mg by mouth daily.     glimepiride (AMARYL) 4 MG tablet Take 4 mg by mouth in the morning and at bedtime.     HUMIRA PEN 40 MG/0.4ML PNKT SMARTSIG:1 Pre-Filled Pen Syringe SUB-Q Every 2 Weeks     losartan (COZAAR) 25 MG tablet Take 25 mg by mouth daily.     metoprolol succinate (TOPROL-XL) 25 MG 24 hr tablet Take 1 tablet (25 mg total) by mouth daily. 90 tablet 0   nitroGLYCERIN (NITROSTAT) 0.4 MG SL tablet Place 1 tablet (0.4 mg total) under the tongue every 5 (five) minutes as needed for chest pain. 25 tablet 4   pantoprazole (PROTONIX) 40 MG tablet Take 1 tablet by mouth twice daily 180 tablet 0   rosuvastatin (CRESTOR) 40 MG tablet TAKE 1 TABLET BY MOUTH AT BEDTIME 90 tablet 0   SYMBICORT 160-4.5 MCG/ACT inhaler Inhale into the lungs as needed.     Zinc 30 MG TABS Take 25 mg by mouth daily.     Current Facility-Administered Medications  Medication Dose Route Frequency Provider Last Rate Last Admin   sodium chloride flush (NS) 0.9 % injection 3 mL  3 mL Intravenous Q12H Agbor-Etang, Aaron Edelman, MD         PHYSICAL EXAMINATION: ECOG PERFORMANCE STATUS: 1  - Symptomatic but completely ambulatory Vitals:   06/26/22 0923  BP: 120/79  Temp: (!) 96 F (35.6 C)  SpO2: 100%   Filed Weights   06/26/22 0923  Weight: 164 lb 14.4 oz (74.8 kg)    Physical Exam Constitutional:      General: He is not in acute distress. HENT:  Head: Normocephalic and atraumatic.  Eyes:     General: No scleral icterus. Cardiovascular:     Rate and Rhythm: Normal rate and regular rhythm.     Heart sounds: Normal heart sounds.  Pulmonary:     Effort: Pulmonary effort is normal. No respiratory distress.     Breath sounds: No wheezing.  Abdominal:     General: Bowel sounds are normal. There is no distension.     Palpations: Abdomen is soft.  Musculoskeletal:        General: No deformity. Normal range of motion.     Cervical back: Normal range of motion and neck supple.  Skin:    General: Skin is warm and dry.     Findings: No erythema or rash.  Neurological:     Mental Status: He is alert and oriented to person, place, and time. Mental status is at baseline.     Cranial Nerves: No cranial nerve deficit.     Coordination: Coordination normal.  Psychiatric:        Mood and Affect: Mood normal.    LABORATORY DATA:  I have reviewed the data as listed     Latest Ref Rng & Units 06/22/2022    8:45 AM 03/17/2022    8:14 AM 08/18/2021    7:58 AM  CBC  WBC 4.0 - 10.5 K/uL 6.3  6.6  7.3   Hemoglobin 13.0 - 17.0 g/dL 13.0  11.7  11.9   Hematocrit 39.0 - 52.0 % 37.5  34.8  34.7   Platelets 150 - 400 K/uL 225  230  230       Latest Ref Rng & Units 06/22/2022    8:45 AM 02/11/2021   10:56 AM 01/20/2021    4:01 AM  CMP  Glucose 70 - 99 mg/dL 353  124  163   BUN 6 - 20 mg/dL '29  26  28   '$ Creatinine 0.61 - 1.24 mg/dL 2.49  1.53  1.78   Sodium 135 - 145 mmol/L 138  140  141   Potassium 3.5 - 5.1 mmol/L 4.4  5.4  4.2   Chloride 98 - 111 mmol/L 105  104  111   CO2 22 - 32 mmol/L '24  27  26   '$ Calcium 8.9 - 10.3 mg/dL 9.1  9.5  9.1   Total Protein 6.5 - 8.1  g/dL 7.6  7.7    Total Bilirubin 0.3 - 1.2 mg/dL 0.7  0.2    Alkaline Phos 38 - 126 U/L 79  87    AST 15 - 41 U/L 36  46    ALT 0 - 44 U/L 57  40     Lab Results  Component Value Date   IRON 98 06/22/2022   TIBC 349 06/22/2022   FERRITIN 124 06/22/2022       RADIOGRAPHIC STUDIES: I have personally reviewed the radiological images as listed and agreed with the findings in the report. No results found.

## 2022-06-26 NOTE — Assessment & Plan Note (Signed)
Patient will see his nephrologist next week.  Avoid nephrotoxins and encourage oral hydration.

## 2022-06-26 NOTE — Assessment & Plan Note (Addendum)
Patient has had extensive GI work-up. 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   Labs are reviewed and discussed with patient. No need for Venofer.  Continue oral iron supplementation 2-3 time per week for maintenance if he can tolerate.

## 2022-07-03 ENCOUNTER — Telehealth: Payer: Self-pay | Admitting: Nurse Practitioner

## 2022-07-03 MED ORDER — METOPROLOL SUCCINATE ER 25 MG PO TB24: 25.0000 mg | ORAL_TABLET | Freq: Every day | ORAL | 1 refills | Status: DC

## 2022-07-03 NOTE — Telephone Encounter (Signed)
  *  STAT* If patient is at the pharmacy, call can be transferred to refill team.   1. Which medications need to be refilled? (please list name of each medication and dose if known) metoprolol succinate (TOPROL-XL) 25 MG 24 hr tablet   2. Which pharmacy/location (including street and city if local pharmacy) is medication to be sent to?  [place] ([place]), [place] - [address]    3. Do they need a 30 day or 90 day supply? 90 days'  Pt is out of meds and needs refill today. Will go out of town tomorrow morning

## 2022-07-14 ENCOUNTER — Other Ambulatory Visit: Payer: Self-pay | Admitting: Gastroenterology

## 2022-07-28 ENCOUNTER — Institutional Professional Consult (permissible substitution): Payer: BC Managed Care – PPO | Admitting: Internal Medicine

## 2022-08-07 ENCOUNTER — Ambulatory Visit: Payer: BC Managed Care – PPO

## 2022-08-07 DIAGNOSIS — R0683 Snoring: Secondary | ICD-10-CM

## 2022-08-07 DIAGNOSIS — G4733 Obstructive sleep apnea (adult) (pediatric): Secondary | ICD-10-CM

## 2022-08-14 DIAGNOSIS — G4733 Obstructive sleep apnea (adult) (pediatric): Secondary | ICD-10-CM | POA: Diagnosis not present

## 2022-10-30 ENCOUNTER — Encounter: Payer: Self-pay | Admitting: Nurse Practitioner

## 2022-10-30 ENCOUNTER — Ambulatory Visit: Payer: BC Managed Care – PPO | Attending: Nurse Practitioner | Admitting: Nurse Practitioner

## 2022-10-30 VITALS — BP 118/62 | HR 68 | Ht 67.0 in | Wt 163.1 lb

## 2022-10-30 DIAGNOSIS — Z024 Encounter for examination for driving license: Secondary | ICD-10-CM

## 2022-10-30 DIAGNOSIS — I2510 Atherosclerotic heart disease of native coronary artery without angina pectoris: Secondary | ICD-10-CM | POA: Diagnosis not present

## 2022-10-30 DIAGNOSIS — E785 Hyperlipidemia, unspecified: Secondary | ICD-10-CM

## 2022-10-30 DIAGNOSIS — I10 Essential (primary) hypertension: Secondary | ICD-10-CM | POA: Diagnosis not present

## 2022-10-30 DIAGNOSIS — D509 Iron deficiency anemia, unspecified: Secondary | ICD-10-CM

## 2022-10-30 DIAGNOSIS — N183 Chronic kidney disease, stage 3 unspecified: Secondary | ICD-10-CM

## 2022-10-30 NOTE — Patient Instructions (Addendum)
Medication Instructions:  No changes at this time   *If you need a refill on your cardiac medications before your next appointment, please call your pharmacy*   Lab Work: None  If you have labs (blood work) drawn today and your tests are completely normal, you will receive your results only by: MyChart Message (if you have MyChart) OR A paper copy in the mail If you have any lab test that is abnormal or we need to change your treatment, we will call you to review the results.   Testing/Procedures: Exercise Treadmill test needed for DOT clearance.   - you may eat a light breakfast/ lunch prior to your procedure - no caffeine for 24 hours prior to your test (coffee, tea, soft drinks, or chocolate)  - no smoking/ vaping for 4 hours prior to your test - you may take your regular medications the day of your test with small sip of water.  - bring any inhalers with you to your test - wear comfortable clothing & tennis/ non-skid shoes to walk on the treadmill    Follow-Up: At [facility], you and your health needs are our priority.  As part of our continuing mission to provide you with exceptional heart care, we have created designated Provider Care Teams.  These Care Teams include your primary Cardiologist (physician) and Advanced Practice Providers (APPs -  Physician Assistants and Nurse Practitioners) who all work together to provide you with the care you need, when you need it.    Your next appointment:   6 month(s)  Provider:   Debbe Odea, MD

## 2022-10-30 NOTE — Progress Notes (Signed)
Office Visit    Patient Name: Jesse Ray Date of Encounter: 10/30/2022  Primary Care Provider:  Lynnea Ferrier, MD Primary Cardiologist:  Debbe Odea, MD  Chief Complaint    62 year old male with history of CAD status post LAD stent, diabetes, hypertension, hyperlipidemia, GERD, stage III chronic kidney disease, arteriovenous malformations, iron deficiency anemia, and psoriatic arthritis, presents for CAD follow-up.  Past Medical History    Past Medical History:  Diagnosis Date   CKD (chronic kidney disease), stage III    Coronary artery disease    a. 03/2020 Cardiac CT: Ca2+ 1424 (99th%'ile); b. 03/2020 Cath/PCI: LM nl, LAD 40ost/p, 85p (4.0x18 Resolute Onyx DES), 63m (3.0x18 Resolute Onyx DES), LCX min irregs, OM2 40, RCA 85p - nondominant; c. 06/2020 ETT: ex 7:21. No ECG changes. HTN response; d. 11/2020 MV: No isch/inf. EF 74%.   Diabetes mellitus without complication    GERD (gastroesophageal reflux disease)    Hyperlipidemia LDL goal <70 04/11/2020   Hypertension    IDA (iron deficiency anemia) 02/11/2021   Localized arteriovenous malformations of intestinal tract    a. 01/2021 s/p APC; b. 10/2021 recurrent melena/anemia-->EGD localized mildly erythematous mucosa without active bleeding, and no stigmata of bleeding in duodenal bulb.   Psoriatic arthritis    S/P angioplasty with stent 04/10/20 of proximal and mLAD DES and residual LAD stenosis 04/11/2020   Tubular adenoma    Past Surgical History:  Procedure Laterality Date   COLONOSCOPY WITH PROPOFOL N/A 01/23/2021   Procedure: COLONOSCOPY WITH PROPOFOL;  Surgeon: Wyline Mood, MD;  Location: [facility] ENDOSCOPY;  Service: Gastroenterology;  Laterality: N/A;   CORONARY STENT INTERVENTION N/A 04/10/2020   Procedure: CORONARY STENT INTERVENTION;  Surgeon: Iran Ouch, MD;  Location: [facility] INVASIVE CV LAB;  Service: Cardiovascular;  Laterality: N/A;  LAD    CORONARY ULTRASOUND/IVUS N/A 04/10/2020   Procedure:  Intravascular Ultrasound/IVUS;  Surgeon: Iran Ouch, MD;  Location: [facility] INVASIVE CV LAB;  Service: Cardiovascular;  Laterality: N/A;  LAD   ESOPHAGOGASTRODUODENOSCOPY N/A 01/23/2021   Procedure: ESOPHAGOGASTRODUODENOSCOPY (EGD);  Surgeon: Wyline Mood, MD;  Location: [facility] ENDOSCOPY;  Service: Gastroenterology;  Laterality: N/A;   ESOPHAGOGASTRODUODENOSCOPY N/A 11/14/2021   Procedure: ESOPHAGOGASTRODUODENOSCOPY (EGD);  Surgeon: Jaynie Collins, DO;  Location: [facility] ENDOSCOPY;  Service: Gastroenterology;  Laterality: N/A;   GIVENS CAPSULE STUDY N/A 02/10/2021   Procedure: GIVENS CAPSULE STUDY;  Surgeon: Wyline Mood, MD;  Location: [facility] ENDOSCOPY;  Service: Gastroenterology;  Laterality: N/A;   LEFT HEART CATH AND CORONARY ANGIOGRAPHY N/A 04/01/2020   Procedure: LEFT HEART CATH AND CORONARY ANGIOGRAPHY;  Surgeon: Iran Ouch, MD;  Location: [facility] INVASIVE CV LAB;  Service: Cardiovascular;  Laterality: N/A;    Allergies  Allergies  Allergen Reactions   Atorvastatin Other (See Comments)    Other reaction(s): Muscle Pain    History of Present Illness    62 year old male with above past medical history including CAD, hypertension, hyperlipidemia, diabetes, stage III chronic kidney disease, GERD, AVMs, iron deficiency anemia, and psoriatic arthritis.  September 2021, he underwent coronary calcium score, which was elevated at 1424 (99%).  He established care with Dr. Azucena Cecil had also reported exertional chest discomfort.  Diagnostic catheterization showed severe proximal and mid LAD disease as well as severe disease in a small, nondominant right coronary artery.  The patient underwent staged intervention with intravascular ultrasound-guided stenosis, late September 2021, with drug-eluting stent placement to the proximal and mid LAD.  He underwent exercise treadmill testing in December 2021 for DOT  clearance, and this was normal.  In May 2022, he reported a several month history of exertional  chest pressure.  Imdur was added and stress testing was performed and was low risk without evidence of ischemia or infarct.  Unfortunately, he did not tolerate long-acting nitrate therapy and continued to have symptoms.  There was a plan for catheterization however, precath labs revealed anemia with a hemoglobin of 7.1.  He was subsequently admitted to the hospital transfused.  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  APC was performed.  Colonoscopy showed several polyps, which were resected, and pathology showed tubular adenomas.  He has since established care with hematology and has received intravenous iron infusions.  Plavix was discontinued at that time in the setting of anemia.  [name] had recurrent dark stools in the spring 2023 with drop in H&H to 10.7/30.4.  Aspirin was initially discontinued with slight improvement in H&H.  EGD on Nov 14, 2021 showed localized mild erythematous mucosa without active bleeding or stigmata in the duodenal bulb.  Labs are stable in mid May he was placed back on Plavix 75 mg daily.  He was last seen in cardiology clinic in November 2023 at which time he reported daytime somnolence and he was referred to pulmonology for sleep evaluation.  Home sleep study showed mild sleep apnea.  Over the past several months, [name] has felt well.  He continues to have mild discomfort across his chest first thing in the morning when he gets out of bed that seems to improve as he stretches and moves around.  This has been stable over some time.  He remains reasonably active in and around his home and does not experience chest pain or dyspnea with activity.  His daytime somnolence has improved some though he recently started Cymbalta and thinks that might be making him a little bit drowsy in the later afternoons.  He denies palpitations, PND, orthopnea, dizziness, syncope, edema, or early satiety.  Home Medications    Current Outpatient Medications   Medication Sig Dispense Refill   acetaminophen (TYLENOL) 325 MG tablet Take 2 tablets (650 mg total) by mouth every 4 (four) hours as needed for headache or mild pain.     Adalimumab 40 MG/0.8ML PNKT Inject 0.4 mLs into the skin every 14 (fourteen) days.      albuterol (VENTOLIN HFA) 108 (90 Base) MCG/ACT inhaler SMARTSIG:2 Puff(s) By Mouth Every 6 Hours PRN     amLODipine (NORVASC) 10 MG tablet Take 1 tablet (10 mg total) by mouth daily. 90 tablet 3   ascorbic acid (VITAMIN C) 1000 MG tablet Take 1,000 mg by mouth daily.      cetirizine (ZYRTEC) 5 MG tablet Take 10 mg by mouth daily.     Cholecalciferol 25 MCG (1000 UT) tablet Take 1,000 Units by mouth daily.      clobetasol cream (TEMOVATE) 0.05 % Apply 1 application topically 2 (two) times daily as needed (Psoriasis).      clopidogrel (PLAVIX) 75 MG tablet Take 75 mg by mouth daily.     cyanocobalamin 1000 MCG tablet Take by mouth.     Dulaglutide (TRULICITY) 0.75 MG/0.5ML SOPN Inject 1.5 mg into the skin once a week.     DULoxetine (CYMBALTA) 30 MG capsule Take 30 mg by mouth daily.     FARXIGA 10 MG TABS tablet Take 10 mg by mouth every morning.     Ginkgo Biloba (GINKOBA PO) Take 120 mg by mouth  daily.     glimepiride (AMARYL) 4 MG tablet Take 4 mg by mouth in the morning and at bedtime.     HUMIRA PEN 40 MG/0.4ML PNKT SMARTSIG:1 Pre-Filled Pen Syringe SUB-Q Every 2 Weeks     losartan (COZAAR) 25 MG tablet Take 25 mg by mouth daily.     metoprolol succinate (TOPROL-XL) 25 MG 24 hr tablet Take 1 tablet (25 mg total) by mouth daily. 90 tablet 1   nitroGLYCERIN (NITROSTAT) 0.4 MG SL tablet Place 1 tablet (0.4 mg total) under the tongue every 5 (five) minutes as needed for chest pain. 25 tablet 4   pantoprazole (PROTONIX) 40 MG tablet Take 1 tablet by mouth twice daily 180 tablet 0   rosuvastatin (CRESTOR) 40 MG tablet TAKE 1 TABLET BY MOUTH AT BEDTIME 90 tablet 0   SYMBICORT 160-4.5 MCG/ACT inhaler Inhale into the lungs as needed.      Zinc 30 MG TABS Take 25 mg by mouth daily.     Current Facility-Administered Medications  Medication Dose Route Frequency Provider Last Rate Last Admin   sodium chloride flush (NS) 0.9 % injection 3 mL  3 mL Intravenous Q12H Debbe Odea, MD         Review of Systems    Overall feels well.  Chronic, stable pain across his chest first thing in the morning when he gets out of bed which improves with stretching and walking around.  He denies exertional chest pain, dyspnea, palpitations, PND, orthopnea, dizziness, syncope, edema, or early satiety..  All other systems reviewed and are otherwise negative except as noted above.    Physical Exam    VS:  BP 118/62 (BP Location: Left Arm, Patient Position: Sitting, Cuff Size: Normal)   Pulse 68   Ht 5\' 7"  (1.702 m)   Wt 163 lb 2 oz (74 kg)   SpO2 98%   BMI 25.55 kg/m  , BMI Body mass index is 25.55 kg/m. STOP-Bang Score:  4      GEN: Well nourished, well developed, in no acute distress. HEENT: normal. Neck: Supple, no JVD, carotid bruits, or masses. Cardiac: RRR, no murmurs, rubs, or gallops. No clubbing, cyanosis, trace bilateral ankle edema.  Radials 2+/PT 2+ and equal bilaterally.  Respiratory:  Respirations regular and unlabored, clear to auscultation bilaterally. GI: Soft, nontender, nondistended, BS + x 4. MS: no deformity or atrophy. Skin: warm and dry, no rash. Neuro:  Strength and sensation are intact. Psych: Normal affect.  Accessory Clinical Findings    ECG personally reviewed by me today -regular sinus rhythm, 68, septal infarct- no acute changes.  Labs dated September 16, 2022 Care Everywhere:  Hemoglobin 12.7, medic at 37.4, WBC 5.2, platelets 229  Labs dated September 29, 2022 from Care Everywhere:  Sodium 142, potassium 4.1, chloride 108, CO2 29.3, BUN 25, creatinine 1.9, glucose 183 Calcium 9.4, albumin 4.3, total protein 6.9 Total bilirubin 0.5, alkaline phosphatase 76, AST 35, ALT 52 Hemoglobin A1c 7.8 Total  cholesterol 81, triglycerides 97, HDL 38.9, LDL 23 TSH 2.72 Ferritin 85, iron 86  Assessment & Plan    1.  Coronary artery disease: Status post drug-eluting stent placement x 2 to the LAD with known residual small vessel RCA disease, which has been medically managed.  Nonischemic Myoview May 2022.  He has chronic, stable chest pain upon awakening in the morning that improves with walking around and stretching.  He does not experience chest pain or dyspnea throughout his day with exertion.  He remains on beta-blocker,  statin, and Plavix therapy.  No aspirin in setting of AVMs and GI bleeding.  He will be due for ETT in May for DOT clearance, and we can arrange this.  2.  Essential hypertension: Stable/well-controlled on amlodipine, losartan, and beta-blocker therapy.  3.  Hyperlipidemia: LDL of 23 in March with normal LFTs.  Continue statin therapy.  4.  Stage III chronic kidney disease: Creatinine was stable at 1.9 in March.  Continue ARB therapy.  5.  Iron deficiency anemia/GI bleeding/arteriovenous malformations: On oral iron therapy with stable H&H.  6.  Mild sleep apnea: Prior home sleep study.  Has not heard back from pulmonology.  Note some improvement in daytime somnolence.  7.  Disposition: Follow-up exercise treadmill test for DOT clearance.  Follow-up in clinic in 6 months or sooner if necessary.  Nicolasa Ducking, NP 10/30/2022, 8:34 AM

## 2022-11-06 ENCOUNTER — Ambulatory Visit: Payer: BC Managed Care – PPO

## 2022-11-13 ENCOUNTER — Ambulatory Visit: Admission: RE | Admit: 2022-11-13 | Payer: BC Managed Care – PPO | Source: Ambulatory Visit

## 2022-11-18 ENCOUNTER — Telehealth: Payer: Self-pay | Admitting: Nurse Practitioner

## 2022-11-18 NOTE — Telephone Encounter (Signed)
Left voicemail to call Britta Mccreedy at [phone] to schedule Exercise Tolerance Test.

## 2022-11-18 NOTE — Telephone Encounter (Signed)
-----   Message from Lauralee Evener V sent at 10/30/2022 11:28 AM EDT -----  ----- Message ----- From: Bryna Colander, RN Sent: 10/30/2022   9:29 AM EDT To: Dalia Heading; Cv Div Burl Scheduling  Please schedule patient for GXT to be done. Thanks

## 2022-11-20 ENCOUNTER — Telehealth: Payer: Self-pay | Admitting: Nurse Practitioner

## 2022-11-20 NOTE — Telephone Encounter (Signed)
Left voice mail to schedule exercise tolerance test.

## 2022-11-20 NOTE — Telephone Encounter (Signed)
-----   Message from Pilar Ariza V sent at 10/30/2022 11:28 AM EDT -----  ----- Message ----- From: Allen, Pamela S, RN Sent: 10/30/2022   9:29 AM EDT To: Sabrina L Kronbergs; Cv Div Burl Scheduling  Please schedule patient for GXT to be done. Thanks  

## 2022-11-25 NOTE — Telephone Encounter (Signed)
Left voice mail to reschedule exercise tolerance test.

## 2022-12-16 ENCOUNTER — Other Ambulatory Visit: Payer: Self-pay

## 2022-12-16 MED ORDER — METOPROLOL SUCCINATE ER 25 MG PO TB24: 25.0000 mg | ORAL_TABLET | Freq: Every day | ORAL | 1 refills | Status: DC

## 2022-12-16 NOTE — Telephone Encounter (Signed)
Requested Prescriptions   Signed Prescriptions Disp Refills   metoprolol succinate (TOPROL-XL) 25 MG 24 hr tablet 90 tablet 1    Sig: Take 1 tablet (25 mg total) by mouth daily.    Authorizing Provider: Debbe Odea    Ordering User: Guerry Minors

## 2022-12-18 IMAGING — US US RENAL
1 series · 14 of 25 positions shown · non-contrast
Comparison: None.

CLINICAL DATA: 60-year-old male with acute renal insufficiency.

EXAM:
RENAL / URINARY TRACT ULTRASOUND COMPLETE

[Series 1: us renal · 14 of 46 slices shown]
[im 1/46]
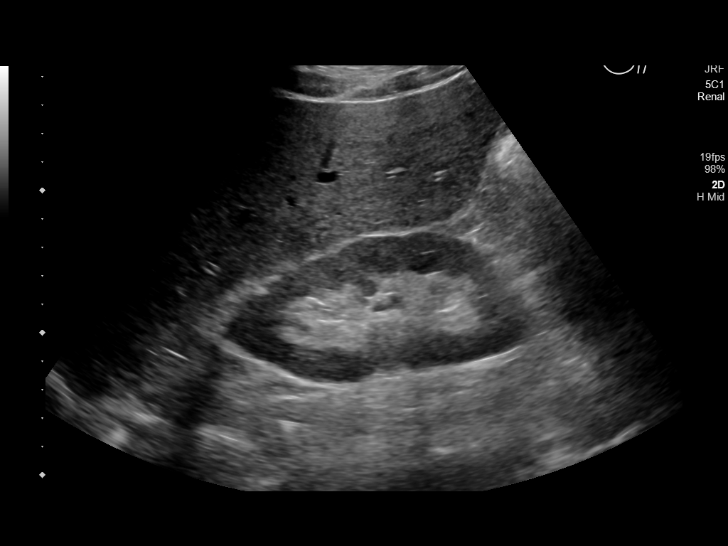
[im 4/46]
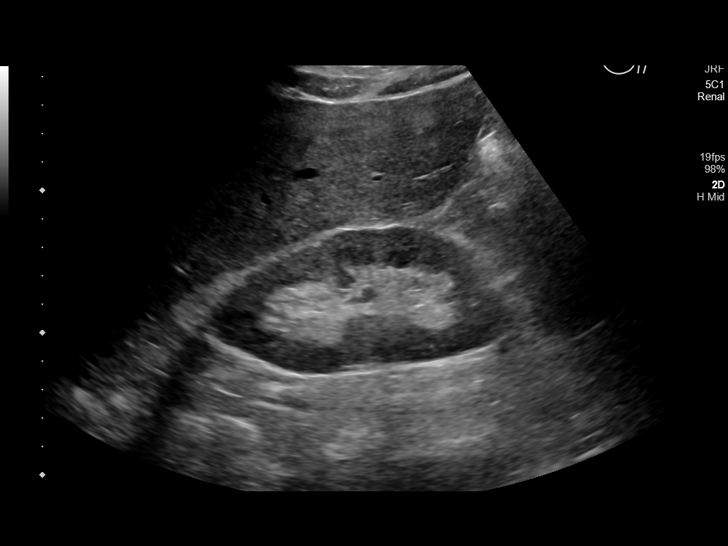
[im 8/46]
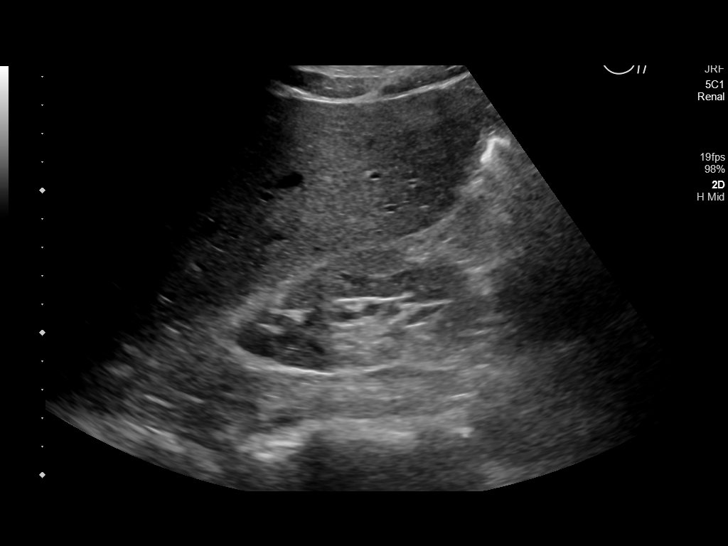
[im 12/46]
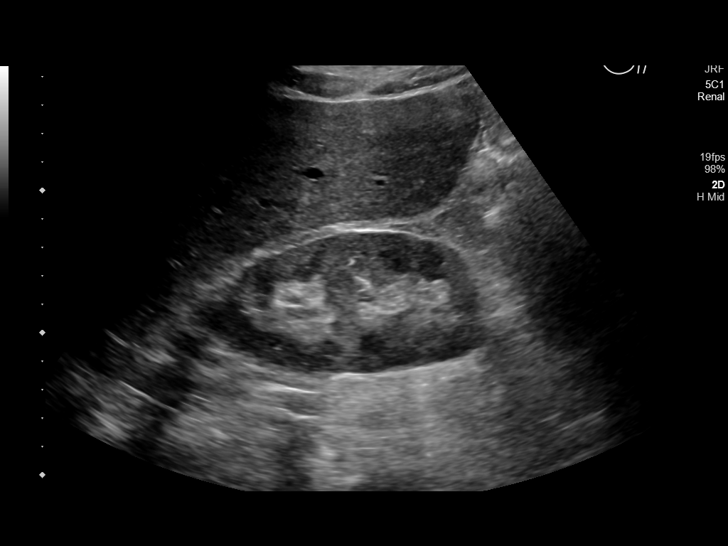
[im 16/46]
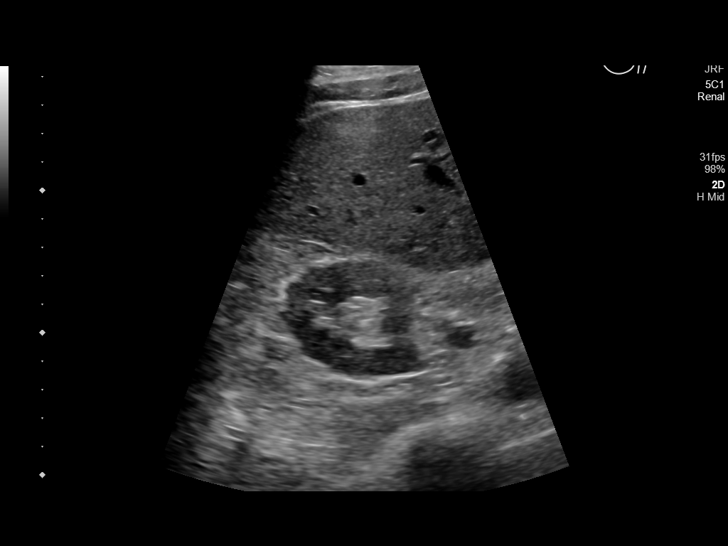
[im 17/46]
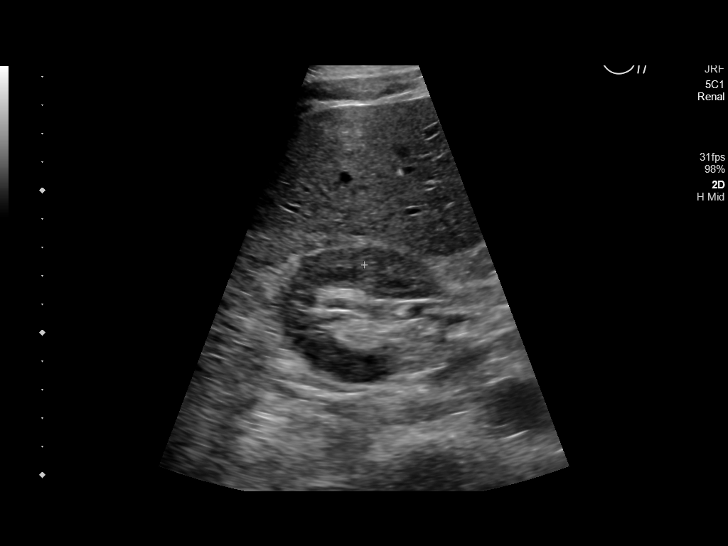
[im 21/46]
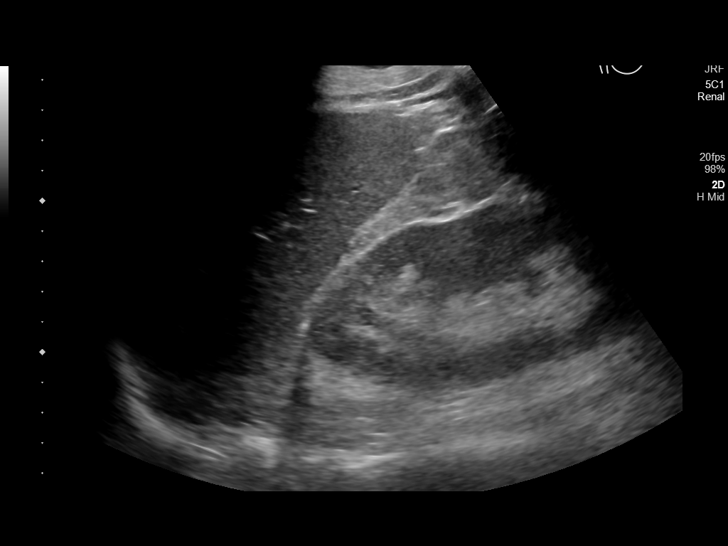
[im 25/46]
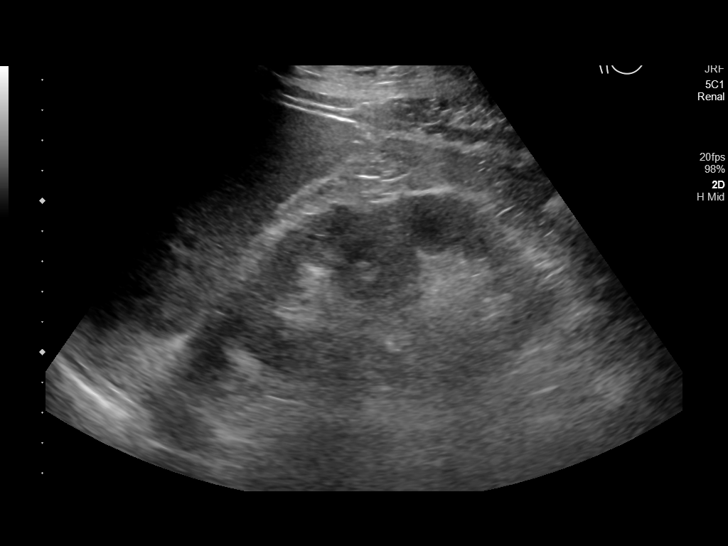
[im 29/46]
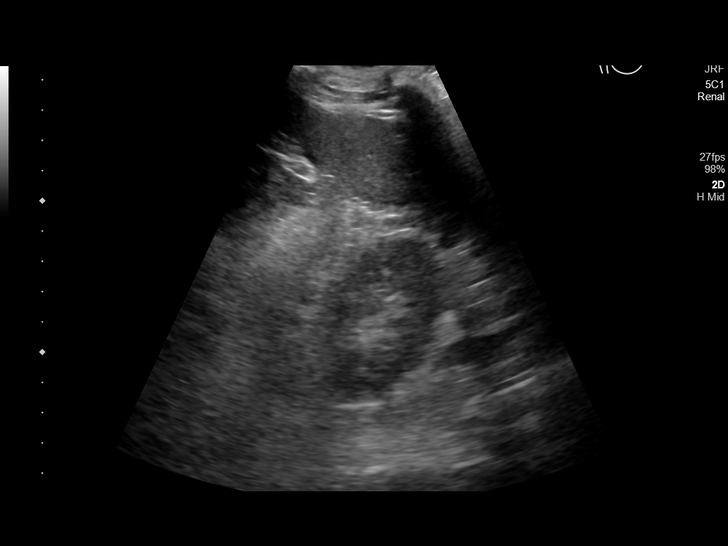
[im 31/46]
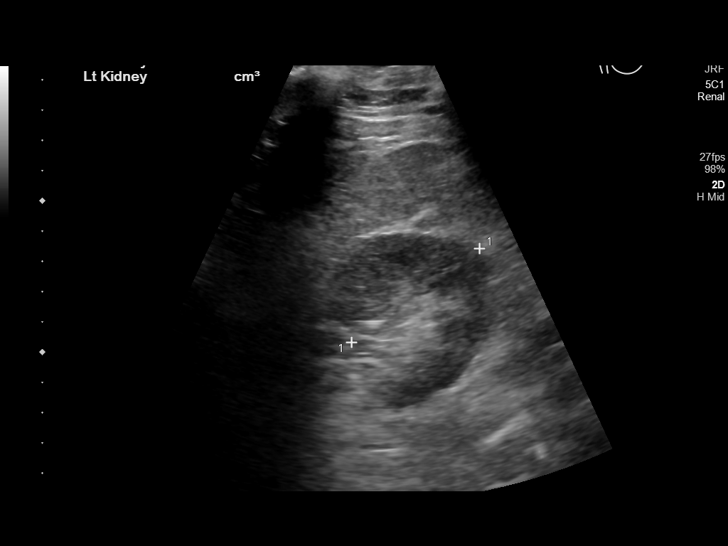
[im 34/46]
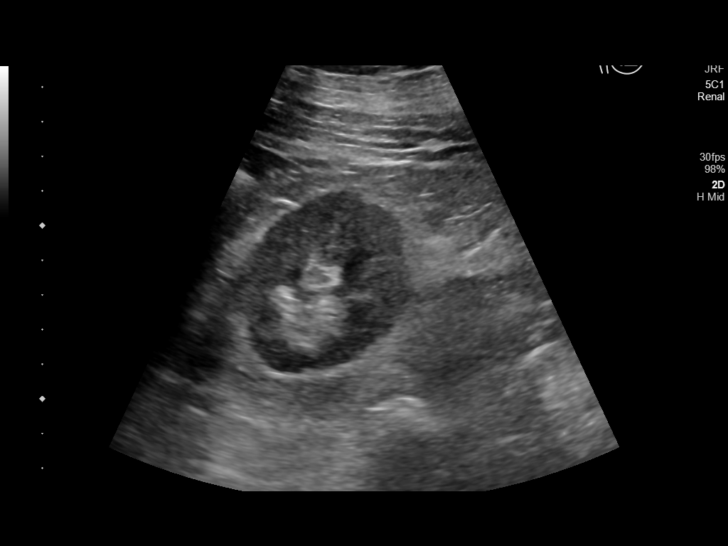
[im 38/46]
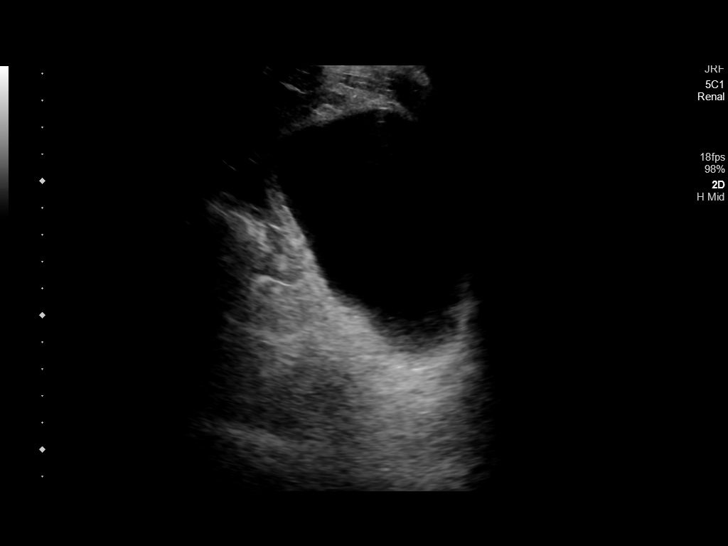
[im 42/46]
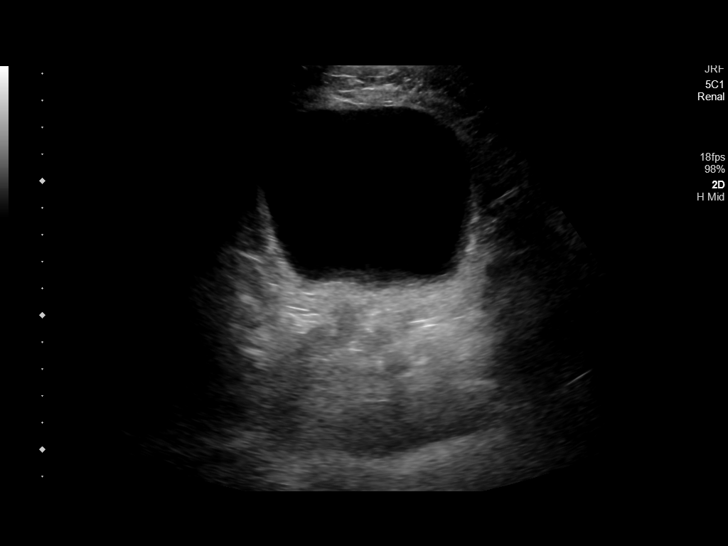
[im 46/46]
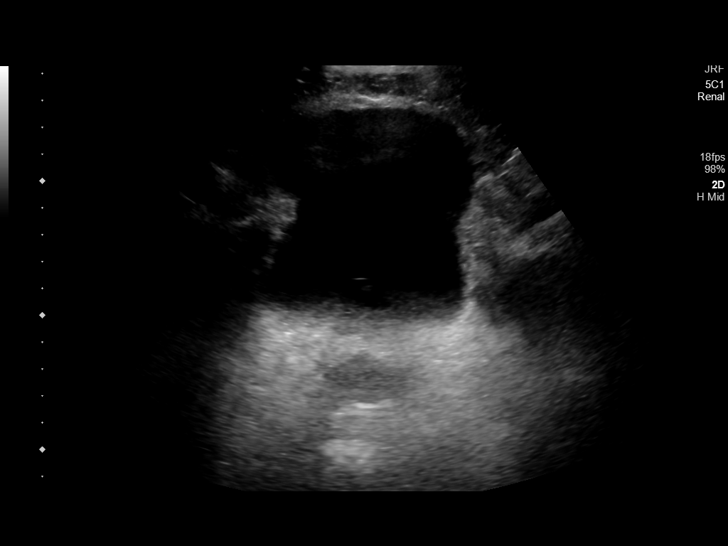

[14 of 25 positions shown; findings below may reference images not displayed]

FINDINGS: Right Kidney:

Renal measurements: 11.0 x 4.9 x 5.4 cm = volume: 152 mL.
Echogenicity within normal limits. No mass or hydronephrosis
visualized.

Left Kidney:

Renal measurements: 11.6 x 5.9 x 5.2 cm = volume: 188 mL.
Echogenicity within normal limits. No mass or hydronephrosis
visualized.

Bladder:

Appears normal for degree of bladder distention. Bilateral ureteral
jets noted.

Other:

The prostate gland measures 4.0 x 3.7 x 4.2 cm for a volume of 32
cc.
IMPRESSION: Unremarkable renal ultrasound.

## 2022-12-28 ENCOUNTER — Other Ambulatory Visit: Payer: BC Managed Care – PPO

## 2022-12-30 ENCOUNTER — Ambulatory Visit: Payer: BC Managed Care – PPO | Admitting: Oncology

## 2022-12-30 ENCOUNTER — Ambulatory Visit: Payer: BC Managed Care – PPO

## 2023-05-06 ENCOUNTER — Other Ambulatory Visit: Payer: Self-pay | Admitting: *Deleted

## 2023-05-06 NOTE — Telephone Encounter (Signed)
Patient wants to call back with calendar available

## 2023-06-22 ENCOUNTER — Other Ambulatory Visit: Payer: Self-pay

## 2023-06-22 MED ORDER — METOPROLOL SUCCINATE ER 25 MG PO TB24: 25.0000 mg | ORAL_TABLET | Freq: Every day | ORAL | 0 refills | Status: DC

## 2023-06-22 NOTE — Telephone Encounter (Signed)
Requested Prescriptions   Signed Prescriptions Disp Refills   metoprolol succinate (TOPROL-XL) 25 MG 24 hr tablet 90 tablet 0    Sig: Take 1 tablet (25 mg total) by mouth daily. Overdue follow up visit.  PLEASE CALL OFFICE TO SCHEDULE APPOINTMENT PRIOR TO NEXT REFILL    Authorizing Provider: Debbe Odea    Ordering User: Guerry Minors

## 2023-06-22 NOTE — Telephone Encounter (Signed)
Last visit 10/30/22 with plan to f/u in 6 months  Next visit: none/active recall  Please schedule follow up appt, Thanks!

## 2023-06-29 NOTE — Telephone Encounter (Signed)
Left voicemail to return call to schedule follow up.

## 2023-07-05 NOTE — Telephone Encounter (Signed)
Left voicemail to return call to schedule follow up.

## 2023-07-12 ENCOUNTER — Encounter: Payer: Self-pay | Admitting: Cardiology

## 2023-07-12 NOTE — Telephone Encounter (Signed)
Called 3x left voicemail. Unable to reach letter sent via mail.

## 2023-08-31 ENCOUNTER — Other Ambulatory Visit: Payer: Self-pay | Admitting: Student

## 2023-08-31 DIAGNOSIS — M7581 Other shoulder lesions, right shoulder: Secondary | ICD-10-CM

## 2023-08-31 DIAGNOSIS — M25811 Other specified joint disorders, right shoulder: Secondary | ICD-10-CM

## 2023-09-20 ENCOUNTER — Other Ambulatory Visit: Payer: Self-pay

## 2023-09-20 MED ORDER — METOPROLOL SUCCINATE ER 25 MG PO TB24: 25.0000 mg | ORAL_TABLET | Freq: Every day | ORAL | 0 refills | Status: DC

## 2023-11-29 ENCOUNTER — Encounter: Payer: Self-pay | Admitting: Student

## 2023-12-02 ENCOUNTER — Ambulatory Visit
Admission: RE | Admit: 2023-12-02 | Discharge: 2023-12-02 | Disposition: A | Discharge status: Home / Self Care | Source: Ambulatory Visit | Attending: Student | Admitting: Student

## 2023-12-02 ENCOUNTER — Encounter: Payer: Self-pay | Admitting: Oncology

## 2023-12-02 DIAGNOSIS — M7581 Other shoulder lesions, right shoulder: Secondary | ICD-10-CM

## 2023-12-02 DIAGNOSIS — M25811 Other specified joint disorders, right shoulder: Secondary | ICD-10-CM

## 2023-12-20 ENCOUNTER — Encounter: Payer: Self-pay | Admitting: Cardiology

## 2023-12-21 ENCOUNTER — Other Ambulatory Visit: Payer: Self-pay | Admitting: Cardiology

## 2024-01-23 ENCOUNTER — Other Ambulatory Visit: Payer: Self-pay | Admitting: Cardiology
